# Patient Record
Sex: Male | Born: 1955 | Race: White | Hispanic: No | Marital: Married | Smoking: Former smoker
Health system: Southern US, Community
[De-identification: ages and names within clinical notes are randomized; demographics above are authoritative.]

## PROBLEM LIST (undated history)

## (undated) DIAGNOSIS — C801 Malignant (primary) neoplasm, unspecified: Secondary | ICD-10-CM

## (undated) DIAGNOSIS — I739 Peripheral vascular disease, unspecified: Secondary | ICD-10-CM

## (undated) DIAGNOSIS — M359 Systemic involvement of connective tissue, unspecified: Secondary | ICD-10-CM

## (undated) DIAGNOSIS — E119 Type 2 diabetes mellitus without complications: Secondary | ICD-10-CM

## (undated) DIAGNOSIS — Z87898 Personal history of other specified conditions: Secondary | ICD-10-CM

---

## 2003-09-01 ENCOUNTER — Emergency Department (HOSPITAL_COMMUNITY): Admission: EM | Admit: 2003-09-01 | Discharge: 2003-09-01 | Payer: Self-pay | Admitting: Emergency Medicine

## 2014-05-09 ENCOUNTER — Encounter (HOSPITAL_COMMUNITY): Payer: Self-pay | Admitting: Emergency Medicine

## 2014-05-09 ENCOUNTER — Emergency Department (HOSPITAL_COMMUNITY)
Admission: EM | Admit: 2014-05-09 | Discharge: 2014-05-09 | Disposition: A | Discharge status: Home / Self Care | Payer: Self-pay | Attending: Emergency Medicine | Admitting: Emergency Medicine

## 2014-05-09 DIAGNOSIS — R55 Syncope and collapse: Secondary | ICD-10-CM | POA: Insufficient documentation

## 2014-05-09 DIAGNOSIS — Z720 Tobacco use: Secondary | ICD-10-CM | POA: Insufficient documentation

## 2014-05-09 LAB — BASIC METABOLIC PANEL WITH GFR
Anion gap: 8 (ref 5–15)
BUN: 9 mg/dL (ref 6–23)
CO2: 25 mmol/L (ref 19–32)
Calcium: 8.7 mg/dL (ref 8.4–10.5)
Chloride: 101 meq/L (ref 96–112)
Creatinine, Ser: 0.73 mg/dL (ref 0.50–1.35)
GFR calc Af Amer: >90 mL/min (ref >90)
GFR calc non Af Amer: >90 mL/min (ref >90)
Glucose, Bld: 115 mg/dL — ABNORMAL HIGH (ref 70–99)
Potassium: 4.4 mmol/L (ref 3.5–5.1)
Sodium: 134 mmol/L — ABNORMAL LOW (ref 135–145)

## 2014-05-09 LAB — CBC WITH DIFFERENTIAL/PLATELET
BASOS PCT: 0 % (ref 0–1)
Basophils Absolute: 0 10*3/uL (ref 0.0–0.1)
EOS PCT: 0 % (ref 0–5)
Eosinophils Absolute: 0.1 10*3/uL (ref 0.0–0.7)
HEMATOCRIT: 39.5 % (ref 39.0–52.0)
HEMOGLOBIN: 13.4 g/dL (ref 13.0–17.0)
LYMPHS ABS: 1.3 10*3/uL (ref 0.7–4.0)
LYMPHS PCT: 8 % — AB (ref 12–46)
MCH: 30.7 pg (ref 26.0–34.0)
MCHC: 33.9 g/dL (ref 30.0–36.0)
MCV: 90.6 fL (ref 78.0–100.0)
MONO ABS: 0.9 10*3/uL (ref 0.1–1.0)
MONOS PCT: 6 % (ref 3–12)
Neutro Abs: 13.2 10*3/uL — ABNORMAL HIGH (ref 1.7–7.7)
Neutrophils Relative %: 86 % — ABNORMAL HIGH (ref 43–77)
PLATELETS: 234 10*3/uL (ref 150–400)
RBC: 4.36 MIL/uL (ref 4.22–5.81)
RDW: 13.7 % (ref 11.5–15.5)
WBC: 15.4 10*3/uL — AB (ref 4.0–10.5)

## 2014-05-09 MED ORDER — SODIUM CHLORIDE 0.9 % IV BOLUS (SEPSIS)
1000.0000 mL | Freq: Once | INTRAVENOUS | Status: AC
  Administered 2014-05-09: 1000 mL via INTRAVENOUS

## 2014-05-09 NOTE — ED Notes (Signed)
Per EMS-states syncopal episode around 1200-states he walked to store felt dizzy and fell backwards and "passed out"-IV placed and fluids started-heavy smoker, homeless

## 2014-05-09 NOTE — Discharge Instructions (Signed)
Get plenty of rest and drink plenty of fluids the next several days.  Return to the emergency department if you develop any new and concerning symptoms.   Syncope Syncope is a medical term for fainting or passing out. This means you lose consciousness and drop to the ground. People are generally unconscious for less than 5 minutes. You may have some muscle twitches for up to 15 seconds before waking up and returning to normal. Syncope occurs more often in older adults, but it can happen to anyone. While most causes of syncope are not dangerous, syncope can be a sign of a serious medical problem. It is important to seek medical care.  CAUSES  Syncope is caused by a sudden drop in blood flow to the brain. The specific cause is often not determined. Factors that can bring on syncope include:  Taking medicines that lower blood pressure.  Sudden changes in posture, such as standing up quickly.  Taking more medicine than prescribed.  Standing in one place for too long.  Seizure disorders.  Dehydration and excessive exposure to heat.  Low blood sugar (hypoglycemia).  Straining to have a bowel movement.  Heart disease, irregular heartbeat, or other circulatory problems.  Fear, emotional distress, seeing blood, or severe pain. SYMPTOMS  Right before fainting, you may:  Feel dizzy or light-headed.  Feel nauseous.  See all white or all black in your field of vision.  Have cold, clammy skin. DIAGNOSIS  Your health care provider will ask about your symptoms, perform a physical exam, and perform an electrocardiogram (ECG) to record the electrical activity of your heart. Your health care provider may also perform other heart or blood tests to determine the cause of your syncope which may include:  Transthoracic echocardiogram (TTE). During echocardiography, sound waves are used to evaluate how blood flows through your heart.  Transesophageal echocardiogram (TEE).  Cardiac monitoring.  This allows your health care provider to monitor your heart rate and rhythm in real time.  Holter monitor. This is a portable device that records your heartbeat and can help diagnose heart arrhythmias. It allows your health care provider to track your heart activity for several days, if needed.  Stress tests by exercise or by giving medicine that makes the heart beat faster. TREATMENT  In most cases, no treatment is needed. Depending on the cause of your syncope, your health care provider may recommend changing or stopping some of your medicines. HOME CARE INSTRUCTIONS  Have someone stay with you until you feel stable.  Do not drive, use machinery, or play sports until your health care provider says it is okay.  Keep all follow-up appointments as directed by your health care provider.  Lie down right away if you start feeling like you might faint. Breathe deeply and steadily. Wait until all the symptoms have passed.  Drink enough fluids to keep your urine clear or pale yellow.  If you are taking blood pressure or heart medicine, get up slowly and take several minutes to sit and then stand. This can reduce dizziness. SEEK IMMEDIATE MEDICAL CARE IF:   You have a severe headache.  You have unusual pain in the chest, abdomen, or back.  You are bleeding from your mouth or rectum, or you have black or tarry stool.  You have an irregular or very fast heartbeat.  You have pain with breathing.  You have repeated fainting or seizure-like jerking during an episode.  You faint when sitting or lying down.  You have confusion.  You have trouble walking.  You have severe weakness.  You have vision problems. If you fainted, call your local emergency services (911 in [place] Do not drive yourself to the hospital.  MAKE SURE YOU:  Understand these instructions.  Will watch your condition.  Will get help right away if you are not doing well or get worse. Document Released: 04/06/2005  Document Revised: 04/11/2013 Document Reviewed: 06/05/2011 [facility] Patient Information 2015 [place], [place]. This information is not intended to replace advice given to you by your health care provider. Make sure you discuss any questions you have with your health care provider.

## 2014-05-09 NOTE — ED Provider Notes (Signed)
CSN: [id number]     Arrival date & time 05/09/14  1253 History   First MD Initiated Contact with Patient 05/09/14 1317     Chief Complaint  Patient presents with  . Near Syncope     (Consider location/radiation/quality/duration/timing/severity/associated sxs/prior Treatment) HPI Comments: Patient is a 59 year old male with no significant past medical history. He presents with complaints of a syncopal episode. He states that he was in a store and walked outside and sat down in the cold when he suddenly "fell out". He had a several second loss of consciousness and woke up shortly thereafter. He denies any chest pain, palpitations, dizziness, shortness of breath, or other complaints.  Patient is a 59 y.o. male presenting with near-syncope. The history is provided by the patient.  Near Syncope This is a new problem. The current episode started less than 1 hour ago. The problem occurs constantly. The problem has not changed since onset.Pertinent negatives include no chest pain, no abdominal pain and no shortness of breath. Nothing aggravates the symptoms. Nothing relieves the symptoms. He has tried nothing for the symptoms. The treatment provided no relief.    History reviewed. No pertinent past medical history. History reviewed. No pertinent past surgical history. No family history on file. History  Substance Use Topics  . Smoking status: Current Every Day Smoker  . Smokeless tobacco: Not on file  . Alcohol Use: No    Review of Systems  Respiratory: Negative for shortness of breath.   Cardiovascular: Positive for near-syncope. Negative for chest pain.  Gastrointestinal: Negative for abdominal pain.  All other systems reviewed and are negative.     Allergies  Review of patient's allergies indicates no known allergies.  Home Medications   Prior to Admission medications   Not on File   BP 119/67 mmHg  Pulse 73  Temp(Src) 97.7 F (36.5 C) (Oral)  Resp 20  SpO2 93% Physical  Exam  Constitutional: He is oriented to person, place, and time. He appears well-developed and well-nourished. No distress.  HENT:  Head: Normocephalic and atraumatic.  Mouth/Throat: Oropharynx is clear and moist.  Eyes: EOM are normal. Pupils are equal, round, and reactive to light.  Neck: Normal range of motion. Neck supple.  Cardiovascular: Normal rate, regular rhythm and normal heart sounds.   No murmur heard. Pulmonary/Chest: Effort normal and breath sounds normal. No respiratory distress. He has no wheezes.  Abdominal: Soft. Bowel sounds are normal. He exhibits no distension. There is no tenderness.  Musculoskeletal: Normal range of motion. He exhibits no edema.  Lymphadenopathy:    He has no cervical adenopathy.  Neurological: He is alert and oriented to person, place, and time. No cranial nerve deficit. He exhibits normal muscle tone. Coordination normal.  Skin: Skin is warm and dry. He is not diaphoretic.  Nursing note and vitals reviewed.   ED Course  Procedures (including critical care time) Labs Review Labs Reviewed  CBC WITH DIFFERENTIAL  BASIC METABOLIC PANEL    Imaging Review No results found.   Date: 05/09/2014  Rate: 75  Rhythm: normal sinus rhythm  QRS Axis: normal  Intervals: normal  ST/T Wave abnormalities: normal  Conduction Disutrbances:none  Narrative Interpretation:   Old EKG Reviewed: none available    MDM   Final diagnoses:  None    Patient is a 59 year old male who presents for evaluation of a syncopal episode that occurred this afternoon. This appears to be some form of vasovagal syncope as I cannot find another etiology. His EKG, labs  are unremarkable. At this point I feel as though he is appropriate for discharge, to follow-up as needed for any problems.    Veryl Speak, MD 05/09/14 [phone]

## 2014-05-09 NOTE — ED Notes (Signed)
Bed: YT03 Expected date:  Expected time:  Means of arrival:  Comments: EMS-syncope

## 2014-05-13 ENCOUNTER — Encounter (HOSPITAL_COMMUNITY): Payer: Self-pay | Admitting: Physical Medicine and Rehabilitation

## 2014-05-13 ENCOUNTER — Observation Stay (HOSPITAL_COMMUNITY)
Admission: EM | Admit: 2014-05-13 | Discharge: 2014-05-16 | Disposition: A | Discharge status: Home / Self Care | Payer: Medicaid Other | Attending: Internal Medicine | Admitting: Internal Medicine

## 2014-05-13 ENCOUNTER — Emergency Department (HOSPITAL_COMMUNITY): Payer: Medicaid Other

## 2014-05-13 ENCOUNTER — Observation Stay (HOSPITAL_COMMUNITY): Payer: Medicaid Other

## 2014-05-13 DIAGNOSIS — D649 Anemia, unspecified: Secondary | ICD-10-CM | POA: Diagnosis not present

## 2014-05-13 DIAGNOSIS — I745 Embolism and thrombosis of iliac artery: Secondary | ICD-10-CM | POA: Diagnosis not present

## 2014-05-13 DIAGNOSIS — I70211 Atherosclerosis of native arteries of extremities with intermittent claudication, right leg: Secondary | ICD-10-CM | POA: Diagnosis not present

## 2014-05-13 DIAGNOSIS — E43 Unspecified severe protein-calorie malnutrition: Secondary | ICD-10-CM | POA: Insufficient documentation

## 2014-05-13 DIAGNOSIS — D72829 Elevated white blood cell count, unspecified: Secondary | ICD-10-CM | POA: Diagnosis not present

## 2014-05-13 DIAGNOSIS — F17210 Nicotine dependence, cigarettes, uncomplicated: Secondary | ICD-10-CM | POA: Diagnosis not present

## 2014-05-13 DIAGNOSIS — R739 Hyperglycemia, unspecified: Secondary | ICD-10-CM | POA: Diagnosis not present

## 2014-05-13 DIAGNOSIS — R55 Syncope and collapse: Secondary | ICD-10-CM | POA: Diagnosis not present

## 2014-05-13 DIAGNOSIS — R05 Cough: Secondary | ICD-10-CM | POA: Diagnosis not present

## 2014-05-13 DIAGNOSIS — M549 Dorsalgia, unspecified: Secondary | ICD-10-CM

## 2014-05-13 DIAGNOSIS — E1165 Type 2 diabetes mellitus with hyperglycemia: Secondary | ICD-10-CM | POA: Diagnosis not present

## 2014-05-13 DIAGNOSIS — R918 Other nonspecific abnormal finding of lung field: Secondary | ICD-10-CM

## 2014-05-13 DIAGNOSIS — Z23 Encounter for immunization: Secondary | ICD-10-CM | POA: Diagnosis not present

## 2014-05-13 HISTORY — DX: Personal history of other specified conditions: Z87.898

## 2014-05-13 LAB — BASIC METABOLIC PANEL
Anion gap: 7 (ref 5–15)
BUN: 10 mg/dL (ref 6–23)
CO2: 27 mmol/L (ref 19–32)
CREATININE: 0.8 mg/dL (ref 0.50–1.35)
Calcium: 8.6 mg/dL (ref 8.4–10.5)
Chloride: 101 mmol/L (ref 96–112)
GFR calc Af Amer: >90 mL/min (ref >90)
Glucose, Bld: 151 mg/dL — ABNORMAL HIGH (ref 70–99)
POTASSIUM: 3.8 mmol/L (ref 3.5–5.1)
SODIUM: 135 mmol/L (ref 135–145)

## 2014-05-13 LAB — I-STAT CHEM 8, ED
BUN: 12 mg/dL (ref 6–23)
CALCIUM ION: 1.19 mmol/L (ref 1.12–1.23)
CHLORIDE: 99 mmol/L (ref 96–112)
Creatinine, Ser: 0.7 mg/dL (ref 0.50–1.35)
Glucose, Bld: 151 mg/dL — ABNORMAL HIGH (ref 70–99)
HCT: 43 % (ref 39.0–52.0)
HEMOGLOBIN: 14.6 g/dL (ref 13.0–17.0)
POTASSIUM: 3.8 mmol/L (ref 3.5–5.1)
Sodium: 137 mmol/L (ref 135–145)
TCO2: 22 mmol/L (ref 0–100)

## 2014-05-13 LAB — URINALYSIS, ROUTINE W REFLEX MICROSCOPIC
BILIRUBIN URINE: NEGATIVE
Glucose, UA: NEGATIVE mg/dL
HGB URINE DIPSTICK: NEGATIVE
Ketones, ur: NEGATIVE mg/dL
Leukocytes, UA: NEGATIVE
NITRITE: NEGATIVE
PH: 6 (ref 5.0–8.0)
Protein, ur: NEGATIVE mg/dL
Specific Gravity, Urine: 1.013 (ref 1.005–1.030)
UROBILINOGEN UA: 1 mg/dL (ref 0.0–1.0)

## 2014-05-13 LAB — I-STAT TROPONIN, ED: Troponin i, poc: 0 ng/mL (ref 0.00–0.08)

## 2014-05-13 LAB — CBC WITH DIFFERENTIAL/PLATELET
Basophils Absolute: 0 10*3/uL (ref 0.0–0.1)
Basophils Relative: 0 % (ref 0–1)
Eosinophils Absolute: 0.1 10*3/uL (ref 0.0–0.7)
Eosinophils Relative: 1 % (ref 0–5)
HCT: 37.8 % — ABNORMAL LOW (ref 39.0–52.0)
Hemoglobin: 12.6 g/dL — ABNORMAL LOW (ref 13.0–17.0)
LYMPHS PCT: 14 % (ref 12–46)
Lymphs Abs: 1.9 10*3/uL (ref 0.7–4.0)
MCH: 29.6 pg (ref 26.0–34.0)
MCHC: 33.3 g/dL (ref 30.0–36.0)
MCV: 88.7 fL (ref 78.0–100.0)
Monocytes Absolute: 0.6 10*3/uL (ref 0.1–1.0)
Monocytes Relative: 4 % (ref 3–12)
NEUTROS ABS: 11.2 10*3/uL — AB (ref 1.7–7.7)
NEUTROS PCT: 81 % — AB (ref 43–77)
Platelets: 247 10*3/uL (ref 150–400)
RBC: 4.26 MIL/uL (ref 4.22–5.81)
RDW: 13.8 % (ref 11.5–15.5)
WBC: 13.8 10*3/uL — AB (ref 4.0–10.5)

## 2014-05-13 LAB — TROPONIN I

## 2014-05-13 LAB — GLUCOSE, CAPILLARY: Glucose-Capillary: 99 mg/dL (ref 70–99)

## 2014-05-13 MED ORDER — ONDANSETRON HCL 4 MG/2ML IJ SOLN: 4.0000 mg | Freq: Four times a day (QID) | INTRAMUSCULAR | Status: DC | PRN

## 2014-05-13 MED ORDER — INSULIN ASPART 100 UNIT/ML ~~LOC~~ SOLN
0.0000 [IU] | Freq: Three times a day (TID) | SUBCUTANEOUS | Status: DC
  Administered 2014-05-15: 1 [IU] via SUBCUTANEOUS

## 2014-05-13 MED ORDER — ENSURE COMPLETE PO LIQD
237.0000 mL | Freq: Two times a day (BID) | ORAL | Status: DC
  Administered 2014-05-14 – 2014-05-16 (×3): 237 mL via ORAL

## 2014-05-13 MED ORDER — PNEUMOCOCCAL VAC POLYVALENT 25 MCG/0.5ML IJ INJ
0.5000 mL | INJECTION | INTRAMUSCULAR | Status: AC
  Administered 2014-05-14: 0.5 mL via INTRAMUSCULAR
  Filled 2014-05-13: qty 0.5

## 2014-05-13 MED ORDER — ENOXAPARIN SODIUM 40 MG/0.4ML ~~LOC~~ SOLN
40.0000 mg | SUBCUTANEOUS | Status: DC
  Administered 2014-05-13 – 2014-05-15 (×3): 40 mg via SUBCUTANEOUS
  Filled 2014-05-13 (×4): qty 0.4

## 2014-05-13 MED ORDER — SODIUM CHLORIDE 0.9 % IJ SOLN
3.0000 mL | Freq: Two times a day (BID) | INTRAMUSCULAR | Status: DC
  Administered 2014-05-13 – 2014-05-16 (×5): 3 mL via INTRAVENOUS

## 2014-05-13 MED ORDER — INSULIN ASPART 100 UNIT/ML ~~LOC~~ SOLN: 0.0000 [IU] | Freq: Every day | SUBCUTANEOUS | Status: DC

## 2014-05-13 MED ORDER — POLYETHYLENE GLYCOL 3350 17 G PO PACK
17.0000 g | PACK | Freq: Every day | ORAL | Status: DC | PRN
  Filled 2014-05-13: qty 1

## 2014-05-13 MED ORDER — INFLUENZA VAC SPLIT QUAD 0.5 ML IM SUSY
0.5000 mL | PREFILLED_SYRINGE | INTRAMUSCULAR | Status: AC
  Administered 2014-05-14: 0.5 mL via INTRAMUSCULAR
  Filled 2014-05-13: qty 0.5

## 2014-05-13 MED ORDER — OXYCODONE HCL 5 MG PO TABS: 5.0000 mg | ORAL_TABLET | ORAL | Status: DC | PRN

## 2014-05-13 MED ORDER — PANTOPRAZOLE SODIUM 20 MG PO TBEC
20.0000 mg | DELAYED_RELEASE_TABLET | Freq: Every day | ORAL | Status: DC
  Administered 2014-05-13 – 2014-05-16 (×4): 20 mg via ORAL
  Filled 2014-05-13 (×4): qty 1

## 2014-05-13 MED ORDER — NICOTINE 21 MG/24HR TD PT24
21.0000 mg | MEDICATED_PATCH | Freq: Every day | TRANSDERMAL | Status: DC
  Administered 2014-05-13 – 2014-05-16 (×4): 21 mg via TRANSDERMAL
  Filled 2014-05-13 (×4): qty 1

## 2014-05-13 MED ORDER — SODIUM CHLORIDE 0.9 % IV SOLN
INTRAVENOUS | Status: DC
  Administered 2014-05-13 – 2014-05-15 (×3): via INTRAVENOUS

## 2014-05-13 MED ORDER — ONDANSETRON HCL 4 MG PO TABS: 4.0000 mg | ORAL_TABLET | Freq: Four times a day (QID) | ORAL | Status: DC | PRN

## 2014-05-13 MED ORDER — SODIUM CHLORIDE 0.9 % IV BOLUS (SEPSIS)
1000.0000 mL | Freq: Once | INTRAVENOUS | Status: AC
  Administered 2014-05-13: 1000 mL via INTRAVENOUS

## 2014-05-13 MED ORDER — ASPIRIN EC 81 MG PO TBEC
81.0000 mg | DELAYED_RELEASE_TABLET | Freq: Every day | ORAL | Status: DC
  Administered 2014-05-13 – 2014-05-15 (×3): 81 mg via ORAL
  Filled 2014-05-13 (×4): qty 1

## 2014-05-13 MED ORDER — ALUM & MAG HYDROXIDE-SIMETH 200-200-20 MG/5ML PO SUSP: 30.0000 mL | Freq: Four times a day (QID) | ORAL | Status: DC | PRN

## 2014-05-13 NOTE — H&P (Addendum)
[facility] History and Physical  DUWANE GEWIRTZ BSW:[id number] DOB: 1955-04-24 DOA: 05/13/2014  Referring physician: ED physician PCP: No PCP Per Patient   Chief Complaint: Passes out while sitting  HPI:  Dustin Freeman is a 59yo man with PMH of tobacco use (no recent PCP visit) who presents for passing out/syncope. [name] reports that he was walking to work today (usually walks 1 mile to work at [employer] to and back daily) when his leg started to feel numb on the right and he sat down to rest. Upon sitting, he noticed his vision went white and the next thing he knew he was face down on the ice.  Per his wife, he hit his head on some ice (she was not present, this was reported by the patient).  She helped him back to sitting when he passed out again.  This happened twice and EMS was called.  The patient remembers the intervening times, but was groggy.  There was no report of jerking movements or bowel or bladder loss.  Mr. [name] has been having a myriad of symptoms for the last few months, the most concerning being pain with walking in the right leg as well as occasional numbness of the right leg.  He reports occasional back pain, but not associated with leg pain.  He presented to the ED on 1/20 of this year with almost the exact same presentation.  He is a smoker since he was 11, about 1/2 pack per day.  His father had severe circulation issues in his legs requiring amputation of both legs.  During his last surgery, he had a cardiac arrest and passed away.  He does not have any FM of cardiomyopathy, early death from heart disease or palpitations that he knows of.  He has been told his heart occasionally "skips a beat" however, this was years ago on his [employer] entrance exam.  He has not seen a physician in over 10 years. He specifically denied chest pain.   [name] has other symptoms including a 2 month history of SOB with activity and at rest which results in coughing fits and emesis of  recent eaten food.  He has associated sharp abdominal pain with the coughing and nausea.  This happens about twice a week.  He denies any hemetemesis or hemoptysis, however, he does cough up "congestion" regularly.  He has had no fever or chills, no sick contacts.  He further reports decreased PO intake in the same time period, but does not weigh himself so is not sure if he has lost weight.  No night sweats reported.  He further notes over the same time period 3-4 times per week having very dark stools.  Further notes that the pain in his leg radiates to his scrotum on occasion, but starts in his calf.  I have reviewed his PMH and PSH with him as well as social history and family history, these were updated below.  He does not take any medications and denies ETOH and illicit drug use.   Assessment and Plan:  Syncope and collapse: Unclear cause at this time.  Neurocardiogenic is less likely given that he was sitting at the time of collapse.  Seizure less likely given no reported seizure like activity and no obvious post-ictal state.  Arrhythmia needs to be evaluated given his history of palpitations, but prodrome makes this somewhat less likely.  Also should evaluated for ACS given apparent claudication - Admit to telemetry for evaluation of heart  rhythm - EKG with unsteady baseline, hard to interpret, but no obvious BBB or ST elevations.  Will repeat in the AM - Troponin X 3 - Aspirin - Lipid panel, A1C ordered - Start heparin and repeat EKG for any change to troponin - CT head as noted, no acute change  Claudication/decreased pulses in the RLE - ABI's ordered, neurovascular checks q8 hours - Consider Vascular surgery consult in the AM - He reports no back pain or shooting pains in the leg (appears to arise in the calf), but consider lumbar spine MRI for any changes or if he reports back pain - Aspirin and lipid panel as noted above - Oxy IR for pain until I get his LFTs back, can change to  norco after that.  Given concern for possible bleeding (see below), avoid NSAIDs  Leukocytosis - He has had an elevated WBC with neutrophil predominance both time checked, he reports no urinary symptoms - CBC in the AM to trend - Check UA - Possibly due to changes on CXR as noted below    Hyperglycemia - BS in the 150s, he reports not eating much - Check A1C - Heart healthy diet, change to carb modified if A1C elevated    Anemia, normocytic with dark stools - Reports dark stools for the past few months, Hgb 12 (unclear baseline) - Iron panel, ferritin, LFTs pending - Hemocult stools - Trend CBC - Start protonix.   Cough, congestion, abnormal CXR - CXR with concern for abnormality in the hilum, will repeat PA/lateral in the AM - If wheezing or SOB, start O2 and nebulizers - Nicotine patch given long term smoking - Advised smoking cessation.  - Zofran for nausea    Radiological Exams on Admission: Ct Head Wo Contrast  05/13/2014   CLINICAL DATA:  Three syncopal episodes  EXAM: CT HEAD WITHOUT CONTRAST  TECHNIQUE: Contiguous axial images were obtained from the base of the skull through the vertex without intravenous contrast.  COMPARISON:  None.  FINDINGS: No skull fracture is noted. Mild mucosal thickening posterior aspect of the right maxillary sinus. The mastoid air cells are unremarkable. There is mucosal thickening with air-fluid level posterior aspect of the left sphenoid sinus. Mild atherosclerotic calcifications of carotid siphon.  No intracranial hemorrhage, mass effect or midline shift.  No acute cortical infarction. Mild cerebral atrophy. No mass lesion is noted on this unenhanced scan.  IMPRESSION: No acute intracranial abnormality. Mild cerebral atrophy. Paranasal sinuses disease as described above.   Electronically Signed   By: Lahoma Crocker M.D.   On: 05/13/2014 17:32   Dg Chest Portable 1 View  05/13/2014   CLINICAL DATA:  Syncope.  Nausea vomiting and SOB.  EXAM: PORTABLE  CHEST - 1 VIEW  COMPARISON:  None.  FINDINGS: Heart size is normal. There is increased fullness within the right hilar region, nonspecific. The lungs are hyperinflated and there are interstitial changes suggesting emphysema. There is no airspace consolidation.  IMPRESSION: 1. Prominent right hilar region is nonspecific. This may reflect pulmonary artery enlargement, adenopathy or right hilar mass. Recommend further evaluation with upright PA and lateral chest radiograph. 2. Emphysema.   Electronically Signed   By: Kerby Moors M.D.   On: 05/13/2014 16:17   Code Status: Full Family Communication: Pt at bedside Disposition Plan: Admit for further evaluation     Review of Systems:  Constitutional: Negative for fever, chills and malaise/fatigue. Negative for diaphoresis.  HENT: + for congestion. Negative for hearing loss, ear pain Eyes: Negative  for blurred vision, double vision, photophobia Respiratory: + for cough, SOB, sputum production.  Negative for hemoptysis, wheezing and stridor.   Cardiovascular: + for claudication. Negative for chest pain, palpitations, orthopnea and leg swelling.  Gastrointestinal: + for nausea, emesis, decreased PO intake, ? Melena, heartburn. Negative for constipation, blood in stool   Genitourinary: Negative for dysuria, urgency, frequency, hematuria and flank pain.  Musculoskeletal: + for fall, pain in leg, worse on the right. Negative for myalgias, back pain, joint pain  Skin: Negative for itching and rash.  Neurological: + for weakness of right leg, but more related to pain.  + for numbness of right leg. Negative for dizziness. Negative for tingling, tremors, speech change, focal weakness, loss of consciousness and headaches.  Endo/Heme/Allergies: Negative for environmental allergies and polydipsia. Does not bruise/bleed easily.  Psychiatric/Behavioral: + nervousness due to medical issues. Negative for suicidal ideas.     Past Medical History  Diagnosis Date  .  History of palpitations     During [employer] intake exam, not since   History reviewed. No pertinent past surgical history.  Social History:  reports that he has been smoking Cigarettes.  He has a 23.5 pack-year smoking history. He does not have any smokeless tobacco history on file. He reports that he does not drink alcohol or use illicit drugs.  No Known Allergies  Family History  Problem Relation Age of Onset  . Diabetes Mellitus I Mother   . Peripheral vascular disease Father   . Leukemia Maternal Grandfather   . Leukemia Maternal Uncle   . Kidney disease Cousin   . Breast cancer Cousin    Prior to Admission medications   Not on File    Physical Exam: Filed Vitals:   05/13/14 1528 05/13/14 1545 05/13/14 1716  BP: 112/67  109/69  Pulse: 76  86  Temp: 97.8 F (36.6 C) 98 F (36.7 C)   TempSrc: Oral    Resp: 20  22  SpO2: 100%  99%    Physical Exam  Constitutional: Thin man, appears older than stated age.  HENT: Normocephalic. Oropharynx is clear and moist.  Eyes: Conjunctivae and EOM are normal.  no scleral icterus.  Neck: Normal ROM. Neck supple.  CVS: RR, NR, S1, S2 +, no murmurs, no gallops, no carotid bruit. Decreased pulses in the RLE, cold to the touch compared to the left.  Left DP easily palpable.  Right PT very faint, no DP palpable.   Pulmonary: Effort and breath sounds normal, no stridor, rhonchi, wheezes, rales.  Abdominal: Soft. BS mildly decreased,  no distension, tenderness, rebound or guarding.  Musculoskeletal: Normal range of motion. No edema and no tenderness.  Neuro: Alert. Muscle tone normal. No cranial nerve deficit.  Strength normal, somewhat decreased effort on the right LE due to pain per patient.  He also had reported decreased sensation on the right LE, however, on exam, his sensation is equal to light touch.  Skin: Skin is warm and dry. He has very dry skin on bilateral LE and what appears to be either caked dirt or a fungal infection on the soles  of both feet and in between digits.  Psychiatric: Normal mood and affect. Behavior, judgment, thought content normal.  GU: No inguinal LAD  Labs on Admission:  Basic Metabolic Panel:  Recent Labs Lab 05/09/14 1341 05/13/14 1545 05/13/14 1600  NA 134* 135 137  K 4.4 3.8 3.8  CL 101 101 99  CO2 25 27  --   GLUCOSE 115* 151*  151*  BUN _0 CREATININE 0.73 0.80 0.70  CALCIUM 8.7 8.6  --    CBC:  Recent Labs Lab 05/09/14 1341 05/13/14 1545 05/13/14 1600  WBC 15.4* 13.8*  --   NEUTROABS 13.2* 11.2*  --   HGB 13.4 12.6* 14.6  HCT 39.5 37.8* 43.0  MCV 90.6 88.7  --   PLT 234 247  --    Cardiac Enzymes: Point of care troponin 0.00  EKG: Normal sinus rhythm, no obvious ST/T wave changes  Caid Radin, MD [phone]  If 7PM-7AM, please contact night-coverage [email or web address] Password [facility] 05/13/2014, 7:19 PM

## 2014-05-13 NOTE — ED Provider Notes (Signed)
CSN: [id number]     Arrival date & time 05/13/14  1523 History   First MD Initiated Contact with Patient 05/13/14 1525     Chief Complaint  Patient presents with  . Fall  . Loss of Consciousness  . Code STEMI     (Consider location/radiation/quality/duration/timing/severity/associated sxs/prior Treatment) HPI Comments: Patient walked a mile with his wife this afternoon.  He started feeling like his right leg would give out.  As a result he sat to rest on a wall when he developed the sensation that his head was spinning, he developed spotty vision and syncopized falling to hit his head on the ground.  He sat up on the wall two more times and each time syncopized impacting his head on the ground.  As a result his wife called EMS.    Patient is a 59 y.o. male presenting with syncope. The history is provided by the patient and the EMS personnel. No language interpreter was used.  Loss of Consciousness Episode history:  Multiple Most recent episode:  Today Timing:  Intermittent Progression:  Waxing and waning Chronicity:  New Context: sitting down   Context: not blood draw, not bowel movement, not dehydration, not exertion, not inactivity and not medication change   Context comment:  Patient had walked a mile and sat down to rest with his wife Witnessed: yes   Relieved by:  Lying down Worsened by:  Posture Ineffective treatments:  Sitting up Associated symptoms: no anxiety, no chest pain, no fever, no headaches, no malaise/fatigue, no nausea, no recent fall, no shortness of breath, no vomiting and no weakness     Past Medical History  Diagnosis Date  . History of palpitations     During [employer] intake exam, not since   History reviewed. No pertinent past surgical history. Family History  Problem Relation Age of Onset  . Diabetes Mellitus I Mother   . Peripheral vascular disease Father   . Leukemia Maternal Grandfather   . Leukemia Maternal Uncle   . Kidney disease Cousin   .  Breast cancer Cousin    History  Substance Use Topics  . Smoking status: Current Every Day Smoker -- 0.50 packs/day for 47 years    Types: Cigarettes  . Smokeless tobacco: Not on file  . Alcohol Use: No    Review of Systems  Constitutional: Negative for fever, chills and malaise/fatigue.  Respiratory: Negative for cough and shortness of breath.   Cardiovascular: Positive for syncope. Negative for chest pain.  Gastrointestinal: Negative for nausea and vomiting.  Genitourinary: Negative for dysuria, urgency and frequency.  Musculoskeletal: Negative for back pain and neck pain.  Neurological: Negative for weakness and headaches.  All other systems reviewed and are negative.     Allergies  Review of patient's allergies indicates no known allergies.  Home Medications   Prior to Admission medications   Not on File   BP 123/76 mmHg  Pulse 66  Temp(Src) 98.3 F (36.8 C) (Oral)  Resp 16  Wt 118 lb 3.2 oz (53.615 kg)  SpO2 99% Physical Exam  Constitutional: He is oriented to person, place, and time. He appears well-developed. He appears cachectic. No distress.  HENT:  Head: Normocephalic.  Right Ear: Tympanic membrane normal.  Left Ear: Tympanic membrane normal.  Nose: No nasal deformity, septal deviation or nasal septal hematoma.  Eyes: Pupils are equal, round, and reactive to light.  Neck: No spinous process tenderness and no muscular tenderness present.  Cardiovascular: Normal rate, regular rhythm and  normal heart sounds.   Pulses:      Radial pulses are 1+ on the right side, and 1+ on the left side.       Dorsalis pedis pulses are 0 on the right side, and 0 on the left side.       Posterior tibial pulses are 0 on the right side, and 0 on the left side.  Able to obtain a doppler pulse of the PT and DP on the left foot and of the PT on the right foot.  No dopplerable DP on the R foot.   Pulmonary/Chest: Effort normal. No respiratory distress. He has no wheezes. He  exhibits no tenderness, no laceration and no deformity.  Abdominal: Normal appearance. There is no tenderness. There is no rigidity, no rebound and no guarding.  Musculoskeletal:       Cervical back: He exhibits no tenderness, no bony tenderness, no deformity, no laceration and no pain.       Thoracic back: He exhibits no tenderness, no bony tenderness, no swelling, no deformity and no laceration.       Lumbar back: He exhibits no tenderness, no bony tenderness, no deformity and no laceration.  Neurological: He is alert and oriented to person, place, and time. He has normal strength. No cranial nerve deficit or sensory deficit.  Strength 5/5 bilateral upper and lower extremities.  Sensation intact x4 extremities.  CN II-XII intact.    Skin: Skin is warm and dry. He is not diaphoretic.  Nursing note and vitals reviewed.   ED Course  Procedures (including critical care time) Labs Review Labs Reviewed  CBC WITH DIFFERENTIAL/PLATELET - Abnormal; Notable for the following:    WBC 13.8 (*)    Hemoglobin 12.6 (*)    HCT 37.8 (*)    Neutrophils Relative % 81 (*)    Neutro Abs 11.2 (*)    All other components within normal limits  BASIC METABOLIC PANEL - Abnormal; Notable for the following:    Glucose, Bld 151 (*)    All other components within normal limits  I-STAT CHEM 8, ED - Abnormal; Notable for the following:    Glucose, Bld 151 (*)    All other components within normal limits  TROPONIN I  GLUCOSE, CAPILLARY  URINALYSIS, ROUTINE W REFLEX MICROSCOPIC  COMPREHENSIVE METABOLIC PANEL  CBC  PROTIME-INR  HEMOGLOBIN A1C  URINALYSIS, ROUTINE W REFLEX MICROSCOPIC  TROPONIN I  TROPONIN I  OCCULT BLOOD X 1 CARD TO LAB, STOOL  IRON AND TIBC  FERRITIN  LIPID PANEL  I-STAT TROPOININ, ED    Imaging Review X-ray Chest Pa And Lateral  05/13/2014   CLINICAL DATA:  Hilar enlargement. Left leg weakness. Patient passed out x2 today.  EXAM: CHEST  2 VIEW  COMPARISON:  05/13/2014  FINDINGS:  Normal heart size and pulmonary vascularity. Emphysematous changes in the lungs. Scattered central fibrosis, likely due to chronic bronchitis. Right hilum remains enlarged and irregular. Differential diagnosis includes central arterial enlargement, mass, or lymphadenopathy. CT suggested for further evaluation. No blunting of costophrenic angles. No pneumothorax. Degenerative changes in the spine.  IMPRESSION: Persistent nonspecific enlargement of right hilum. CT recommended for further evaluation. Emphysematous changes and scattered fibrosis in the lungs.   Electronically Signed   By: Lucienne Capers M.D.   On: 05/13/2014 21:38   Ct Head Wo Contrast  05/13/2014   CLINICAL DATA:  Three syncopal episodes  EXAM: CT HEAD WITHOUT CONTRAST  TECHNIQUE: Contiguous axial images were obtained from the base of  the skull through the vertex without intravenous contrast.  COMPARISON:  None.  FINDINGS: No skull fracture is noted. Mild mucosal thickening posterior aspect of the right maxillary sinus. The mastoid air cells are unremarkable. There is mucosal thickening with air-fluid level posterior aspect of the left sphenoid sinus. Mild atherosclerotic calcifications of carotid siphon.  No intracranial hemorrhage, mass effect or midline shift.  No acute cortical infarction. Mild cerebral atrophy. No mass lesion is noted on this unenhanced scan.  IMPRESSION: No acute intracranial abnormality. Mild cerebral atrophy. Paranasal sinuses disease as described above.   Electronically Signed   By: Lahoma Crocker M.D.   On: 05/13/2014 17:32   Dg Chest Portable 1 View  05/13/2014   CLINICAL DATA:  Syncope.  Nausea vomiting and SOB.  EXAM: PORTABLE CHEST - 1 VIEW  COMPARISON:  None.  FINDINGS: Heart size is normal. There is increased fullness within the right hilar region, nonspecific. The lungs are hyperinflated and there are interstitial changes suggesting emphysema. There is no airspace consolidation.  IMPRESSION: 1. Prominent right  hilar region is nonspecific. This may reflect pulmonary artery enlargement, adenopathy or right hilar mass. Recommend further evaluation with upright PA and lateral chest radiograph. 2. Emphysema.   Electronically Signed   By: Kerby Moors M.D.   On: 05/13/2014 16:17     EKG Interpretation None      MDM   Final diagnoses:  Syncope  Leukocytosis  Hyperglycemia  Anemia, unspecified anemia type    Patient is a 59 year old Caucasian male with no pertinent past medical history but no previous medical evaluations who comes to the emergency department today with a syncopal event. Physical exam as above. Patient does not have any focal neurologic deficits at this time. He does have a decreased pulse in his bilateral lower extremities we were able to obtain a Doppler of these as result I do not feel that he has an acute vascular emergency. Patient symptoms were likely secondary to claudication.  This is concerning that he may have coronary artery disease or other vascular abnormalities as well.  Initial workup included a UA, troponin, CBC, BMP, chest x-ray, and a CT of his head.  CT demonstrated no acute abnormality. Chest x-ray was unremarkable with the exception of a prominent right hilar region. There were no consolidations making pneumonia unlikely. No widened mediastinum. Patient has equal pulses in his upper extremities as result I doubt a thoracic aortic dissection particularly since he does not have any chest pain. Troponin was negative. CBC with a white count of 13.8 otherwise unremarkable. BMP was unremarkable. With patient's multiple episodes of syncope today and a previous visit to the emergency department for similar complaint I feel that he requires admission to the hospital for further evaluation. The patient was admitted to the hospitalist service in good condition. Labs and imaging reviewed by myself and considered in medical decision making. Imaging was interpreted by radiology. Care was  discussed with my attending Dr. Wilson Singer.     Katheren Shams, MD 05/14/14 1228  Virgel Manifold, MD 05/16/14 [phone]

## 2014-05-13 NOTE — ED Notes (Signed)
Attempted to give report to 3E after speaking w/ admitting to physician that pt is not going to be stroke workup, states events of current admission more related to lower extremity claudication. Floor continues to request rapid response RN assessment prior to admission for pt appropriateness to floor. Hilliard Clark, RR-RN in ED currently w/ another pt and will evaluate pt shortly.

## 2014-05-13 NOTE — ED Provider Notes (Signed)
58yM with recurrent syncope. x3 today and similar episode about a week ago. Reportedly initially pale and diaphoretic. Nauseated. Currently no complaints aside form numbness R lower leg from knee down. EKG with nonspecific changes. Nonfocal neuro exam aside from decreased sensation to light touch RLE. With striking head, will CT. With recurrent episodes of syncope, will admit for further eval.   Pt reports having to rest because R leg "giving out" on him. Numbness from R hip down, worse below knee. Symptoms are precipitated with ambulation. Clinically he has significant PVD. Cap refill is sluggish in both feet, R>L. I cannot palpate R DP, PT or even femoral pulse. Cannot palpate L DP or PT pulse. L femoral pulse is strong. With doppler, can get pulses in all expect R DP. Motor intact. Cannot clearly relate to syncope but given degree or symptoms may need to be addressed during admission at discretion of admitting provider.   Virgel Manifold, MD 05/16/14 [phone]

## 2014-05-13 NOTE — ED Notes (Addendum)
Pt presents to department via [facility]. Pt reports syncopal episodes x3 today, occurred while walking, struck head on ground. Pt reports pain/weakness to R leg, nausea/vomiting and SOB. 18g LAC. CBG 118, 324 ASA per EMS. Pt is alert and oriented x4. Denies chest pain. Pt pale upon arrival to ED. Respirations unlabored, speaking complete sentences.

## 2014-05-13 NOTE — ED Notes (Addendum)
3E to have rapid response evaluate patient, will not take report at the time. Charge RN aware of this.

## 2014-05-13 NOTE — ED Notes (Signed)
Pt reports weakness/numbness to R leg. Able to wiggle digits.

## 2014-05-13 NOTE — ED Notes (Signed)
Attempted to call report x1. Nurse to call back.

## 2014-05-14 ENCOUNTER — Observation Stay (HOSPITAL_COMMUNITY): Payer: Medicaid Other

## 2014-05-14 DIAGNOSIS — D72829 Elevated white blood cell count, unspecified: Secondary | ICD-10-CM | POA: Diagnosis not present

## 2014-05-14 DIAGNOSIS — R55 Syncope and collapse: Secondary | ICD-10-CM

## 2014-05-14 DIAGNOSIS — R739 Hyperglycemia, unspecified: Secondary | ICD-10-CM | POA: Diagnosis not present

## 2014-05-14 DIAGNOSIS — I70211 Atherosclerosis of native arteries of extremities with intermittent claudication, right leg: Secondary | ICD-10-CM

## 2014-05-14 DIAGNOSIS — R0600 Dyspnea, unspecified: Secondary | ICD-10-CM

## 2014-05-14 LAB — GLUCOSE, CAPILLARY
Glucose-Capillary: 92 mg/dL (ref 70–99)
Glucose-Capillary: 127 mg/dL — ABNORMAL HIGH (ref 70–99)
Glucose-Capillary: 108 mg/dL — ABNORMAL HIGH (ref 70–99)

## 2014-05-14 LAB — LIPID PANEL
Cholesterol: 116 mg/dL (ref 0–200)
HDL: 31 mg/dL — ABNORMAL LOW (ref >39)
LDL CALC: 73 mg/dL (ref 0–99)
Total CHOL/HDL Ratio: 3.7 RATIO
Triglycerides: 60 mg/dL (ref <150)
VLDL: 12 mg/dL (ref 0–40)

## 2014-05-14 LAB — CBC
HEMATOCRIT: 35.7 % — AB (ref 39.0–52.0)
HEMOGLOBIN: 12.1 g/dL — AB (ref 13.0–17.0)
MCH: 30.2 pg (ref 26.0–34.0)
MCHC: 33.9 g/dL (ref 30.0–36.0)
MCV: 89 fL (ref 78.0–100.0)
PLATELETS: 265 10*3/uL (ref 150–400)
RBC: 4.01 MIL/uL — ABNORMAL LOW (ref 4.22–5.81)
RDW: 13.6 % (ref 11.5–15.5)
WBC: 9.8 10*3/uL (ref 4.0–10.5)

## 2014-05-14 LAB — COMPREHENSIVE METABOLIC PANEL
ALT: 8 U/L (ref 0–53)
AST: 13 U/L (ref 0–37)
Albumin: 2.7 g/dL — ABNORMAL LOW (ref 3.5–5.2)
Alkaline Phosphatase: 52 U/L (ref 39–117)
Anion gap: 4 — ABNORMAL LOW (ref 5–15)
BILIRUBIN TOTAL: 0.5 mg/dL (ref 0.3–1.2)
BUN: 9 mg/dL (ref 6–23)
CO2: 27 mmol/L (ref 19–32)
Calcium: 8.3 mg/dL — ABNORMAL LOW (ref 8.4–10.5)
Chloride: 108 mmol/L (ref 96–112)
Creatinine, Ser: 0.66 mg/dL (ref 0.50–1.35)
GFR calc Af Amer: >90 mL/min (ref >90)
GFR calc non Af Amer: >90 mL/min (ref >90)
GLUCOSE: 96 mg/dL (ref 70–99)
Potassium: 3.7 mmol/L (ref 3.5–5.1)
SODIUM: 139 mmol/L (ref 135–145)
TOTAL PROTEIN: 5.8 g/dL — AB (ref 6.0–8.3)

## 2014-05-14 LAB — PROTIME-INR
INR: 1.16 (ref 0.00–1.49)
Prothrombin Time: 14.9 seconds (ref 11.6–15.2)

## 2014-05-14 LAB — IRON AND TIBC
Iron: 68 ug/dL (ref 42–165)
SATURATION RATIOS: 32 % (ref 20–55)
TIBC: 212 ug/dL — AB (ref 215–435)
UIBC: 144 ug/dL (ref 125–400)

## 2014-05-14 LAB — FERRITIN: FERRITIN: 276 ng/mL (ref 22–322)

## 2014-05-14 LAB — HEMOGLOBIN A1C
Hgb A1c MFr Bld: 6 % — ABNORMAL HIGH (ref <5.7)
Mean Plasma Glucose: 126 mg/dL — ABNORMAL HIGH (ref <117)

## 2014-05-14 LAB — D-DIMER, QUANTITATIVE: D-Dimer, Quant: 0.34 ug/mL-FEU (ref 0.00–0.48)

## 2014-05-14 LAB — TROPONIN I: Troponin I: <0.03 ng/mL (ref <0.031)

## 2014-05-14 MED ORDER — PREGABALIN 25 MG PO CAPS
75.0000 mg | ORAL_CAPSULE | Freq: Two times a day (BID) | ORAL | Status: DC
  Administered 2014-05-14 – 2014-05-16 (×5): 75 mg via ORAL
  Filled 2014-05-14 (×5): qty 3

## 2014-05-14 NOTE — Progress Notes (Signed)
  Echocardiogram 2D Echocardiogram has been performed.  Donata Clay 05/14/2014, 4:58 PM

## 2014-05-14 NOTE — Progress Notes (Signed)
INITIAL NUTRITION ASSESSMENT  DOCUMENTATION CODES Per approved criteria  -Severe malnutrition in the context of chronic illness  Pt meets criteria for SEVERE MALNUTRITION in the context of CHRONIC ILLNESS as evidenced by estimated energy intake <75% of estimated needs for > 1 month, 17% weight loss in 6 months, and severe muscle wasting per physical exam.  INTERVENTION: Provide Ensure Complete BID, each supplement provides 350 kcal and 13 grams of protein Provide Snack once daily Encourage PO intake with protein-rich foods at each meal  NUTRITION DIAGNOSIS: Inadequate oral intake related to pain and decreased appetite as evidenced by 17.5% weight loss in 6 months per pt report.   Goal: Pt to meet >/= 90% of their estimated nutrition needs   Monitor:  PO intake, supplement acceptance, weight trend, labs  Reason for Assessment: Malnutrition Screening Tool, score of 2  59 y.o. male  Admitting Dx: Syncope  ASSESSMENT: 59 yo man with PMH of tobacco use (no recent PCP visit) who presents for passing out/syncope.   Pt reports that he used to weigh 143 lbs about 6 months ago but, for the past month he has had a lot of pain in his leg and has had a poor appetite. He reports eating about 25% compared to his usual intake; he hadn't realized how much weight he had lost until admitted to [facility]. He states that PTA he maybe would eat and amount of food equivalent to half a sandwich at each meal. Pt states that his appetite is much better today and he is eating well. Per nursing notes pt is eating 50-90% of meals. Pt has noticed he has become more weak and loss some of his muscle tone. He is agreeable to receiving nutritional supplements and snacks. RD encouraged pt to continue eating well and to aim for protein-rich foods at each meal.   Nutrition Focused Physical Exam:  Subcutaneous Fat:  Orbital Region: mild to moderate wasting Upper Arm Region: moderate wasting Thoracic and Lumbar Region:  NA  Muscle:  Temple Region: moderate to severe wasting Clavicle Bone Region: severe wasting Clavicle and Acromion Bone Region: moderate wasting Scapular Bone Region: NA Dorsal Hand: mild wasting Patellar Region: moderate wasting Anterior Thigh Region: moderate wasting Posterior Calf Region: moderate wasting   Edema: none   Height: Ht Readings from Last 1 Encounters:  05/13/14 5\' 6"  (1.676 m)    Weight: Wt Readings from Last 1 Encounters:  05/14/14 118 lb 6.2 oz (53.7 kg)    Ideal Body Weight: 154 lbs  % Ideal Body Weight: 77%  Wt Readings from Last 10 Encounters:  05/14/14 118 lb 6.2 oz (53.7 kg)    Usual Body Weight: 143 lbs  % Usual Body Weight: 83%  BMI:  Body mass index is 19.12 kg/(m^2).  Estimated Nutritional Needs: Kcal: 1700-1900 Protein: 65-75 grams Fluid: 1.7-1.9 L/day  Skin: WDL  Diet Order: Diet Heart  EDUCATION NEEDS: -No education needs identified at this time   Intake/Output Summary (Last 24 hours) at 05/14/14 1610 Last data filed at 05/14/14 1305  Gross per 24 hour  Intake 2426.75 ml  Output   1395 ml  Net 1031.75 ml    Last BM: 1/23  Labs:   Recent Labs Lab 05/09/14 1341 05/13/14 1545 05/13/14 1600 05/14/14 0249  NA 134* 135 137 139  K 4.4 3.8 3.8 3.7  CL 101 101 99 108  CO2 25 27  --  27  BUN 9 10 12 9   CREATININE 0.73 0.80 0.70 0.66  CALCIUM 8.7  8.6  --  8.3*  GLUCOSE 115* 151* 151* 96    CBG (last 3)   Recent Labs  05/13/14 2144 05/14/14 0655  GLUCAP 99 92    Scheduled Meds: . aspirin EC  81 mg Oral Daily  . enoxaparin (LOVENOX) injection  40 mg Subcutaneous Q24H  . feeding supplement (ENSURE COMPLETE)  237 mL Oral BID BM  . insulin aspart  0-5 Units Subcutaneous QHS  . insulin aspart  0-9 Units Subcutaneous TID WC  . nicotine  21 mg Transdermal Daily  . pantoprazole  20 mg Oral Daily  . pregabalin  75 mg Oral BID  . sodium chloride  3 mL Intravenous Q12H    Continuous Infusions: . sodium  chloride 75 mL/hr at 05/13/14 2221    Past Medical History  Diagnosis Date  . History of palpitations     During [employer] intake exam, not since    History reviewed. No pertinent past surgical history.  Pryor Ochoa RD, LDN Inpatient Clinical Dietitian Pager: [phone] After Hours Pager: [phone]

## 2014-05-14 NOTE — Progress Notes (Signed)
PT Cancellation Note  Patient Details Name: Dustin Freeman MRN: [id number] DOB: 1955-07-26   Cancelled Treatment:    Reason Eval/Treat Not Completed: Patient at procedure or test/unavailable (at MRI).   [name] 05/14/2014, 3:23 PM [name], [place] DPT  [phone]

## 2014-05-14 NOTE — Progress Notes (Signed)
Echocardiogram 2D Echocardiogram limited has been performed.  Dustin Freeman 05/14/2014, 12:31 PM

## 2014-05-14 NOTE — Progress Notes (Signed)
VASCULAR LAB PRELIMINARY  ARTERIAL  ABI completed:    RIGHT    LEFT    PRESSURE WAVEFORM  PRESSURE WAVEFORM  BRACHIAL 112 Triphasic BRACHIAL 113 Triphasic  DP 47 Dampened monophasic DP 120 Triphasic  PT 56 Dampened monphasic PT 111 Triphasic    RIGHT LEFT  ABI 0.50 1.06   Right ABI indicates a severe reduction in arterial flow with abnormal Doppler waveforms at rest. Left ABI and Doppler waveforms are within normal limits at rest.  Duplex scan of the right lower extremity revealed moderate to severe plaque formation in the proximal common femoral artery with dampened velocities. There also appears to be a greater than 50% stenosis in the common femoral artery. Dampened Doppler signals and velocities were noted throughout the lower extremity with evidence of mild heterogeneous plaque. The left common femoral artery flow is triphasic with minute plaque noted.  Dustin Freeman, RVS 05/14/2014, 4:13 PM

## 2014-05-14 NOTE — Progress Notes (Signed)
VASCULAR LAB PRELIMINARY  PRELIMINARY  PRELIMINARY  PRELIMINARY  Right lower extremity venous duplex completed.    Preliminary report:  Right:  No evidence of DVT, superficial thrombosis, or Baker's cyst.  Dustin Freeman, RVS 05/14/2014, 4:21 PM

## 2014-05-14 NOTE — Discharge Instructions (Signed)
[facility] Stay Proper nutrition can help your body recover from illness and injury.   Foods and beverages high in protein, vitamins, and minerals help rebuild muscle loss, promote healing, & reduce fall risk.   In addition to eating healthy foods, a nutrition shake is an easy, delicious way to get the nutrition you need during and after your hospital stay  It is recommended that you continue to drink 2 bottles per day of:       Ensure Plus for at least 1 month (30 days) after your hospital stay   Tips for adding a nutrition shake into your routine: As allowed, drink one with vitamins or medications instead of water or juice Enjoy one as a tasty mid-morning or afternoon snack Drink cold or make a milkshake out of it Drink one instead of milk with cereal or snacks Use as a coffee creamer   Available at the following grocery stores and pharmacies:           * [place] [phone]            For COUPONS visit: www.ensure.com/join or http://dawson-may.com/   Suggested Substitutions Ensure Plus = Boost Plus = Carnation Breakfast Essentials = Boost Compact

## 2014-05-14 NOTE — Progress Notes (Signed)
No change from previous assessment. Will continue to monitor patient.

## 2014-05-14 NOTE — Progress Notes (Signed)
UR completed 

## 2014-05-14 NOTE — Progress Notes (Signed)
Patient transferred from ED to [place] by stretcher.  Complaints of numbness and tingling in his right leg from ankle to right testicle.  No pain noted.  Vital signs stable.  Will continue to monitor for changes.

## 2014-05-14 NOTE — Progress Notes (Signed)
Patient ID: Dustin Freeman  male  PCH:[id number]    DOB: Sep 21, 1955    DOA: 05/13/2014  PCP: No PCP Per Patient  Brief history of present illness  [name] is a 59yo man with PMH of tobacco use (no recent PCP visit) who presents for passing out/syncope. [name] reports that he was walking to work today (usually walks 1 mile to work at [employer] to and back daily) when his leg started to feel numb on the right and he sat down to rest. Upon sitting, he noticed his vision went white and the next thing he knew he was face down on the ice. Per his wife, he hit his head on some ice (she was not present, this was reported by the patient). She helped him back to sitting when he passed out again. This happened twice and EMS was called. The patient remembers the intervening times, but was groggy. There was no report of jerking movements or bowel or bladder loss. [name] has been having a myriad of symptoms for the last few months, the most concerning being pain with walking in the right leg as well as occasional numbness of the right leg. He reports occasional back pain, but not associated with leg pain. He presented to the ED on 1/20 of this year with almost the exact same presentation. He is a smoker since he was 11, about 1/2 pack per day. His father had severe circulation issues in his legs requiring amputation of both legs. During his last surgery, he had a cardiac arrest and passed away. He does not have any FM of cardiomyopathy, early death from heart disease or palpitations that he knows of. He has been told his heart occasionally "skips a beat" however, this was years ago on his [employer] entrance exam. He has not seen a physician in over 10 years. He specifically denied chest pain.  [name] has other symptoms including a 2 month history of SOB with activity and at rest which results in coughing fits and emesis of recent eaten food. He has associated sharp abdominal pain with the  coughing and nausea. This happens about twice a week. He denies any hemetemesis or hemoptysis, however, he does cough up "congestion" regularly. He has had no fever or chills, no sick contacts. He further reports decreased PO intake in the same time period, but does not weigh himself so is not sure if he has lost weight. No night sweats reported. He further notes over the same time period 3-4 times per week having very dark stools. Further notes that the pain in his leg radiates to his scrotum on occasion, but starts in his calf.   Assessment/Plan: Principal Problem:   Syncope: Unclear etiology, patient has not followed with PCP in 10 years possibly vasovagal versus "right leg gave away due to claudication " - Rule out for acute ACS, EKG reviewed, rate 71, no acute ST-T wave changes suggestive of ischemia, 2-D echo ordered (Dyspnea upon exertion, syncope) - Continue aspirin, check lipid panel, hemoglobin A1c - MRI of the brain negative for acute stroke  Active Problems: Right lower extremities numbness and tingling/claudication - ABIs ordered, also check Doppler ultrasound of the lower extremity to rule out any DVT - MRI of the lumbar spine showed mild degenerative disc disease - will consult vascular surgery pending ABI results  - Patient counseled strongly to quit smoking. - Continue aspirin, placed on Lyrica    Leukocytosis: Likely stress de-margination, resolved  Hyperglycemia - Hemoglobin A1c pending    Anemia: Likely anemia of chronic disease, patient denies any melanotic stools during my encounter - Iron studies within normal limits, stool occult test pending   DVT Prophylaxis: lovenox  Code Status: FC  Family Communication:  Disposition:  Consultants:  none   Procedures:  MRI of the brain, MRI of the lumbar spine   Antibiotics:  None    Subjective: Patient seen and examined, reports numbness and tingling in the right leg, pain   Objective: Weight  change:   Intake/Output Summary (Last 24 hours) at 05/14/14 1246 Last data filed at 05/14/14 0955  Gross per 24 hour  Intake 2186.75 ml  Output    670 ml  Net 1516.75 ml   Blood pressure 107/64, pulse 70, temperature 98.2 F (36.8 C), temperature source Oral, resp. rate 18, height _0  (1.676 m), weight 53.7 kg (118 lb 6.2 oz), SpO2 98 %.  Physical Exam: General: Alert and awake, oriented x3, not in any acute distress. HEENT: anicteric sclera, PERLA, EOMI CVS: S1-S2 clear, no murmur rubs or gallops Chest: clear to auscultation bilaterally, no wheezing, rales or rhonchi Abdomen: soft nontender, nondistended, normal bowel sounds  Extremities: no cyanosis, clubbing or edema noted bilaterally Neuro: Cranial nerves II-XII intact, no focal neurological deficits, Pain in the right lower extremity upon lifting the right leg   Lab Results: Basic Metabolic Panel:  Recent Labs Lab 05/13/14 1545 05/13/14 1600 05/14/14 0249  NA 135 137 139  K 3.8 3.8 3.7  CL 101 99 108  CO2 27  --  27  GLUCOSE 151* 151* 96  BUN _1 CREATININE 0.80 0.70 0.66  CALCIUM 8.6  --  8.3*   Liver Function Tests:  Recent Labs Lab 05/14/14 0249  AST 13  ALT 8  ALKPHOS 52  BILITOT 0.5  PROT 5.8*  ALBUMIN 2.7*   No results for input(s): LIPASE, AMYLASE in the last 168 hours. No results for input(s): AMMONIA in the last 168 hours. CBC:  Recent Labs Lab 05/13/14 1545 05/13/14 1600 05/14/14 0249  WBC 13.8*  --  9.8  NEUTROABS 11.2*  --   --   HGB 12.6* 14.6 12.1*  HCT 37.8* 43.0 35.7*  MCV 88.7  --  89.0  PLT 247  --  265   Cardiac Enzymes:  Recent Labs Lab 05/13/14 2140 05/14/14 0249 05/14/14 0902  TROPONINI <0.03 <0.03 <0.03   BNP: Invalid input(s): POCBNP CBG:  Recent Labs Lab 05/13/14 2144 05/14/14 0655  GLUCAP 99 92     Micro Results: No results found for this or any previous visit (from the past 240 hour(s)).  Studies/Results: X-ray Chest Pa And  Lateral  05/13/2014   CLINICAL DATA:  Hilar enlargement. Left leg weakness. Patient passed out x2 today.  EXAM: CHEST  2 VIEW  COMPARISON:  05/13/2014  FINDINGS: Normal heart size and pulmonary vascularity. Emphysematous changes in the lungs. Scattered central fibrosis, likely due to chronic bronchitis. Right hilum remains enlarged and irregular. Differential diagnosis includes central arterial enlargement, mass, or lymphadenopathy. CT suggested for further evaluation. No blunting of costophrenic angles. No pneumothorax. Degenerative changes in the spine.  IMPRESSION: Persistent nonspecific enlargement of right hilum. CT recommended for further evaluation. Emphysematous changes and scattered fibrosis in the lungs.   Electronically Signed   By: Lucienne Capers M.D.   On: 05/13/2014 21:38   Ct Head Wo Contrast  05/13/2014   CLINICAL DATA:  Three syncopal episodes  EXAM: CT HEAD  WITHOUT CONTRAST  TECHNIQUE: Contiguous axial images were obtained from the base of the skull through the vertex without intravenous contrast.  COMPARISON:  None.  FINDINGS: No skull fracture is noted. Mild mucosal thickening posterior aspect of the right maxillary sinus. The mastoid air cells are unremarkable. There is mucosal thickening with air-fluid level posterior aspect of the left sphenoid sinus. Mild atherosclerotic calcifications of carotid siphon.  No intracranial hemorrhage, mass effect or midline shift.  No acute cortical infarction. Mild cerebral atrophy. No mass lesion is noted on this unenhanced scan.  IMPRESSION: No acute intracranial abnormality. Mild cerebral atrophy. Paranasal sinuses disease as described above.   Electronically Signed   By: Lahoma Crocker M.D.   On: 05/13/2014 17:32   Mr Brain Wo Contrast  05/14/2014   CLINICAL DATA:  59 year old male with history of smoking presenting with syncopal episodes/ passing out. Subsequent encounter.  EXAM: MRI HEAD WITHOUT CONTRAST  TECHNIQUE: Multiplanar, multiecho pulse  sequences of the brain and surrounding structures were obtained without intravenous contrast.  COMPARISON:  05/13/2014 head CT.  FINDINGS: No acute infarct.  No intracranial hemorrhage.  Minimal small vessel disease type changes.  Incidentally noted are small cysts at the hippocampal level greater on the left.  No hydrocephalus.  No worrisome intracranial mass lesion noted on this unenhanced exam.  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  Small mega cisterna magna incidentally noted.  Cervical medullary junction, pineal region and orbital structures unremarkable. Partially empty non expanded sella incidentally noted.  IMPRESSION: No acute infarct.  No intracranial hemorrhage.  No worrisome intracranial mass identified on this unenhanced exam.  Please see above.   Electronically Signed   By: Chauncey Cruel M.D.   On: 05/14/2014 10:47   Mr Lumbar Spine Wo Contrast  05/14/2014   CLINICAL DATA:  Right leg pain and numbness.  EXAM: MRI LUMBAR SPINE WITHOUT CONTRAST  TECHNIQUE: Multiplanar, multisequence MR imaging of the lumbar spine was performed. No intravenous contrast was administered.  COMPARISON:  None.  FINDINGS: Normal conus tip at L1-2.  Normal paraspinal soft tissues.  T11-12 through L2-3:  Normal.  L3-4: Tiny disc bulges into the neural foramina with no neural impingement.  L3-4: Right small extra foraminal disc bulge adjacent 2 but not compressing the right L3 nerve lateral to the neural foramen. Slight edema in the adjacent endplates. Minimal left extra foraminal disc bulge.  L4-5: Small central disc bulge with annular fissure with no neural impingement. Slight hypertrophy of the facet joints.  L5-S1: Normal disc. Minimal degenerative changes of the facet joints.  IMPRESSION: Minimal degenerative disc disease at L3-4 and L4-5 without neural impingement.   Electronically  Signed   By: Rozetta Nunnery M.D.   On: 05/14/2014 11:14   Dg Chest Portable 1 View  05/13/2014   CLINICAL DATA:  Syncope.  Nausea vomiting and SOB.  EXAM: PORTABLE CHEST - 1 VIEW  COMPARISON:  None.  FINDINGS: Heart size is normal. There is increased fullness within the right hilar region, nonspecific. The lungs are hyperinflated and there are interstitial changes suggesting emphysema. There is no airspace consolidation.  IMPRESSION: 1. Prominent right hilar region is nonspecific. This may reflect pulmonary artery enlargement, adenopathy or right hilar mass. Recommend further evaluation with upright PA and lateral chest radiograph. 2. Emphysema.   Electronically Signed   By: Kerby Moors M.D.   On: 05/13/2014 16:17    Medications: Scheduled Meds: .  aspirin EC  81 mg Oral Daily  . enoxaparin (LOVENOX) injection  40 mg Subcutaneous Q24H  . feeding supplement (ENSURE COMPLETE)  237 mL Oral BID BM  . Influenza vac split quadrivalent PF  0.5 mL Intramuscular Tomorrow-1000  . insulin aspart  0-5 Units Subcutaneous QHS  . insulin aspart  0-9 Units Subcutaneous TID WC  . nicotine  21 mg Transdermal Daily  . pantoprazole  20 mg Oral Daily  . pneumococcal 23 valent vaccine  0.5 mL Intramuscular Tomorrow-1000  . sodium chloride  3 mL Intravenous Q12H      LOS: 1 day   Loy Little M.D. [facility] 05/14/2014, 12:46 PM Pager: [phone]  If 7PM-7AM, please contact night-coverage [email or web address] Password [email or web address]

## 2014-05-14 NOTE — Consult Note (Signed)
[facility] NOTE   MRN : [id number]  Reason for Consult: symptoms of claudication right LE   History of Present Illness: 59 y/o male with right leg claudication symptoms for more than a month.  He usually walks 1 mile each way to work daily and has had to stop and rest about every 1/4 mile for more than a month.  He states he has smoked since he was 59 y/o and he has a family history of hypertension and DM.  He reports no history of blood clots, PE or stroke.  He reports not taking any home prescription medications.   Current Facility-Administered Medications  Medication Dose Route Frequency Provider Last Rate Last Dose  . 0.9 %  sodium chloride infusion   Intravenous Continuous Sid Falcon, MD 75 mL/hr at 05/13/14 2221    . alum & mag hydroxide-simeth (MAALOX/MYLANTA) 200-200-20 MG/5ML suspension 30 mL  30 mL Oral Q6H PRN Sid Falcon, MD      . aspirin EC tablet 81 mg  81 mg Oral Daily Sid Falcon, MD   81 mg at 05/14/14 1106  . enoxaparin (LOVENOX) injection 40 mg  40 mg Subcutaneous Q24H Sid Falcon, MD   40 mg at 05/13/14 2221  . feeding supplement (ENSURE COMPLETE) (ENSURE COMPLETE) liquid 237 mL  237 mL Oral BID BM Sid Falcon, MD   237 mL at 05/14/14 1000  . Influenza vac split quadrivalent PF (FLUARIX) injection 0.5 mL  0.5 mL Intramuscular Tomorrow-1000 Sid Falcon, MD      . insulin aspart (novoLOG) injection 0-5 Units  0-5 Units Subcutaneous QHS Sid Falcon, MD   0 Units at 05/13/14 2200  . insulin aspart (novoLOG) injection 0-9 Units  0-9 Units Subcutaneous TID WC Sid Falcon, MD   Stopped at 05/14/14 1200  . nicotine (NICODERM CQ - dosed in mg/24 hours) patch 21 mg  21 mg Transdermal Daily Rhetta Mura Schorr, NP   21 mg at 05/14/14 1108  . ondansetron (ZOFRAN) tablet 4 mg  4 mg Oral Q6H PRN Sid Falcon, MD       Or  . ondansetron [facility]) injection 4 mg  4 mg Intravenous Q6H PRN Sid Falcon, MD      . oxyCODONE  (Oxy IR/ROXICODONE) immediate release tablet 5 mg  5 mg Oral Q4H PRN Sid Falcon, MD      . pantoprazole (PROTONIX) EC tablet 20 mg  20 mg Oral Daily Sid Falcon, MD   20 mg at 05/14/14 1106  . pneumococcal 23 valent vaccine (PNU-IMMUNE) injection 0.5 mL  0.5 mL Intramuscular Tomorrow-1000 Sid Falcon, MD      . polyethylene glycol (MIRALAX / GLYCOLAX) packet 17 g  17 g Oral Daily PRN Sid Falcon, MD      . pregabalin (LYRICA) capsule 75 mg  75 mg Oral BID Ripudeep K Rai, MD      . sodium chloride 0.9 % injection 3 mL  3 mL Intravenous Q12H Sid Falcon, MD   3 mL at 05/14/14 1108    Pt meds include: Statin :No Betablocker: No ASA: No Other anticoagulants/antiplatelets: none  Past Medical History  Diagnosis Date  . History of palpitations     During [employer] intake exam, not since    History reviewed. No pertinent past surgical history.  Social History History  Substance Use Topics  . Smoking status: Current Every Day Smoker -- 0.50 packs/day  for 47 years    Types: Cigarettes  . Smokeless tobacco: Not on file  . Alcohol Use: No    Family History Family History  Problem Relation Age of Onset  . Diabetes Mellitus I Mother   . Peripheral vascular disease Father   . Leukemia Maternal Grandfather   . Leukemia Maternal Uncle   . Kidney disease Cousin   . Breast cancer Cousin     No Known Allergies   REVIEW OF SYSTEMS  General: [x ] Weight loss, [ ]  Fever, [ ]  chills Neurologic: [ ]  Dizziness, [ ]  Blackouts, [ ]  Seizure [ ]  Stroke, [ ]  "Mini stroke", [ ]  Slurred speech, [ ]  Temporary blindness; [ ]  weakness in arms or legs, [ ]  Hoarseness [ ]  Dysphagia Cardiac: [ ]  Chest pain/pressure, [ ]  Shortness of breath at rest [ ]  Shortness of breath with exertion, [ ]  Atrial fibrillation or irregular heartbeat  Vascular: [x ] Pain in legs with walking, [ ]  Pain in legs at rest, [ ]  Pain in legs at night,  [ ]  Non-healing ulcer, [ ]  Blood clot in vein/DVT,   Pulmonary: [  ] Home oxygen, [ ]  Productive cough, [ ]  Coughing up blood, [ ]  Asthma,  [ ]  Wheezing [ ]  COPD Musculoskeletal:  [ ]  Arthritis, [ ]  Low back pain, [ ]  Joint pain Hematologic: [ ]  Easy Bruising, [ ]  Anemia; [ ]  Hepatitis Gastrointestinal: [ ]  Blood in stool, [ ]  Gastroesophageal Reflux/heartburn, Urinary: [ ]  chronic Kidney disease, [ ]  on HD - [ ]  MWF or [ ]  TTHS, [ ]  Burning with urination, [ ]  Difficulty urinating Skin: [ ]  Rashes, [ ]  Wounds Psychological: [ ]  Anxiety, [ ]  Depression  Physical Examination Filed Vitals:   05/13/14 2030 05/13/14 2107 05/14/14 0514 05/14/14 1105  BP:  123/76 112/71 107/64  Pulse: 69 66 73 70  Temp:  98.3 F (36.8 C) 98.6 F (37 C) 98.2 F (36.8 C)  TempSrc:  Oral Oral Oral  Resp: 14 16 17 18   Height:  5\' 6"  (1.676 m)    Weight:  118 lb 3.2 oz (53.615 kg) 118 lb 6.2 oz (53.7 kg)   SpO2: 100% 99% 97% 98%   Body mass index is 19.12 kg/(m^2).  General:  WDWN in NAD HENT: WNL Eyes: Pupils equal Pulmonary: normal non-labored breathing , without Rales, rhonchi,  wheezing Cardiac: RRR, without  Murmurs, rubs or gallops; No carotid bruits Abdomen: soft, NT, no masses Skin: no rashes, ulcers noted;  no Gangrene , no cellulitis; no open wounds;   Vascular Exam/Pulses:Palpable radial pulses bilaterally, left femoral popliteal and DP palpable.  Weak right femoral pulses and doppler monophasic DP/PT.  No ulcers or skin wounds on bilateral LE.   Musculoskeletal: no muscle wasting or atrophy; no edema  Neurologic: A&O X 3; Appropriate Affect ;  SENSATION: normal; MOTOR FUNCTION: 5/5 Symmetric Speech is fluent/normal   Significant Diagnostic Studies: CBC Lab Results  Component Value Date   WBC 9.8 05/14/2014   HGB 12.1* 05/14/2014   HCT 35.7* 05/14/2014   MCV 89.0 05/14/2014   PLT 265 05/14/2014    BMET    Component Value Date/Time   NA 139 05/14/2014 0249   K 3.7 05/14/2014 0249   CL 108 05/14/2014 0249   CO2 27 05/14/2014 0249    GLUCOSE 96 05/14/2014 0249   BUN 9 05/14/2014 0249   CREATININE 0.66 05/14/2014 0249   CALCIUM 8.3* 05/14/2014 0249   GFRNONAA >90 05/14/2014 0249  GFRAA >90 05/14/2014 0249   Estimated Creatinine Clearance: 74.6 mL/min (by C-G formula based on Cr of 0.66).  COAG Lab Results  Component Value Date   INR 1.16 05/14/2014     Non-Invasive Vascular Imaging:  Pending ABI's ASSESSMENT/PLAN:  PAD right LE decreased femoral and weak doppler signals monophasic on the right LE.  Palpable pulses on the left. We will review the ABI study when available and he will likely need an angiogram with right LE run off to better delineate the area or areas of PAD.      Laurence Slate [facility] 05/14/2014 3:24 PM  I have examined the patient, reviewed and agree with above. Onset of right leg claudication which is worsened over the last 2 months. Admitted with syncopal episode with no obvious calls. Patient relates that was so walking and felt lightheaded and had numbness in his right leg which was typical after walking. Does not have any history of tissue loss. Does have some baseline numbness over the Last couple months in his right leg as well. Completely asymptomatic in his left leg.  On physical exam he does have a very faint right femoral pulse and 2+ left femoral and 2+ left posterior tibial pulse. Does not have pedal pulses on the right.  Noninvasive vascular lab studies reveal ankle arm index of 0.5 on the right and normal on the left. A waveform analysis shows monophasic femoral waveforms.  Impression and plan severe claudication and right leg ischemia with some numbness at baseline. 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 Also reports some pain into his right testicle when he is having the severe claudication symptoms. Have recommended arteriography for further evaluation. Explained this would have no relationship to syncope. We'll proceed with arteriogram and possible angioplasty tomorrow  Everard Interrante,  Christapher Gillian, MD 05/14/2014 5:20 PM

## 2014-05-15 ENCOUNTER — Encounter (HOSPITAL_COMMUNITY)
Admission: EM | Disposition: A | Discharge status: Home / Self Care | Payer: Self-pay | Source: Home / Self Care | Attending: Emergency Medicine

## 2014-05-15 ENCOUNTER — Encounter (HOSPITAL_COMMUNITY): Payer: Self-pay | Admitting: Surgery

## 2014-05-15 ENCOUNTER — Other Ambulatory Visit: Payer: Self-pay | Admitting: *Deleted

## 2014-05-15 DIAGNOSIS — E43 Unspecified severe protein-calorie malnutrition: Secondary | ICD-10-CM | POA: Insufficient documentation

## 2014-05-15 DIAGNOSIS — I739 Peripheral vascular disease, unspecified: Secondary | ICD-10-CM

## 2014-05-15 HISTORY — PX: LOWER EXTREMITY ANGIOGRAM: SHX5508

## 2014-05-15 LAB — BASIC METABOLIC PANEL
Anion gap: 5 (ref 5–15)
BUN: 9 mg/dL (ref 6–23)
CHLORIDE: 105 mmol/L (ref 96–112)
CO2: 25 mmol/L (ref 19–32)
Calcium: 8.4 mg/dL (ref 8.4–10.5)
Creatinine, Ser: 0.74 mg/dL (ref 0.50–1.35)
GFR calc Af Amer: >90 mL/min (ref >90)
GFR calc non Af Amer: >90 mL/min (ref >90)
Glucose, Bld: 106 mg/dL — ABNORMAL HIGH (ref 70–99)
Potassium: 3.8 mmol/L (ref 3.5–5.1)
Sodium: 135 mmol/L (ref 135–145)

## 2014-05-15 LAB — GLUCOSE, CAPILLARY
GLUCOSE-CAPILLARY: 97 mg/dL (ref 70–99)
GLUCOSE-CAPILLARY: 91 mg/dL (ref 70–99)
Glucose-Capillary: 107 mg/dL — ABNORMAL HIGH (ref 70–99)
Glucose-Capillary: 130 mg/dL — ABNORMAL HIGH (ref 70–99)

## 2014-05-15 LAB — CBC
HCT: 33.3 % — ABNORMAL LOW (ref 39.0–52.0)
HEMOGLOBIN: 11.1 g/dL — AB (ref 13.0–17.0)
MCH: 29.7 pg (ref 26.0–34.0)
MCHC: 33.3 g/dL (ref 30.0–36.0)
MCV: 89 fL (ref 78.0–100.0)
Platelets: 225 10*3/uL (ref 150–400)
RBC: 3.74 MIL/uL — AB (ref 4.22–5.81)
RDW: 13.6 % (ref 11.5–15.5)
WBC: 10.3 10*3/uL (ref 4.0–10.5)

## 2014-05-15 LAB — POCT ACTIVATED CLOTTING TIME: Activated Clotting Time: 227 seconds

## 2014-05-15 SURGERY — ANGIOGRAM, LOWER EXTREMITY
Anesthesia: LOCAL | Laterality: Right

## 2014-05-15 MED ORDER — ACETAMINOPHEN 325 MG PO TABS
325.0000 mg | ORAL_TABLET | ORAL | Status: DC | PRN
  Administered 2014-05-15: 650 mg via ORAL
  Filled 2014-05-15: qty 2

## 2014-05-15 MED ORDER — LIDOCAINE HCL (PF) 1 % IJ SOLN
INTRAMUSCULAR | Status: AC
  Filled 2014-05-15: qty 30

## 2014-05-15 MED ORDER — HYDROCOD POLST-CHLORPHEN POLST 10-8 MG/5ML PO LQCR
5.0000 mL | Freq: Once | ORAL | Status: AC
  Administered 2014-05-15: 5 mL via ORAL
  Filled 2014-05-15: qty 5

## 2014-05-15 MED ORDER — MIDAZOLAM HCL 2 MG/2ML IJ SOLN
INTRAMUSCULAR | Status: AC
  Filled 2014-05-15: qty 2

## 2014-05-15 MED ORDER — FENTANYL CITRATE 0.05 MG/ML IJ SOLN
INTRAMUSCULAR | Status: AC
  Filled 2014-05-15: qty 2

## 2014-05-15 MED ORDER — METOPROLOL TARTRATE 1 MG/ML IV SOLN: 2.0000 mg | INTRAVENOUS | Status: DC | PRN

## 2014-05-15 MED ORDER — HYDRALAZINE HCL 20 MG/ML IJ SOLN: 5.0000 mg | INTRAMUSCULAR | Status: DC | PRN

## 2014-05-15 MED ORDER — NITROGLYCERIN 0.2 MG/ML ON CALL CATH LAB
INTRAVENOUS | Status: AC
  Filled 2014-05-15: qty 2

## 2014-05-15 MED ORDER — ACETAMINOPHEN 650 MG RE SUPP: 325.0000 mg | RECTAL | Status: DC | PRN

## 2014-05-15 MED ORDER — MORPHINE SULFATE 2 MG/ML IJ SOLN: 2.0000 mg | INTRAMUSCULAR | Status: DC | PRN

## 2014-05-15 MED ORDER — PHENOL 1.4 % MT LIQD
1.0000 | OROMUCOSAL | Status: DC | PRN
  Filled 2014-05-15: qty 177

## 2014-05-15 MED ORDER — HEPARIN (PORCINE) IN NACL 2-0.9 UNIT/ML-% IJ SOLN
INTRAMUSCULAR | Status: AC
  Filled 2014-05-15: qty 1000

## 2014-05-15 MED ORDER — SODIUM CHLORIDE 0.9 % IV SOLN: INTRAVENOUS | Status: DC

## 2014-05-15 MED ORDER — GUAIFENESIN-DM 100-10 MG/5ML PO SYRP
15.0000 mL | ORAL_SOLUTION | ORAL | Status: DC | PRN
  Administered 2014-05-15 (×2): 15 mL via ORAL
  Filled 2014-05-15 (×2): qty 15

## 2014-05-15 MED ORDER — LABETALOL HCL 5 MG/ML IV SOLN
10.0000 mg | INTRAVENOUS | Status: DC | PRN
  Filled 2014-05-15: qty 4

## 2014-05-15 NOTE — Progress Notes (Signed)
Patient ID: Dustin Freeman  male  ZOX:[id number]    DOB: 01/28/1956    DOA: 05/13/2014  PCP: No PCP Per Patient  Brief history of present illness  Dustin Freeman is a 59yo man with PMH of tobacco use (no recent PCP visit) who presents for passing out/syncope. Dustin Freeman reports that he was walking to work today (usually walks 1 mile to work at [employer] to and back daily) when his leg started to feel numb on the right and he sat down to rest. Upon sitting, he noticed his vision went white and the next thing he knew he was face down on the ice. Per his wife, he hit his head on some ice (she was not present, this was reported by the patient). She helped him back to sitting when he passed out again. This happened twice and EMS was called. The patient remembers the intervening times, but was groggy. There was no report of jerking movements or bowel or bladder loss. Dustin Freeman has been having a myriad of symptoms for the last few months, the most concerning being pain with walking in the right leg as well as occasional numbness of the right leg. He reports occasional back pain, but not associated with leg pain. He presented to the ED on 1/20 of this year with almost the exact same presentation. He is a smoker since he was 11, about 1/2 pack per day. His father had severe circulation issues in his legs requiring amputation of both legs. During his last surgery, he had a cardiac arrest and passed away. He does not have any FM of cardiomyopathy, early death from heart disease or palpitations that he knows of. He has been told his heart occasionally "skips a beat" however, this was years ago on his [employer] entrance exam. He has not seen a physician in over 10 years. He specifically denied chest pain.  Dustin Freeman has other symptoms including a 2 month history of SOB with activity and at rest which results in coughing fits and emesis of recent eaten food. He has associated sharp abdominal pain with the  coughing and nausea. This happens about twice a week. He denies any hemetemesis or hemoptysis, however, he does cough up "congestion" regularly. He has had no fever or chills, no sick contacts. He further reports decreased PO intake in the same time period, but does not weigh himself so is not sure if he has lost weight. No night sweats reported. He further notes over the same time period 3-4 times per week having very dark stools. Further notes that the pain in his leg radiates to his scrotum on occasion, but starts in his calf.   Assessment/Plan: Principal Problem:   Syncope: Unclear etiology, patient has not followed with PCP in 10 years possibly vasovagal versus "right leg gave away due to claudication " - Ruled out for acute ACS, EKG reviewed, rate 71, no acute ST-T wave changes suggestive of ischemia, 2-D echo showed normal LVEF - Continue aspirin - LDL 73 - MRI of the brain negative for acute stroke  Active Problems: Right lower extremities numbness and tingling/ severe claudication, PVD - ABI's done Rt 0.5, lEFT 1.06 - venous duplex showed no DVT - arterial doppler showed 50% stenosis in common femoral artery - MRI of the lumbar spine showed mild degenerative disc disease - Vascular surgery consulted, underwent aortogram and recanalization and 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 - Continue aspirin,  placed on Lyrica    Leukocytosis: Likely stress de-margination, resolved     Hyperglycemia/ diabetes mellitus - Hemoglobin A1c 6.0, will need low dose oral hypoglycemic/metformin at discharge    Anemia: Likely anemia of chronic disease, patient denies any melanotic stools during my encounter - Iron studies within normal limits, stool occult test pending   DVT Prophylaxis: lovenox  Code Status: FC  Family Communication:  Disposition: hopefully DC tomorrow if ok with vascular sx  Consultants:  none    Procedures:  MRI of the brain, MRI of the lumbar spine   Antibiotics:  None    Subjective: Patient seen and examined after procedure, doing well, pain in leg controlled.   Objective: Weight change: 0.685 kg (1 lb 8.2 oz)  Intake/Output Summary (Last 24 hours) at 05/15/14 1457 Last data filed at 05/15/14 1353  Gross per 24 hour  Intake   2888 ml  Output   2300 ml  Net    588 ml   Blood pressure 135/78, pulse 83, temperature 98.8 F (37.1 C), temperature source Oral, resp. rate 16, height 5' 6"  (1.676 m), weight 54.3 kg (119 lb 11.4 oz), SpO2 98 %.  Physical Exam: General: Alert and awake, oriented x3, not in any acute distress. CVS: S1-S2 clear, no murmur rubs or gallops Chest: clear to auscultation bilaterally, no wheezing, rales or rhonchi Abdomen: soft nontender, nondistended, normal bowel sounds  Extremities: no cyanosis, clubbing or edema noted bilaterally    Lab Results: Basic Metabolic Panel:  Recent Labs Lab 05/14/14 0249 05/15/14 0443  NA 139 135  K 3.7 3.8  CL 108 105  CO2 27 25  GLUCOSE 96 106*  BUN 9 9  CREATININE 0.66 0.74  CALCIUM 8.3* 8.4   Liver Function Tests:  Recent Labs Lab 05/14/14 0249  AST 13  ALT 8  ALKPHOS 52  BILITOT 0.5  PROT 5.8*  ALBUMIN 2.7*   No results for input(s): LIPASE, AMYLASE in the last 168 hours. No results for input(s): AMMONIA in the last 168 hours. CBC:  Recent Labs Lab 05/13/14 1545  05/14/14 0249 05/15/14 0443  WBC 13.8*  --  9.8 10.3  NEUTROABS 11.2*  --   --   --   HGB 12.6*  < > 12.1* 11.1*  HCT 37.8*  < > 35.7* 33.3*  MCV 88.7  --  89.0 89.0  PLT 247  --  265 225  < > = values in this interval not displayed. Cardiac Enzymes:  Recent Labs Lab 05/13/14 2140 05/14/14 0249 05/14/14 0902  TROPONINI <0.03 <0.03 <0.03   BNP: Invalid input(s): POCBNP CBG:  Recent Labs Lab 05/14/14 0655 05/14/14 1542 05/14/14 2143 05/15/14 0603 05/15/14 1130  GLUCAP 92 127* 108* 97 107*      Micro Results: No results found for this or any previous visit (from the past 240 hour(s)).  Studies/Results: X-ray Chest Pa And Lateral  05/13/2014   CLINICAL DATA:  Hilar enlargement. Left leg weakness. Patient passed out x2 today.  EXAM: CHEST  2 VIEW  COMPARISON:  05/13/2014  FINDINGS: Normal heart size and pulmonary vascularity. Emphysematous changes in the lungs. Scattered central fibrosis, likely due to chronic bronchitis. Right hilum remains enlarged and irregular. Differential diagnosis includes central arterial enlargement, mass, or lymphadenopathy. CT suggested for further evaluation. No blunting of costophrenic angles. No pneumothorax. Degenerative changes in the spine.  IMPRESSION: Persistent nonspecific enlargement of right hilum. CT recommended for further evaluation. Emphysematous changes and scattered fibrosis in the lungs.   Electronically  Signed   By: Lucienne Capers M.D.   On: 05/13/2014 21:38   Ct Head Wo Contrast  05/13/2014   CLINICAL DATA:  Three syncopal episodes  EXAM: CT HEAD WITHOUT CONTRAST  TECHNIQUE: Contiguous axial images were obtained from the base of the skull through the vertex without intravenous contrast.  COMPARISON:  None.  FINDINGS: No skull fracture is noted. Mild mucosal thickening posterior aspect of the right maxillary sinus. The mastoid air cells are unremarkable. There is mucosal thickening with air-fluid level posterior aspect of the left sphenoid sinus. Mild atherosclerotic calcifications of carotid siphon.  No intracranial hemorrhage, mass effect or midline shift.  No acute cortical infarction. Mild cerebral atrophy. No mass lesion is noted on this unenhanced scan.  IMPRESSION: No acute intracranial abnormality. Mild cerebral atrophy. Paranasal sinuses disease as described above.   Electronically Signed   By: Lahoma Crocker M.D.   On: 05/13/2014 17:32   Mr Brain Wo Contrast  05/14/2014   CLINICAL DATA:  59 year old male with history of smoking  presenting with syncopal episodes/ passing out. Subsequent encounter.  EXAM: MRI HEAD WITHOUT CONTRAST  TECHNIQUE: Multiplanar, multiecho pulse sequences of the brain and surrounding structures were obtained without intravenous contrast.  COMPARISON:  05/13/2014 head CT.  FINDINGS: No acute infarct.  No intracranial hemorrhage.  Minimal small vessel disease type changes.  Incidentally noted are small cysts at the hippocampal level greater on the left.  No hydrocephalus.  No worrisome intracranial mass lesion noted on this unenhanced exam.  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  Small mega cisterna magna incidentally noted.  Cervical medullary junction, pineal region and orbital structures unremarkable. Partially empty non expanded sella incidentally noted.  IMPRESSION: No acute infarct.  No intracranial hemorrhage.  No worrisome intracranial mass identified on this unenhanced exam.  Please see above.   Electronically Signed   By: Chauncey Cruel M.D.   On: 05/14/2014 10:47   Mr Lumbar Spine Wo Contrast  05/14/2014   CLINICAL DATA:  Right leg pain and numbness.  EXAM: MRI LUMBAR SPINE WITHOUT CONTRAST  TECHNIQUE: Multiplanar, multisequence MR imaging of the lumbar spine was performed. No intravenous contrast was administered.  COMPARISON:  None.  FINDINGS: Normal conus tip at L1-2.  Normal paraspinal soft tissues.  T11-12 through L2-3:  Normal.  L3-4: Tiny disc bulges into the neural foramina with no neural impingement.  L3-4: Right small extra foraminal disc bulge adjacent 2 but not compressing the right L3 nerve lateral to the neural foramen. Slight edema in the adjacent endplates. Minimal left extra foraminal disc bulge.  L4-5: Small central disc bulge with annular fissure with no neural impingement. Slight hypertrophy of the facet joints.  L5-S1: Normal disc. Minimal  degenerative changes of the facet joints.  IMPRESSION: Minimal degenerative disc disease at L3-4 and L4-5 without neural impingement.   Electronically Signed   By: Rozetta Nunnery M.D.   On: 05/14/2014 11:14   Dg Chest Portable 1 View  05/13/2014   CLINICAL DATA:  Syncope.  Nausea vomiting and SOB.  EXAM: PORTABLE CHEST - 1 VIEW  COMPARISON:  None.  FINDINGS: Heart size is normal. There is increased fullness within the right hilar region, nonspecific. The lungs are hyperinflated and there are interstitial changes suggesting emphysema. There is no airspace consolidation.  IMPRESSION: 1. Prominent right hilar region is nonspecific. This may reflect pulmonary artery enlargement, adenopathy or right hilar mass. Recommend  further evaluation with upright PA and lateral chest radiograph. 2. Emphysema.   Electronically Signed   By: Kerby Moors M.D.   On: 05/13/2014 16:17    Medications: Scheduled Meds: . aspirin EC  81 mg Oral Daily  . enoxaparin (LOVENOX) injection  40 mg Subcutaneous Q24H  . feeding supplement (ENSURE COMPLETE)  237 mL Oral BID BM  . insulin aspart  0-5 Units Subcutaneous QHS  . insulin aspart  0-9 Units Subcutaneous TID WC  . nicotine  21 mg Transdermal Daily  . pantoprazole  20 mg Oral Daily  . pregabalin  75 mg Oral BID  . sodium chloride  3 mL Intravenous Q12H      LOS: 2 days   Tessie Ordaz M.D. [facility] 05/15/2014, 2:57 PM Pager: [phone]  If 7PM-7AM, please contact night-coverage [email or web address] Password [email or web address]

## 2014-05-15 NOTE — Progress Notes (Signed)
UR completed 

## 2014-05-15 NOTE — Progress Notes (Deleted)
Site area: Left groin a 7 french sheath was removed  Site Prior to Removal:  Level 0  Pressure Applied For 20 MINUTES    Minutes Beginning at1030am  Manual:   Yes.    Patient Status During Pull:  stable  Post Pull Groin Site:  Level 0  Post Pull Instructions Given:  Yes.    Post Pull Pulses Present:  Yes.    Dressing Applied:  Yes.    Comments:  VS remain stable during sheath pull.  Pt denies any discomfort at site.

## 2014-05-15 NOTE — Progress Notes (Signed)
Site area: left groin a 7 french sheath was removed  Site Prior to Removal:  Level 0  Pressure Applied For 15 MINUTES    Minutes Beginning at 1030am  Manual:   Yes.    Patient Status During Pull:  stable  Post Pull Groin Site:  Level 0  Post Pull Instructions Given:  Yes.    Post Pull Pulses Present:  Yes.    Dressing Applied:  Yes.    Comments:  VS remain stable during sheath pull.  Pt denies any discomfort at this time.

## 2014-05-15 NOTE — Progress Notes (Signed)
PT Cancellation Note  Patient Details Name: Dustin Freeman MRN: [id number] DOB: Sep 26, 1955   Cancelled Treatment:    Reason Eval/Treat Not Completed: Medical issues which prohibited therapy. Pt on bedrest til 2:50 PM.  Will check back as schedule permits.   [name] 05/15/2014, 11:40 AM

## 2014-05-15 NOTE — Care Management Note (Signed)
    Page 1 of 1   05/17/2014     4:38:31 PM CARE MANAGEMENT NOTE 05/17/2014  Patient:  Dustin Freeman, Dustin Freeman   Account Number:  [id number]  Date Initiated:  05/15/2014  Documentation initiated by:  Ganon Demasi  Subjective/Objective Assessment:   Pt adm on 05/13/14 with PVD/CFA blockage.  PTA, pt independent, lives with spouse.     Action/Plan:   Pt has no insurance and no PCP.  He is agreeable to follow up at [facility] and [facility]. Would recommend PT consult when able to tolerate.   Anticipated DC Date:  05/17/2014   Anticipated DC Plan:  HOME/SELF CARE  In-house referral  Clinical Social Worker      DC Planning Services  CM consult  [place] [facility]  Follow-up appt scheduled      Choice offered to / List presented to:     DME arranged  [facility]      DME agency  [facility].        Status of service:  Completed, signed off Medicare Important Message given?   (If response is "NO", the following Medicare IM given date fields will be blank) Date Medicare IM given:   Medicare IM given by:   Date Additional Medicare IM given:   Additional Medicare IM given by:    Discharge Disposition:    Per UR Regulation:  Reviewed for med. necessity/level of care/duration of stay  If discussed at [place] of Stay Meetings, dates discussed:    Comments:  05/16/14 Ellan Lambert, RN, BSN [phone] pt for dc home today.  Follow up appt made at [facility] and [facility] for 05/24/14 at 3:30. Appt info put on AVS.  Pt states unable to afford meds; he is eligible for med assist through [place] letter given with explanation of program benefits. PT recommending HHPT, but pt ineligible, as he has no insurance.  CSW consulted for bus pass, as pt has no ride home.

## 2014-05-15 NOTE — Op Note (Signed)
Patient name: Dustin Freeman MRN: [id number] DOB: [birth year] Sex: male  05/13/2014 - 05/15/2014 Pre-operative Diagnosis: Right leg claudication Post-operative diagnosis:  Same Surgeon:  Eldridge Abrahams Procedure Performed:  1.  Ultrasound-guided access, left femoral artery  2.  Ultrasound-guided access, right femoral artery  3.  Abdominal aortogram  4.  Bilateral lower extremity runoff  5.  Stent, right external iliac artery    Indications:  The patient reports approximately a 2 month history of claudication.  Ultrasound identified possible inflow disease.  He is here for further evaluation and possible treatment.  Procedure:  The patient was identified in the holding area and taken to room 8.  The patient was then placed supine on the table and prepped and draped in the usual sterile fashion.  A time out was called.  Ultrasound was used to evaluate the left common femoral artery.  It was patent .  A digital ultrasound image was acquired.  A micropuncture needle was used to access the left common femoral artery under ultrasound guidance.  An 018 wire was advanced without resistance and a micropuncture sheath was placed.  The 018 wire was removed and a benson wire was placed.  The micropuncture sheath was exchanged for a 5 french sheath.  An omniflush catheter was advanced over the wire to the level of L-1.  An abdominal angiogram was obtained.  Next the catheter was pulled down to the aortic bifurcation and pelvic angiography followed by bilateral lower extremity runoff was obtained.  Findings:   Aortogram:  No significant renal artery stenosis.  The infrarenal aorta is irregular without significant stenosis.  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  Diffuse irregularity is noted in the left common and external iliac artery without hemodynamically significant stenosis.  The left hypogastric artery is widely patent.  The right common iliac artery is small but patent without significant  stenosis.  There is an ostial stenosis within the right hypogastric artery.  The right external iliac artery is occluded and reconstitutes distally.  Right Lower Extremity:  The right common femoral artery is patent throughout it's course.  The superficial femoral and profunda femoral artery are widely patent.  The popliteal artery is widely patent.  There appears to be three-vessel runoff, however for visualization is limited by proximal disease.  Left Lower Extremity:  The left common femoral artery profunda femoral and superficial femoral artery are widely patent.  The popliteal artery is widely patent.  Tibial vessel disease is present but all 3 vessels appeared to be patent down to the ankle.  Intervention:  After the above images were acquired, the decision was made to proceed with intervention.  The right common femoral artery was evaluated with ultrasound and found to be patent.  One percent lidocaine was used for local anesthesia.  The right common femoral artery was cannulated under ultrasound guidance with a micropuncture needle.  A 018 wire was advanced followed by the micropuncture sheath.  Then, over a Benson wire a 5 French sheath was placed.  The patient was fully heparinized.  Using a 035 Glidewire and a quick cross catheter, so intimal recanalization was performed.  The wire and catheter tracked easily into the aorta.  A contrast injection was performed, confirming successful recanalization.  Next, a 4 x 80 balloon was used to predilate the occluded artery.  This was followed by primary stenting using a Abbott 7 x 80 self-expanding stent.  This was molded to confirmation using a 6 mm balloon.  Completion  imaging revealed resolution of the stenosis and occlusion in the right external iliac artery.  I elected to close the right groin access.  This was done successfully with a pro-glide.  The patient will be taken to the holding area for removal of the left femoral sheath once his coagulation  profile corrects  Impression:  #1  successful recanalization of an occluded right external iliac artery using a 7 x 80 self-expanding stent    V. Annamarie Major, M.D. [facility] Office: [phone] Pager:  [phone]

## 2014-05-16 ENCOUNTER — Other Ambulatory Visit: Payer: Self-pay | Admitting: *Deleted

## 2014-05-16 ENCOUNTER — Telehealth: Payer: Self-pay | Admitting: Vascular Surgery

## 2014-05-16 DIAGNOSIS — Z9862 Peripheral vascular angioplasty status: Secondary | ICD-10-CM

## 2014-05-16 DIAGNOSIS — I739 Peripheral vascular disease, unspecified: Secondary | ICD-10-CM

## 2014-05-16 LAB — GLUCOSE, CAPILLARY
Glucose-Capillary: 114 mg/dL — ABNORMAL HIGH (ref 70–99)
Glucose-Capillary: 106 mg/dL — ABNORMAL HIGH (ref 70–99)

## 2014-05-16 LAB — POCT ACTIVATED CLOTTING TIME: Activated Clotting Time: 177 seconds

## 2014-05-16 MED ORDER — METFORMIN HCL 500 MG PO TABS: 500.0000 mg | ORAL_TABLET | Freq: Every day | ORAL | Status: DC

## 2014-05-16 MED ORDER — BLOOD GLUCOSE MONITOR KIT: PACK | Status: DC

## 2014-05-16 MED ORDER — PREGABALIN 75 MG PO CAPS: 75.0000 mg | ORAL_CAPSULE | Freq: Two times a day (BID) | ORAL | Status: DC

## 2014-05-16 MED ORDER — CLOPIDOGREL BISULFATE 75 MG PO TABS
75.0000 mg | ORAL_TABLET | Freq: Every day | ORAL | Status: DC
  Administered 2014-05-16: 75 mg via ORAL
  Filled 2014-05-16: qty 1

## 2014-05-16 MED ORDER — NICOTINE 21 MG/24HR TD PT24: 21.0000 mg | MEDICATED_PATCH | Freq: Every day | TRANSDERMAL | Status: DC

## 2014-05-16 MED ORDER — CLOPIDOGREL BISULFATE 75 MG PO TABS: 75.0000 mg | ORAL_TABLET | Freq: Every day | ORAL | Status: DC

## 2014-05-16 MED ORDER — OXYCODONE HCL 5 MG PO TABS
5.0000 mg | ORAL_TABLET | Freq: Four times a day (QID) | ORAL | Status: DC | PRN
Start: 2014-05-16 — End: 2014-07-10

## 2014-05-16 NOTE — Progress Notes (Signed)
Patient ID: Dustin Freeman, male   DOB: 01/11/1956, 59 y.o.   MRN: [id number] Comfortable this morning. Reports that his foot feels better.  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 Discussed this with the patient. Explained potential failure over time and also surgical options versus re-intervention with endovascular techniques. The patient will need to continue Plavix daily. I will see him in the office in one month for follow-up. Stable for discharge from vascular standpoint.

## 2014-05-16 NOTE — Discharge Summary (Signed)
Physician Discharge Summary  Patient ID: Dustin Freeman MRN: [id number] DOB/AGE: April 29, 1955 59 y.o.  Admit date: 05/13/2014 Discharge date: 05/16/2014  Primary Care Physician:  No PCP Per Patient  Discharge Diagnoses:    . Syncope . 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 newly diagnosed diabetes mellitus, type II  . Anemia . leukocytosis Nicotine abuse  Consults: Vascular surgery, Dr. Donnetta Hutching   Recommendations for Outpatient Follow-up:  Patient was started on Plavix daily, metformin 500 mg with breakfast Prescription was provided for glucometer. Patient was strongly counseled on smoking cessation  TESTS THAT NEED FOLLOW-UP  Hemoglobin A1c   DIET: Carb modified diet    Allergies:  No Known Allergies   Discharge Medications:   Medication List    TAKE these medications        blood glucose meter kit and supplies Kit  - Diagnosis: diabetes mellitus.  - Dispense based on patient and insurance preference. Use up to four times daily as directed. (FOR ICD-9 250.00, 250.01).     clopidogrel 75 MG tablet  Commonly known as:  PLAVIX  Take 1 tablet (75 mg total) by mouth daily.     metFORMIN 500 MG tablet  Commonly known as:  GLUCOPHAGE  Take 1 tablet (500 mg total) by mouth daily with breakfast.     nicotine 21 mg/24hr patch  Commonly known as:  NICODERM CQ - dosed in mg/24 hours  Place 1 patch (21 mg total) onto the skin daily.     oxyCODONE 5 MG immediate release tablet  Commonly known as:  Oxy IR/ROXICODONE  Take 1 tablet (5 mg total) by mouth every 6 (six) hours as needed for moderate pain.     pregabalin 75 MG capsule  Commonly known as:  LYRICA  Take 1 capsule (75 mg total) by mouth 2 (two) times daily.         Brief H and P: For complete details please refer to admission H and P, but in brief Dustin Freeman is a 59yo man with PMH of tobacco use (no recent PCP  visit) who presents for passing out/syncope. Dustin Freeman reported that he was walking to work on the day of admission (usually walks 1 mile to work at [employer] to and back daily) when his leg started to feel numb on the right and he sat down to rest. Upon sitting, he noticed his vision went white and the next thing he knew he was face down on the ice. Per his wife, he hit his head on some ice (she was not present, this was reported by the patient). She helped him back to sitting when he passed out again. This happened twice and EMS was called. The patient remembered the intervening times, but was groggy. There was no report of jerking movements or bowel or bladder loss. Dustin Freeman has been having a myriad of symptoms for the last few months, the most concerning being pain with walking in the right leg as well as occasional numbness of the right leg. He reports occasional back pain, but not associated with leg pain. He presented to the ED on 1/20 of this year with almost the exact same presentation. He is a smoker since he was 11, about 1/2 pack per day.  He reported that his father had severe circulation issues in his legs requiring amputation of both legs. During his last surgery, he had a cardiac arrest and passed away.  He does not have any FM of cardiomyopathy, early death from heart disease or palpitations that he knows of. He has been told his heart occasionally "skips a beat" however, this was years ago on his [employer] entrance exam. He has not seen a physician in over 10 years. He specifically denied chest pain.  Dustin Freeman had other symptoms including a 2 month history of SOB with activity and at rest which results in coughing fits and emesis of recent eaten food. He has associated sharp abdominal pain with the coughing and nausea. This happens about twice a week. He denies any hemetemesis or hemoptysis, however, he does cough up "congestion" regularly. He has had no fever or chills, no  sick contacts. He further reported decreased PO intake in the same time period, but does not weigh himself so is not sure if he has lost weight. No night sweats reported. He further notes over the same time period 3-4 times per week having very dark stools. Further notes that the pain in his leg radiates to his scrotum on occasion, but starts in his calf.  Hospital Course:   Syncope: Likely vasovagal versus  "right leg gave away due to claudication " Patient was ruled out for acute ACS. EKG reviewed, rate 71, no acute ST-T wave changes suggestive of ischemia 2-D echo showed normal LVEF. MRI of the brain was negative for acute CVA. Patient was continued on aspirin, lipid panel showed LDL of 73.   Right lower extremities numbness and tingling/ severe claudication, PVD - ABI's done Rt 0.5, lEFT 1.06 - venous duplex showed no DVT - arterial doppler showed 50% stenosis in common femoral artery - MRI of the lumbar spine showed mild degenerative disc disease - Vascular surgery was consulted, patient was seen by Dr. Donnetta Hutching. Patient underwent aortogram and recanalization and 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 - Continue Plavix placed on Lyrica   Leukocytosis: Likely stress de-margination, resolved   Hyperglycemia/ diabetes mellitus - Hemoglobin A1c 6.0, patient reports that he has been a borderline diabetic but not on any medications prior to admission. He was started on metformin 500 mg daily with breakfast. He was given prescription for glucometer, lancets and strips. Patient will need PCP, hence [facility]   Anemia: Likely anemia of chronic disease, patient denies any melanotic stools during my encounter - Iron studies within normal limits  Severe protein calorie malnutrition; in context of chronic illness, nutrition consult was obtained. The patient agreed receiving nutritional supplements  and snacks and was recommended to him for protein rich foods.  Day of Discharge BP 101/73 mmHg  Pulse 83  Temp(Src) 98.8 F (37.1 C) (Oral)  Resp 20  Ht $R'5\' 6"'QU$  (1.676 m)  Wt 53.016 kg (116 lb 14.1 oz)  BMI 18.87 kg/m2  SpO2 93%  Physical Exam: General: Alert and awake oriented x3 not in any acute distress. CVS: S1-S2 clear no murmur rubs or gallops Chest: clear to auscultation bilaterally, no wheezing rales or rhonchi Abdomen: soft nontender, nondistended, normal bowel sounds Extremities: no cyanosis, clubbing or edema noted bilaterally Neuro: Cranial nerves II-XII intact, no focal neurological deficits   The results of significant diagnostics from this hospitalization (including imaging, microbiology, ancillary and laboratory) are listed below for reference.    LAB RESULTS: Basic Metabolic Panel:  Recent Labs Lab 05/14/14 0249 05/15/14 0443  NA 139 135  K 3.7 3.8  CL 108 105  CO2 27 25  GLUCOSE 96 106*  BUN 9 9  CREATININE 0.66 0.74  CALCIUM 8.3* 8.4   Liver Function Tests:  Recent Labs Lab 05/14/14 0249  AST 13  ALT 8  ALKPHOS 52  BILITOT 0.5  PROT 5.8*  ALBUMIN 2.7*   No results for input(s): LIPASE, AMYLASE in the last 168 hours. No results for input(s): AMMONIA in the last 168 hours. CBC:  Recent Labs Lab 05/13/14 1545  05/14/14 0249 05/15/14 0443  WBC 13.8*  --  9.8 10.3  NEUTROABS 11.2*  --   --   --   HGB 12.6*  < > 12.1* 11.1*  HCT 37.8*  < > 35.7* 33.3*  MCV 88.7  --  89.0 89.0  PLT 247  --  265 225  < > = values in this interval not displayed. Cardiac Enzymes:  Recent Labs Lab 05/14/14 0249 05/14/14 0902  TROPONINI <0.03 <0.03   BNP: Invalid input(s): POCBNP CBG:  Recent Labs Lab 05/16/14 0555 05/16/14 1213  GLUCAP 114* 106*    Significant Diagnostic Studies:  X-ray Chest Pa And Lateral  05/13/2014   CLINICAL DATA:  Hilar enlargement. Left leg weakness. Patient passed out x2 today.  EXAM: CHEST  2 VIEW  COMPARISON:   05/13/2014  FINDINGS: Normal heart size and pulmonary vascularity. Emphysematous changes in the lungs. Scattered central fibrosis, likely due to chronic bronchitis. Right hilum remains enlarged and irregular. Differential diagnosis includes central arterial enlargement, mass, or lymphadenopathy. CT suggested for further evaluation. No blunting of costophrenic angles. No pneumothorax. Degenerative changes in the spine.  IMPRESSION: Persistent nonspecific enlargement of right hilum. CT recommended for further evaluation. Emphysematous changes and scattered fibrosis in the lungs.   Electronically Signed   By: Lucienne Capers M.D.   On: 05/13/2014 21:38   Ct Head Wo Contrast  05/13/2014   CLINICAL DATA:  Three syncopal episodes  EXAM: CT HEAD WITHOUT CONTRAST  TECHNIQUE: Contiguous axial images were obtained from the base of the skull through the vertex without intravenous contrast.  COMPARISON:  None.  FINDINGS: No skull fracture is noted. Mild mucosal thickening posterior aspect of the right maxillary sinus. The mastoid air cells are unremarkable. There is mucosal thickening with air-fluid level posterior aspect of the left sphenoid sinus. Mild atherosclerotic calcifications of carotid siphon.  No intracranial hemorrhage, mass effect or midline shift.  No acute cortical infarction. Mild cerebral atrophy. No mass lesion is noted on this unenhanced scan.  IMPRESSION: No acute intracranial abnormality. Mild cerebral atrophy. Paranasal sinuses disease as described above.   Electronically Signed   By: Lahoma Crocker M.D.   On: 05/13/2014 17:32   Mr Brain Wo Contrast  05/14/2014   CLINICAL DATA:  59 year old male with history of smoking presenting with syncopal episodes/ passing out. Subsequent encounter.  EXAM: MRI HEAD WITHOUT CONTRAST  TECHNIQUE: Multiplanar, multiecho pulse sequences of the brain and surrounding structures were obtained without intravenous contrast.  COMPARISON:  05/13/2014 head CT.  FINDINGS: No  acute infarct.  No intracranial hemorrhage.  Minimal small vessel disease type changes.  Incidentally noted are small cysts at the hippocampal level greater on the left.  No hydrocephalus.  No worrisome intracranial mass lesion noted on this unenhanced exam.  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  Small mega cisterna magna incidentally noted.  Cervical medullary junction, pineal region and orbital structures unremarkable. Partially empty non expanded sella incidentally noted.  IMPRESSION: No acute  infarct.  No intracranial hemorrhage.  No worrisome intracranial mass identified on this unenhanced exam.  Please see above.   Electronically Signed   By: Chauncey Cruel M.D.   On: 05/14/2014 10:47   Mr Lumbar Spine Wo Contrast  05/14/2014   CLINICAL DATA:  Right leg pain and numbness.  EXAM: MRI LUMBAR SPINE WITHOUT CONTRAST  TECHNIQUE: Multiplanar, multisequence MR imaging of the lumbar spine was performed. No intravenous contrast was administered.  COMPARISON:  None.  FINDINGS: Normal conus tip at L1-2.  Normal paraspinal soft tissues.  T11-12 through L2-3:  Normal.  L3-4: Tiny disc bulges into the neural foramina with no neural impingement.  L3-4: Right small extra foraminal disc bulge adjacent 2 but not compressing the right L3 nerve lateral to the neural foramen. Slight edema in the adjacent endplates. Minimal left extra foraminal disc bulge.  L4-5: Small central disc bulge with annular fissure with no neural impingement. Slight hypertrophy of the facet joints.  L5-S1: Normal disc. Minimal degenerative changes of the facet joints.  IMPRESSION: Minimal degenerative disc disease at L3-4 and L4-5 without neural impingement.   Electronically Signed   By: Rozetta Nunnery M.D.   On: 05/14/2014 11:14   Dg Chest Portable 1 View  05/13/2014   CLINICAL DATA:  Syncope.  Nausea vomiting and  SOB.  EXAM: PORTABLE CHEST - 1 VIEW  COMPARISON:  None.  FINDINGS: Heart size is normal. There is increased fullness within the right hilar region, nonspecific. The lungs are hyperinflated and there are interstitial changes suggesting emphysema. There is no airspace consolidation.  IMPRESSION: 1. Prominent right hilar region is nonspecific. This may reflect pulmonary artery enlargement, adenopathy or right hilar mass. Recommend further evaluation with upright PA and lateral chest radiograph. 2. Emphysema.   Electronically Signed   By: Kerby Moors M.D.   On: 05/13/2014 16:17    2D ECHO: Study Conclusions  - Left ventricle: The images are severely limited. Endocardium can be seen only in the subcostal views. The impression is given that overall LV function is probably normal. - Right ventricle: RV is seen only in subcostal view. There is RV motion seen. The RV does not seem dilated in this view.  Disposition and Follow-up: Discharge Instructions    Diet Carb Modified    Complete by:  As directed      Discharge instructions    Complete by:  As directed   Please NOTE that you have been started on metformin $RemoveBefo'500mg'VwssjDEkwKG$  daily for diabetes and Plavix daily for the vascular/circulation issues. Please stop smoking.     Discharge instructions    Complete by:  As directed   Please check blood sugars daily before breakfast.     Increase activity slowly    Complete by:  As directed             DISPOSITION: home, home health PT was arranged   DISCHARGE FOLLOW-UP Follow-up Information    Follow up with EARLY, TODD, MD In 4 weeks.   Specialty:  Vascular Surgery   Why:  Left a message with the office to call the patient to schedule an appointment.   Contact information:   [address] [place] [address] [phone]       Follow up with [facility]     On 05/24/2014.   Why:  3:30PM.  Please bring copy of dc instuctions, all meds you are taking, and $20  copay if you are able.   Contact information:  [address] [place] [address] [phone]       Time spent on Discharge: 35 mins  Signed:   RAI,RIPUDEEP M.D. [facility] 05/16/2014, 12:19 PM Pager: [phone]

## 2014-05-16 NOTE — Telephone Encounter (Addendum)
-----   Message from Mena Goes, RN sent at 05/16/2014 11:42 AM EST ----- Regarding: Schedule    ----- Message -----    From: Rosetta Posner, MD    Sent: 05/16/2014   9:27 AM      To: [facility] Charge Pool  Low-level hospital follow-up. Needs to have an office visit with me in one month with ABI for follow-up of right iliac stent  notified patient's wife of post procedure appt. on 06-19-14 at 3:00 pm

## 2014-05-16 NOTE — Evaluation (Signed)
Physical Therapy Evaluation Patient Details Name: Dustin Freeman MRN: [id number] DOB: 28-Apr-1955 Today's Date: 05/16/2014   History of Present Illness  59 yo male with onset of R leg numbness and syncopal episode, resulting in a stent procedure for R external iliac artery.  Also note leukocytosis, cough, congestion, SMOKER, hyperglycemia, anemia, no real PMHx.  Not insured.  Clinical Impression  Pt was seen for evaluation of mobility with plan in place for him to get home today if able.  He is at S level with IV pole to maneuver, recommending SPC and HHPT to follow up with wife at home to assist him 24/7 as he recovers his strength.  Pt is cooperative and motivated to get back to work.    Follow Up Recommendations Home health PT;Supervision/Assistance - 24 hour    Equipment Recommendations  Cane    Recommendations for Other Services       Precautions / Restrictions Precautions Precautions: None Restrictions Weight Bearing Restrictions: No      Mobility  Bed Mobility Overal bed mobility: Modified Independent             General bed mobility comments: using no bedrail but HOB elevated  Transfers Overall transfer level: Modified independent Equipment used:  (IV pole for support)             General transfer comment: Pt is a bit impulsive and tends to stand very quickly although unsteady initially  Ambulation/Gait Ambulation/Gait assistance: Min guard Ambulation Distance (Feet): 175 Feet Assistive device:  (IV pole) Gait Pattern/deviations: Step-through pattern;Decreased step length - right;Decreased step length - left;Decreased stride length;Wide base of support;Drifts right/left Gait velocity: slow Gait velocity interpretation: Below normal speed for age/gender    Stairs Stairs: Yes Stairs assistance: Min guard Stair Management: One rail Right;Forwards Number of Stairs: 8 General stair comments: pt has stairs to enter his house and needed to be assisted  to manage IV line but with hand rail is safe  Wheelchair Mobility    Modified Rankin (Stroke Patients Only)       Balance Overall balance assessment: Needs assistance Sitting-balance support: Feet supported Sitting balance-Leahy Scale: Good   Postural control: Posterior lean Standing balance support: Single extremity supported Standing balance-Leahy Scale: Fair Standing balance comment: fair- dynamic standing balance with need for IV pole to steady                             Pertinent Vitals/Pain Pain Assessment: 0-10 Pain Score: 7  Pain Location: R groin Pain Intervention(s): Limited activity within patient's tolerance;Monitored during session;Premedicated before session;Repositioned  BP was 101/73, pulse 83 and O2 sats 93% per nsg notes.    Home Living Family/patient expects to be discharged to:: Private residence Living Arrangements: Spouse/significant other Available Help at Discharge: Family Type of Home: House Home Access: Stairs to enter Entrance Stairs-Rails: None Technical brewer of Steps: 3 Home Layout: One level Home Equipment: None      Prior Function Level of Independence: Independent               Hand Dominance        Extremity/Trunk Assessment   Upper Extremity Assessment: Overall WFL for tasks assessed           Lower Extremity Assessment: Generalized weakness      Cervical / Trunk Assessment: Normal  Communication   Communication: No difficulties  Cognition Arousal/Alertness: Awake/alert Behavior During Therapy: WFL for tasks assessed/performed Overall Cognitive  Status: Within Functional Limits for tasks assessed                      General Comments General comments (skin integrity, edema, etc.): Pt is in fairly fragile state and looks like he has not been feeling well for awhile.  He is in agreement to use a cane and would benefit from HHPT to increase his LE strength but is not insured.   Recommend the services if possible.    Exercises        Assessment/Plan    PT Assessment All further PT needs can be met in the next venue of care  PT Diagnosis Difficulty walking;Acute pain   PT Problem List Decreased strength;Decreased range of motion;Decreased activity tolerance;Decreased balance;Decreased mobility;Decreased coordination;Decreased knowledge of use of DME;Decreased safety awareness;Decreased knowledge of precautions;Decreased skin integrity;Pain  PT Treatment Interventions     PT Goals (Current goals can be found in the Care Plan section) Acute Rehab PT Goals Patient Stated Goal: to get home and back to work PT Goal Formulation: With patient Time For Goal Achievement: 05/25/14 Potential to Achieve Goals: Good    Frequency     Barriers to discharge        Co-evaluation               End of Session Equipment Utilized During Treatment: Other (comment) (IV pole) Activity Tolerance: Patient limited by pain Patient left: in chair;with call bell/phone within reach Nurse Communication: Mobility status;Other (comment) (Case manager informed about equipment and servides)    Functional Assessment Tool Used: clinical judgment Functional Limitation: Mobility: Walking and moving around Mobility: Walking and Moving Around Current Status [phone]): At least 20 percent but less than 40 percent impaired, limited or restricted Mobility: Walking and Moving Around Goal Status [phone]): At least 1 percent but less than 20 percent impaired, limited or restricted    Time: 1021-1048 PT Time Calculation (min) (ACUTE ONLY): 27 min   Charges:   PT Evaluation $Initial PT Evaluation Tier I: 1 Procedure PT Treatments $Gait Training: 8-22 mins   PT G Codes:   PT G-Codes **NOT FOR INPATIENT CLASS** Functional Assessment Tool Used: clinical judgment Functional Limitation: Mobility: Walking and moving around Mobility: Walking and Moving Around Current Status (R1594): At least  20 percent but less than 40 percent impaired, limited or restricted Mobility: Walking and Moving Around Goal Status [phone]): At least 1 percent but less than 20 percent impaired, limited or restricted    Ramond Dial 05/16/2014, 11:27 AM   Mee Hives, PT MS Acute Rehab Dept. Number: [phone]

## 2014-05-24 ENCOUNTER — Inpatient Hospital Stay: Payer: Self-pay | Admitting: Family Medicine

## 2014-06-18 ENCOUNTER — Encounter: Payer: Self-pay | Admitting: Vascular Surgery

## 2014-06-19 ENCOUNTER — Ambulatory Visit: Payer: Self-pay | Admitting: Vascular Surgery

## 2014-06-19 ENCOUNTER — Encounter (HOSPITAL_COMMUNITY): Payer: Self-pay

## 2014-06-20 ENCOUNTER — Inpatient Hospital Stay (HOSPITAL_COMMUNITY)
Admission: EM | Admit: 2014-06-20 | Discharge: 2014-07-10 | DRG: 871 | Disposition: A | Discharge status: Skilled Nursing Facility | Payer: Medicaid Other | Attending: Internal Medicine | Admitting: Internal Medicine

## 2014-06-20 ENCOUNTER — Emergency Department (HOSPITAL_COMMUNITY): Payer: Medicaid Other

## 2014-06-20 ENCOUNTER — Encounter (HOSPITAL_COMMUNITY): Payer: Self-pay

## 2014-06-20 DIAGNOSIS — J449 Chronic obstructive pulmonary disease, unspecified: Secondary | ICD-10-CM | POA: Diagnosis present

## 2014-06-20 DIAGNOSIS — Z7902 Long term (current) use of antithrombotics/antiplatelets: Secondary | ICD-10-CM | POA: Diagnosis not present

## 2014-06-20 DIAGNOSIS — R571 Hypovolemic shock: Secondary | ICD-10-CM | POA: Diagnosis present

## 2014-06-20 DIAGNOSIS — E872 Acidosis: Secondary | ICD-10-CM | POA: Diagnosis not present

## 2014-06-20 DIAGNOSIS — C3431 Malignant neoplasm of lower lobe, right bronchus or lung: Secondary | ICD-10-CM

## 2014-06-20 DIAGNOSIS — A419 Sepsis, unspecified organism: Secondary | ICD-10-CM | POA: Diagnosis present

## 2014-06-20 DIAGNOSIS — E871 Hypo-osmolality and hyponatremia: Secondary | ICD-10-CM | POA: Diagnosis present

## 2014-06-20 DIAGNOSIS — R109 Unspecified abdominal pain: Secondary | ICD-10-CM

## 2014-06-20 DIAGNOSIS — C3491 Malignant neoplasm of unspecified part of right bronchus or lung: Secondary | ICD-10-CM | POA: Insufficient documentation

## 2014-06-20 DIAGNOSIS — J189 Pneumonia, unspecified organism: Secondary | ICD-10-CM | POA: Diagnosis present

## 2014-06-20 DIAGNOSIS — R6521 Severe sepsis with septic shock: Secondary | ICD-10-CM

## 2014-06-20 DIAGNOSIS — D630 Anemia in neoplastic disease: Secondary | ICD-10-CM | POA: Diagnosis present

## 2014-06-20 DIAGNOSIS — Z79899 Other long term (current) drug therapy: Secondary | ICD-10-CM | POA: Diagnosis not present

## 2014-06-20 DIAGNOSIS — J188 Other pneumonia, unspecified organism: Secondary | ICD-10-CM | POA: Diagnosis present

## 2014-06-20 DIAGNOSIS — R001 Bradycardia, unspecified: Secondary | ICD-10-CM | POA: Diagnosis present

## 2014-06-20 DIAGNOSIS — E860 Dehydration: Secondary | ICD-10-CM | POA: Diagnosis present

## 2014-06-20 DIAGNOSIS — E1151 Type 2 diabetes mellitus with diabetic peripheral angiopathy without gangrene: Secondary | ICD-10-CM | POA: Diagnosis present

## 2014-06-20 DIAGNOSIS — E1165 Type 2 diabetes mellitus with hyperglycemia: Secondary | ICD-10-CM | POA: Diagnosis not present

## 2014-06-20 DIAGNOSIS — T380X5A Adverse effect of glucocorticoids and synthetic analogues, initial encounter: Secondary | ICD-10-CM | POA: Diagnosis not present

## 2014-06-20 DIAGNOSIS — R197 Diarrhea, unspecified: Secondary | ICD-10-CM | POA: Diagnosis present

## 2014-06-20 DIAGNOSIS — E43 Unspecified severe protein-calorie malnutrition: Secondary | ICD-10-CM | POA: Diagnosis present

## 2014-06-20 DIAGNOSIS — Z95820 Peripheral vascular angioplasty status with implants and grafts: Secondary | ICD-10-CM | POA: Diagnosis not present

## 2014-06-20 DIAGNOSIS — C3400 Malignant neoplasm of unspecified main bronchus: Secondary | ICD-10-CM | POA: Insufficient documentation

## 2014-06-20 DIAGNOSIS — J918 Pleural effusion in other conditions classified elsewhere: Secondary | ICD-10-CM | POA: Diagnosis present

## 2014-06-20 DIAGNOSIS — Z681 Body mass index (BMI) 19 or less, adult: Secondary | ICD-10-CM

## 2014-06-20 DIAGNOSIS — Z9889 Other specified postprocedural states: Secondary | ICD-10-CM | POA: Diagnosis present

## 2014-06-20 DIAGNOSIS — R0902 Hypoxemia: Secondary | ICD-10-CM | POA: Diagnosis present

## 2014-06-20 DIAGNOSIS — C3401 Malignant neoplasm of right main bronchus: Secondary | ICD-10-CM | POA: Diagnosis present

## 2014-06-20 DIAGNOSIS — I959 Hypotension, unspecified: Secondary | ICD-10-CM

## 2014-06-20 DIAGNOSIS — C801 Malignant (primary) neoplasm, unspecified: Secondary | ICD-10-CM | POA: Diagnosis present

## 2014-06-20 DIAGNOSIS — Z833 Family history of diabetes mellitus: Secondary | ICD-10-CM

## 2014-06-20 DIAGNOSIS — E876 Hypokalemia: Secondary | ICD-10-CM | POA: Diagnosis present

## 2014-06-20 DIAGNOSIS — J431 Panlobular emphysema: Secondary | ICD-10-CM | POA: Diagnosis present

## 2014-06-20 DIAGNOSIS — J9819 Other pulmonary collapse: Secondary | ICD-10-CM | POA: Diagnosis present

## 2014-06-20 DIAGNOSIS — Z87891 Personal history of nicotine dependence: Secondary | ICD-10-CM | POA: Diagnosis not present

## 2014-06-20 DIAGNOSIS — J90 Pleural effusion, not elsewhere classified: Secondary | ICD-10-CM | POA: Diagnosis present

## 2014-06-20 DIAGNOSIS — E222 Syndrome of inappropriate secretion of antidiuretic hormone: Secondary | ICD-10-CM | POA: Diagnosis not present

## 2014-06-20 DIAGNOSIS — D638 Anemia in other chronic diseases classified elsewhere: Secondary | ICD-10-CM | POA: Insufficient documentation

## 2014-06-20 DIAGNOSIS — Y95 Nosocomial condition: Secondary | ICD-10-CM | POA: Diagnosis present

## 2014-06-20 DIAGNOSIS — R918 Other nonspecific abnormal finding of lung field: Secondary | ICD-10-CM | POA: Diagnosis present

## 2014-06-20 DIAGNOSIS — I9589 Other hypotension: Secondary | ICD-10-CM | POA: Diagnosis present

## 2014-06-20 DIAGNOSIS — C3490 Malignant neoplasm of unspecified part of unspecified bronchus or lung: Secondary | ICD-10-CM

## 2014-06-20 DIAGNOSIS — C3410 Malignant neoplasm of upper lobe, unspecified bronchus or lung: Secondary | ICD-10-CM

## 2014-06-20 HISTORY — DX: Malignant (primary) neoplasm, unspecified: C80.1

## 2014-06-20 HISTORY — DX: Systemic involvement of connective tissue, unspecified: M35.9

## 2014-06-20 HISTORY — DX: Peripheral vascular disease, unspecified: I73.9

## 2014-06-20 HISTORY — DX: Type 2 diabetes mellitus without complications: E11.9

## 2014-06-20 LAB — URINALYSIS, ROUTINE W REFLEX MICROSCOPIC
GLUCOSE, UA: NEGATIVE mg/dL
HGB URINE DIPSTICK: NEGATIVE
Ketones, ur: NEGATIVE mg/dL
Leukocytes, UA: NEGATIVE
Nitrite: NEGATIVE
Protein, ur: NEGATIVE mg/dL
Specific Gravity, Urine: 1.023 (ref 1.005–1.030)
UROBILINOGEN UA: 4 mg/dL — AB (ref 0.0–1.0)
pH: 6 (ref 5.0–8.0)

## 2014-06-20 LAB — CBC WITH DIFFERENTIAL/PLATELET
Basophils Absolute: 0 10*3/uL (ref 0.0–0.1)
Basophils Relative: 0 % (ref 0–1)
EOS ABS: 0 10*3/uL (ref 0.0–0.7)
Eosinophils Relative: 0 % (ref 0–5)
HCT: 30.4 % — ABNORMAL LOW (ref 39.0–52.0)
HEMOGLOBIN: 10.1 g/dL — AB (ref 13.0–17.0)
LYMPHS ABS: 1 10*3/uL (ref 0.7–4.0)
LYMPHS PCT: 7 % — AB (ref 12–46)
MCH: 28.9 pg (ref 26.0–34.0)
MCHC: 33.2 g/dL (ref 30.0–36.0)
MCV: 87.1 fL (ref 78.0–100.0)
MONOS PCT: 5 % (ref 3–12)
Monocytes Absolute: 0.7 10*3/uL (ref 0.1–1.0)
NEUTROS PCT: 88 % — AB (ref 43–77)
Neutro Abs: 12.3 10*3/uL — ABNORMAL HIGH (ref 1.7–7.7)
Platelets: 417 10*3/uL — ABNORMAL HIGH (ref 150–400)
RBC: 3.49 MIL/uL — AB (ref 4.22–5.81)
RDW: 14.3 % (ref 11.5–15.5)
WBC: 14.1 10*3/uL — ABNORMAL HIGH (ref 4.0–10.5)

## 2014-06-20 LAB — COMPREHENSIVE METABOLIC PANEL
ALT: 28 U/L (ref 0–53)
AST: 30 U/L (ref 0–37)
Albumin: 1.6 g/dL — ABNORMAL LOW (ref 3.5–5.2)
Alkaline Phosphatase: 75 U/L (ref 39–117)
Anion gap: 6 (ref 5–15)
BUN: 9 mg/dL (ref 6–23)
CALCIUM: 6.8 mg/dL — AB (ref 8.4–10.5)
CO2: 21 mmol/L (ref 19–32)
CREATININE: 0.63 mg/dL (ref 0.50–1.35)
Chloride: 102 mmol/L (ref 96–112)
GLUCOSE: 106 mg/dL — AB (ref 70–99)
Potassium: 4.6 mmol/L (ref 3.5–5.1)
Sodium: 129 mmol/L — ABNORMAL LOW (ref 135–145)
Total Bilirubin: 0.9 mg/dL (ref 0.3–1.2)
Total Protein: 5 g/dL — ABNORMAL LOW (ref 6.0–8.3)

## 2014-06-20 LAB — GLUCOSE, CAPILLARY
GLUCOSE-CAPILLARY: 95 mg/dL (ref 70–99)
Glucose-Capillary: 93 mg/dL (ref 70–99)

## 2014-06-20 LAB — I-STAT TROPONIN, ED: TROPONIN I, POC: 0.02 ng/mL (ref 0.00–0.08)

## 2014-06-20 LAB — LACTIC ACID, PLASMA: Lactic Acid, Venous: 2.2 mmol/L (ref 0.5–2.0)

## 2014-06-20 LAB — LIPASE, BLOOD: Lipase: 29 U/L (ref 11–59)

## 2014-06-20 LAB — I-STAT CG4 LACTIC ACID, ED: LACTIC ACID, VENOUS: 2.07 mmol/L — AB (ref 0.5–2.0)

## 2014-06-20 LAB — MRSA PCR SCREENING: MRSA by PCR: NEGATIVE

## 2014-06-20 LAB — TROPONIN I

## 2014-06-20 MED ORDER — HEPARIN SODIUM (PORCINE) 5000 UNIT/ML IJ SOLN
5000.0000 [IU] | Freq: Three times a day (TID) | INTRAMUSCULAR | Status: AC
  Administered 2014-06-20 – 2014-07-05 (×45): 5000 [IU] via SUBCUTANEOUS
  Filled 2014-06-20 (×51): qty 1

## 2014-06-20 MED ORDER — CHLORHEXIDINE GLUCONATE 0.12 % MT SOLN
15.0000 mL | Freq: Two times a day (BID) | OROMUCOSAL | Status: DC
  Administered 2014-06-20 – 2014-07-10 (×36): 15 mL via OROMUCOSAL
  Filled 2014-06-20 (×42): qty 15

## 2014-06-20 MED ORDER — VANCOMYCIN HCL 500 MG IV SOLR
500.0000 mg | Freq: Three times a day (TID) | INTRAVENOUS | Status: DC
  Administered 2014-06-20 – 2014-06-22 (×6): 500 mg via INTRAVENOUS
  Filled 2014-06-20 (×8): qty 500

## 2014-06-20 MED ORDER — SODIUM CHLORIDE 0.9 % IV SOLN: 250.0000 mL | INTRAVENOUS | Status: DC | PRN

## 2014-06-20 MED ORDER — SODIUM CHLORIDE 0.9 % IV SOLN
1000.0000 mL | INTRAVENOUS | Status: DC
  Administered 2014-06-20: 1000 mL via INTRAVENOUS

## 2014-06-20 MED ORDER — INSULIN ASPART 100 UNIT/ML ~~LOC~~ SOLN
0.0000 [IU] | Freq: Three times a day (TID) | SUBCUTANEOUS | Status: DC
  Administered 2014-06-21: 2 [IU] via SUBCUTANEOUS
  Administered 2014-06-24 – 2014-06-26 (×4): 1 [IU] via SUBCUTANEOUS
  Administered 2014-06-26: 2 [IU] via SUBCUTANEOUS

## 2014-06-20 MED ORDER — ONDANSETRON HCL 4 MG/2ML IJ SOLN
4.0000 mg | Freq: Once | INTRAMUSCULAR | Status: AC
  Administered 2014-06-20: 4 mg via INTRAVENOUS
  Filled 2014-06-20: qty 2

## 2014-06-20 MED ORDER — CLOPIDOGREL BISULFATE 75 MG PO TABS
75.0000 mg | ORAL_TABLET | Freq: Every day | ORAL | Status: DC
  Administered 2014-06-21 – 2014-07-01 (×11): 75 mg via ORAL
  Filled 2014-06-20 (×12): qty 1

## 2014-06-20 MED ORDER — LEVOFLOXACIN IN D5W 750 MG/150ML IV SOLN: 750.0000 mg | INTRAVENOUS | Status: DC

## 2014-06-20 MED ORDER — DEXTROSE 5 % IV SOLN: 1.0000 g | Freq: Three times a day (TID) | INTRAVENOUS | Status: DC

## 2014-06-20 MED ORDER — SODIUM CHLORIDE 0.9 % IV SOLN
1000.0000 mL | Freq: Once | INTRAVENOUS | Status: AC
  Administered 2014-06-20: 1000 mL via INTRAVENOUS

## 2014-06-20 MED ORDER — LEVOFLOXACIN IN D5W 750 MG/150ML IV SOLN
750.0000 mg | Freq: Every day | INTRAVENOUS | Status: DC
  Administered 2014-06-20: 750 mg via INTRAVENOUS
  Filled 2014-06-20 (×2): qty 150

## 2014-06-20 MED ORDER — CEFEPIME HCL 2 G IJ SOLR
2.0000 g | Freq: Once | INTRAMUSCULAR | Status: AC
  Administered 2014-06-20: 2 g via INTRAVENOUS
  Filled 2014-06-20: qty 2

## 2014-06-20 MED ORDER — VANCOMYCIN HCL IN DEXTROSE 1-5 GM/200ML-% IV SOLN
1000.0000 mg | Freq: Once | INTRAVENOUS | Status: AC
Start: 2014-06-20 — End: 2014-06-20
  Administered 2014-06-20: 1000 mg via INTRAVENOUS
  Filled 2014-06-20: qty 200

## 2014-06-20 MED ORDER — SODIUM CHLORIDE 0.9 % IV BOLUS (SEPSIS): 1000.0000 mL | Freq: Once | INTRAVENOUS | Status: DC

## 2014-06-20 MED ORDER — CETYLPYRIDINIUM CHLORIDE 0.05 % MT LIQD
7.0000 mL | Freq: Two times a day (BID) | OROMUCOSAL | Status: DC
  Administered 2014-06-21 – 2014-07-10 (×31): 7 mL via OROMUCOSAL

## 2014-06-20 MED ORDER — SODIUM CHLORIDE 0.9 % IV BOLUS (SEPSIS)
1000.0000 mL | Freq: Once | INTRAVENOUS | Status: AC
  Administered 2014-06-20: 1000 mL via INTRAVENOUS

## 2014-06-20 MED ORDER — SODIUM CHLORIDE 0.9 % IV SOLN
INTRAVENOUS | Status: DC
  Administered 2014-06-20 – 2014-06-29 (×10): via INTRAVENOUS

## 2014-06-20 NOTE — H&P (Signed)
PULMONARY / CRITICAL CARE MEDICINE   Name: Dustin Freeman MRN: [id number] DOB: [birth year]    ADMISSION DATE:  06/20/2014 CONSULTATION DATE:  06/20/2014  REFERRING MD :  EDP  CHIEF COMPLAINT:  Dizziness, cough, diarrhea  INITIAL PRESENTATION:  59 y.o. M brought to [facility] ED 3/2 for dizziness, cough, diarrhea.   Hospitalized 3 weeks ago for syncope and underwent stent placement in occluded right external iliac artery at that time.  Ever since being discharged, has had diarrhea.  In ED, CXR c/w RLL consolidation and pt remained hypotensive despite 4L IVF.  PCCM consulted for admission.   STUDIES:  CXR 3/2 >>> RLL consolidation  SIGNIFICANT EVENTS: 1/24 - 1/27 - admitted for syncope.  Found to have PVD with RLE claudication due to occluded right external iliac artery.  Underwent stent placement 3/2 - admit with HCAP   HISTORY OF PRESENT ILLNESS:   Dustin Freeman is a 59 y.o. M with recently diagnosed DM, PVD s/p stent placement to right external iliac artery, who [facility] ED 3/2 for dizziness, cough, diarrhea.  Pt is apparently homeless and on day of presentation got very dizzy.  When EMS arrived, he was very cold and hypotensive.  He was placed on NRB and transported to ED.  In ED, CXR suggestive of RLL consolidation likely due to PNA and pt aggressively hydrated with 4 L IVF; however, BP yet to improve.  He was recently hospitalized 1/24 - 1/27 for syncope and underwent stent placement in occluded right external iliac artery at that time  He states ever since discharge, he has had daily diarrhea.  He has also had SOB and very thick cough that gets so bad that he ends up vomiting.  Denies fevers/chill/sweats, chest pain, nausea, abd pain, myalgias.  He is a smoker, used to smoke up to 2ppd x 30 - 40  Years but nowadays only smokes 1 cigarette per day as he is attempting to quit.   PAST MEDICAL HISTORY :   has a past medical history of History of palpitations; DM (diabetes mellitus);  and PVD (peripheral vascular disease).  has past surgical history that includes lower extremity angiogram (N/A, 05/15/2014). Prior to Admission medications   Medication Sig Start Date End Date Taking? Authorizing Provider  clopidogrel (PLAVIX) 75 MG tablet Take 1 tablet (75 mg total) by mouth daily. 05/16/14  Yes Ripudeep Krystal Eaton, MD  metFORMIN (GLUCOPHAGE) 500 MG tablet Take 1 tablet (500 mg total) by mouth daily with breakfast. 05/16/14  Yes Ripudeep K Rai, MD  pregabalin (LYRICA) 75 MG capsule Take 1 capsule (75 mg total) by mouth 2 (two) times daily. 05/16/14  Yes Ripudeep K Rai, MD  blood glucose meter kit and supplies KIT Diagnosis: diabetes mellitus. Dispense based on patient and insurance preference. Use up to four times daily as directed. (FOR ICD-9 250.00, 250.01). 05/16/14   Ripudeep K Rai, MD  nicotine (NICODERM CQ - DOSED IN MG/24 HOURS) 21 mg/24hr patch Place 1 patch (21 mg total) onto the skin daily. Patient not taking: Reported on 06/20/2014 05/16/14   Ripudeep Krystal Eaton, MD  oxyCODONE (OXY IR/ROXICODONE) 5 MG immediate release tablet Take 1 tablet (5 mg total) by mouth every 6 (six) hours as needed for moderate pain. Patient not taking: Reported on 06/20/2014 05/16/14   Ripudeep Krystal Eaton, MD   No Known Allergies  FAMILY HISTORY:  Family History  Problem Relation Age of Onset  . Diabetes Mellitus I Mother   . Peripheral vascular disease  Father   . Leukemia Maternal Grandfather   . Leukemia Maternal Uncle   . Kidney disease Cousin   . Breast cancer Cousin     SOCIAL HISTORY:  reports that he quit smoking about 4 weeks ago. His smoking use included Cigarettes. He has a 23.5 pack-year smoking history. He does not have any smokeless tobacco history on file. He reports that he does not drink alcohol or use illicit drugs.  REVIEW OF SYSTEMS:   All negative; except for those that are bolded, which indicate positives.  Constitutional: weight loss, weight gain, night sweats, fevers, chills,  fatigue, weakness.  HEENT: headaches, sore throat, sneezing, nasal congestion, post nasal drip, difficulty swallowing, tooth/dental problems, visual complaints, visual changes, ear aches. Neuro: difficulty with speech, weakness, numbness, ataxia, dizziness. CV:  chest pain, orthopnea, PND, swelling in lower extremities, dizziness, palpitations, syncope.  Resp: cough, hemoptysis, dyspnea, wheezing. GI  heartburn, indigestion, abdominal pain, nausea, vomiting, diarrhea, constipation, change in bowel habits, loss of appetite, hematemesis, melena, hematochezia.  GU: dysuria, change in color of urine, urgency or frequency, flank pain, hematuria. MSK: joint pain or swelling, decreased range of motion. Psych: change in mood or affect, depression, anxiety, suicidal ideations, homicidal ideations. Skin: rash, itching, bruising.   SUBJECTIVE:   VITAL SIGNS: Temp:  [98.2 F (36.8 C)-98.7 F (37.1 C)] 98.7 F (37.1 C) (03/02 1538) Pulse Rate:  [43-112] 112 (03/02 1600) Resp:  [15-29] 29 (03/02 1600) BP: (77-140)/(50-87) 86/62 mmHg (03/02 1600) SpO2:  [92 %-100 %] 98 % (03/02 1600) Weight:  [53 kg (116 lb 13.5 oz)] 53 kg (116 lb 13.5 oz) (03/02 1600) HEMODYNAMICS:   VENTILATOR SETTINGS:   INTAKE / OUTPUT: Intake/Output      03/01 0701 - 03/02 0700 03/02 0701 - 03/03 0700   Urine (mL/kg/hr)  600   Total Output   600   Net   -600          PHYSICAL EXAMINATION: General: Frail adult male, in NAD. Neuro: A&O x 3, non-focal.  HEENT: Dooms/AT. PERRL, sclerae anicteric. Cardiovascular: RRR, no M/R/G.  Lungs: Respirations even and unlabored.  Coarse crackles RLL with + bronchophony. Abdomen: BS x 4, soft, NT/ND.  Musculoskeletal: No gross deformities, no edema.  Skin: Intact, warm, no rashes.  LABS:  CBC  Recent Labs Lab 06/20/14 1337  WBC 14.1*  HGB 10.1*  HCT 30.4*  PLT 417*   Coag's No results for input(s): APTT, INR in the last 168 hours. BMET  Recent Labs Lab  06/20/14 1337  NA 129*  K 4.6  CL 102  CO2 21  BUN 9  CREATININE 0.63  GLUCOSE 106*   Electrolytes  Recent Labs Lab 06/20/14 1337  CALCIUM 6.8*   Sepsis Markers  Recent Labs Lab 06/20/14 1443  LATICACIDVEN 2.07*   ABG No results for input(s): PHART, PCO2ART, PO2ART in the last 168 hours. Liver Enzymes  Recent Labs Lab 06/20/14 1337  AST 30  ALT 28  ALKPHOS 75  BILITOT 0.9  ALBUMIN 1.6*   Cardiac Enzymes No results for input(s): TROPONINI, PROBNP in the last 168 hours. Glucose No results for input(s): GLUCAP in the last 168 hours.  Imaging Dg Abd Acute W/chest  06/20/2014   CLINICAL DATA:  Right upper quadrant pain, cough for weeks.  EXAM: ACUTE ABDOMEN SERIES (ABDOMEN 2 VIEW & CHEST 1 VIEW)  COMPARISON:  05/13/2014  FINDINGS: New airspace opacity noted in the right lower lobe concerning for pneumonia. Underlying COPD. Heart is normal size. Left lung is  clear. No effusions.  Mild diffuse gaseous distention of the colon. No evidence of bowel obstruction. No organomegaly or suspicious calcification.  IMPRESSION: Right lower lobe consolidation compatible with pneumonia.  COPD.   Electronically Signed   By: Rolm Baptise M.D.   On: 06/20/2014 13:11    ASSESSMENT / PLAN:  PULMONARY A: HCAP COPD by CXR - has never had PFT's Tobacco use disorder P:   No supplemental O2 needed at this time. Pulmonary hygiene. CXR in AM. Continue tobacco cessation efforts.  CARDIOVASCULAR A:  Shock - likely hypovolemic from heavy diarrhea + decreased PO, question early septic from HCAP PVD s/p stent placement to right external iliac artery 05/15/14 (Dr. Donnetta Hutching) P:  Additional 1000cc bolus now. May need CVL. If CVL placed, then will start levophed as needed to maintain goal MAP > 65 and obtain CVP's for goal 8 - 12. Repeat lactate, troponin. Continue outpatient Plavix.  RENAL A:   Hyponatremia Pseudohypocalcemia - corrects to 8.7 P:   NS @ 100. BMP in  AM.  GASTROINTESTINAL A:   Protein calorie malnutrition Nutrition Diarrhea P:   Nutrition consult for calorie counts / nutritional needs. Carb mod diet.  HEMATOLOGIC A:   Anemia VTE Prophylaxis P:  Transfuse for Hgb < 7. SCD's / Heparin. CBC in AM.  INFECTIOUS A:   HCAP R/o C.diff P:   BCx2 3/2 > UCx 3/2 > Sputum Cx 3/2 > C.Diff PCR 3/2 > Abx: Vanc, start date 3/2, day 1/x. Abx: Levaquin, start date 3/2, day 1/x.  ENDOCRINE A:   DM P:   SSI. Hold outpatient metformin.  NEUROLOGIC A:   No acute issues P:   No interventions required. Hold outpatient Lyrica.   Family updated: Wife at bedside.  Interdisciplinary Family Meeting v Palliative Care Meeting:  Due by: 3/8.   Montey Hora, [place] Pager: [phone]  or [phone] 06/20/2014, 4:40 PM  PCCM ATTENDING: I have reviewed pt's initial presentation, consultants notes and hospital database in detail.  The above assessment and plan was formulated under my direction.  In summary: Chief complaints of cough, weakness and findings of hypotension refractory to IVFs, RLL vague infiltrate, R basilar crackles and bronchophony. Admitted with diagnosis of severe sepsis with hypotension. There is no identified acute organ failure. He was briefly hospitalized last month. Will treat with vanc/levofloxacin and watch overnight in ICU. Vasopressors will be initiated if BP does not stabilize   Merton Border, MD;  PCCM service; Mobile [phone]

## 2014-06-20 NOTE — Progress Notes (Addendum)
ANTIBIOTIC CONSULT NOTE - INITIAL  Pharmacy Consult for vancomycin and cefepime>>d/c, change to Levaquin Indication: HCAP  No Known Allergies  Patient Measurements:    Weight: 53 kg on 05/16/14  Vital Signs: Temp: 98.2 F (36.8 C) (03/02 1125) Temp Source: Oral (03/02 1125) BP: 79/60 mmHg (03/02 1400) Pulse Rate: 43 (03/02 1400) Intake/Output from previous day:   Intake/Output from this shift:    Labs: No results for input(s): WBC, HGB, PLT, LABCREA, CREATININE in the last 72 hours. CrCl cannot be calculated (Unknown ideal weight.). No results for input(s): VANCOTROUGH, VANCOPEAK, VANCORANDOM, GENTTROUGH, GENTPEAK, GENTRANDOM, TOBRATROUGH, TOBRAPEAK, TOBRARND, AMIKACINPEAK, AMIKACINTROU, AMIKACIN in the last 72 hours.   Microbiology: No results found for this or any previous visit (from the past 720 hour(s)).  Medical History: Past Medical History  Diagnosis Date  . History of palpitations     During [employer] intake exam, not since    Medications:  Plavix, metformin, lyrica  Assessment: 59 yo M reported to be homeless, hypotensive and cold when found.  Pharmacy consulted to dose vancomycin and cefepime for HCAP.  Pt in hospital recently.  Admitted 05/13/14 with syncope/collapse and PVD with decreased pulse in RLE.  S/P iliac stent placement on 05/15/14 and new diagnosis of DM 2.  Wt 53 kg as of 05/16/14. AF.  No current labs yet, creat was 0.74 on 05/16/14.  Pt c/o multiple loose stools and vomiting recently and cough and L chest pain.   3/2 CXR: RLL consolidation c/w PNA  vanc 3/2>> Cefepime 3/2>>  3/2 Ucx>> 3/2 BCx2>> 3/2 cdiff>>  Goal of Therapy:  Vancomycin trough level 15-20 mcg/ml  Plan:  -cefepime 2 gm IV x 1 dose then cefepime 1 gm IV q8h -vancomycin 1 gm IV x 1 dose then vancomycin 500 mg IV q8h -f/u renal function, WBC, temp, culture data -steady-state vancomycin trough as needed  Eudelia Bunch, Pharm.D. [phone] 06/20/2014 2:25  PM  Addendum: Pharmacy has been asked to change cefepime to levaquin. He has already received a dose of vancomycin and cefepime in the ED.  Plan: 1) Levaquin 750mg  IV q24  Nena Jordan, PharmD, BCPS 06/20/2014, 4:41 PM

## 2014-06-20 NOTE — ED Provider Notes (Signed)
CSN: [id number]     Arrival date & time 06/20/14  1121 History   First MD Initiated Contact with Patient 06/20/14 1132     Chief Complaint  Patient presents with  . Diarrhea  . Weakness    HPI Pt was getting up this am. When he went to stand up he had a syncopal episode.  He thinks maybe 20-30 seconds.  He was sleeping in a tent last night.  He is going to get a home soon because his retirment is kicking in.  He has had multiple loose stools and some vomiting recently.  That has been going on ever since the left the hospital 3 weeks ago.  He has diarrhea 3-4 times at least daily.  He did vomit this am.  He has been vomiting a few times a day.  He hasw a cough and some pain in the left anterior chest and ruq.  No fevers.  He has felt weak and lightheaded.  EMS noted he bp low. Past Medical History  Diagnosis Date  . History of palpitations     During [employer] intake exam, not since   Past Surgical History  Procedure Laterality Date  . Lower extremity angiogram N/A 05/15/2014    Procedure: LOWER EXTREMITY ANGIOGRAM;  Surgeon: Serafina Mitchell, MD;  Location[facility] CATH LAB;  Service: Cardiovascular;  Laterality: N/A;   Family History  Problem Relation Age of Onset  . Diabetes Mellitus I Mother   . Peripheral vascular disease Father   . Leukemia Maternal Grandfather   . Leukemia Maternal Uncle   . Kidney disease Cousin   . Breast cancer Cousin    History  Substance Use Topics  . Smoking status: Former Smoker -- 0.50 packs/day for 47 years    Types: Cigarettes    Quit date: 05/22/2014  . Smokeless tobacco: Not on file  . Alcohol Use: No    Review of Systems  All other systems reviewed and are negative.     Allergies  Review of patient's allergies indicates no known allergies.  Home Medications   Prior to Admission medications   Medication Sig Start Date End Date Taking? Authorizing Provider  clopidogrel (PLAVIX) 75 MG tablet Take 1 tablet (75 mg total) by mouth daily. 05/16/14   Yes Ripudeep Krystal Eaton, MD  metFORMIN (GLUCOPHAGE) 500 MG tablet Take 1 tablet (500 mg total) by mouth daily with breakfast. 05/16/14  Yes Ripudeep K Rai, MD  pregabalin (LYRICA) 75 MG capsule Take 1 capsule (75 mg total) by mouth 2 (two) times daily. 05/16/14  Yes Ripudeep K Rai, MD  blood glucose meter kit and supplies KIT Diagnosis: diabetes mellitus. Dispense based on patient and insurance preference. Use up to four times daily as directed. (FOR ICD-9 250.00, 250.01). 05/16/14   Ripudeep K Rai, MD  nicotine (NICODERM CQ - DOSED IN MG/24 HOURS) 21 mg/24hr patch Place 1 patch (21 mg total) onto the skin daily. Patient not taking: Reported on 06/20/2014 05/16/14   Ripudeep Krystal Eaton, MD  oxyCODONE (OXY IR/ROXICODONE) 5 MG immediate release tablet Take 1 tablet (5 mg total) by mouth every 6 (six) hours as needed for moderate pain. Patient not taking: Reported on 06/20/2014 05/16/14   Ripudeep K Rai, MD   BP 97/58 mmHg  Pulse 43  Temp(Src) 98.2 F (36.8 C) (Oral)  Resp 26  SpO2 84% Physical Exam  Constitutional: No distress.  Thin   HENT:  Head: Normocephalic and atraumatic.  Right Ear: External ear normal.  Left  Ear: External ear normal.  Mouth/Throat: No oropharyngeal exudate (mm dry).  Eyes: Conjunctivae are normal. Right eye exhibits no discharge. Left eye exhibits no discharge. No scleral icterus.  Neck: Neck supple. No tracheal deviation present.  Cardiovascular: Normal rate, regular rhythm and intact distal pulses.   Pulmonary/Chest: Effort normal and breath sounds normal. No stridor. No respiratory distress. He has no wheezes. He has no rales.  Abdominal: Soft. Bowel sounds are normal. He exhibits no distension. There is tenderness in the right upper quadrant. There is no rigidity, no rebound, no guarding and no CVA tenderness.  Musculoskeletal: He exhibits no edema or tenderness.  Neurological: He is alert. He has normal strength. No cranial nerve deficit (no facial droop, extraocular movements  intact, no slurred speech) or sensory deficit. He exhibits normal muscle tone. He displays no seizure activity. Coordination normal.  Skin: Skin is warm and dry. No rash noted.  Psychiatric: He has a normal mood and affect.  Nursing note and vitals reviewed.   ED Course  Procedures (including critical care time) CRITICAL CARE Performed by: YIRSW,NIO Total critical care time: 40 Critical care time was exclusive of separately billable procedures and treating other patients. Critical care was necessary to treat or prevent imminent or life-threatening deterioration. Critical care was time spent personally by me on the following activities: development of treatment plan with patient and/or surrogate as well as nursing, discussions with consultants, evaluation of patient's response to treatment, examination of patient, obtaining history from patient or surrogate, ordering and performing treatments and interventions, ordering and review of laboratory studies, ordering and review of radiographic studies, pulse oximetry and re-evaluation of patient's condition.  Labs Review Labs Reviewed  CBC WITH DIFFERENTIAL/PLATELET - Abnormal; Notable for the following:    WBC 14.1 (*)    RBC 3.49 (*)    Hemoglobin 10.1 (*)    HCT 30.4 (*)    Platelets 417 (*)    Neutrophils Relative % 88 (*)    Neutro Abs 12.3 (*)    Lymphocytes Relative 7 (*)    All other components within normal limits  COMPREHENSIVE METABOLIC PANEL - Abnormal; Notable for the following:    Sodium 129 (*)    Glucose, Bld 106 (*)    Calcium 6.8 (*)    Total Protein 5.0 (*)    Albumin 1.6 (*)    All other components within normal limits  URINALYSIS, ROUTINE W REFLEX MICROSCOPIC - Abnormal; Notable for the following:    Color, Urine AMBER (*)    Bilirubin Urine SMALL (*)    Urobilinogen, UA 4.0 (*)    All other components within normal limits  I-STAT CG4 LACTIC ACID, ED - Abnormal; Notable for the following:    Lactic Acid, Venous  2.07 (*)    All other components within normal limits  CLOSTRIDIUM DIFFICILE BY PCR  CULTURE, BLOOD (ROUTINE X 2)  CULTURE, BLOOD (ROUTINE X 2)  URINE CULTURE  LIPASE, BLOOD  I-STAT TROPOININ, ED  I-STAT CG4 LACTIC ACID, ED    Imaging Review Dg Abd Acute W/chest  06/20/2014   CLINICAL DATA:  Right upper quadrant pain, cough for weeks.  EXAM: ACUTE ABDOMEN SERIES (ABDOMEN 2 VIEW & CHEST 1 VIEW)  COMPARISON:  05/13/2014  FINDINGS: New airspace opacity noted in the right lower lobe concerning for pneumonia. Underlying COPD. Heart is normal size. Left lung is clear. No effusions.  Mild diffuse gaseous distention of the colon. No evidence of bowel obstruction. No organomegaly or suspicious calcification.  IMPRESSION:  Right lower lobe consolidation compatible with pneumonia.  COPD.   Electronically Signed   By: Rolm Baptise M.D.   On: 06/20/2014 13:11     EKG Interpretation   Date/Time:  Wednesday June 20 2014 13:57:26 EST Ventricular Rate:  75 PR Interval:  156 QRS Duration: 89 QT Interval:  422 QTC Calculation: 471 R Axis:   93 Text Interpretation:  Sinus rhythm Ventricular premature complex Aberrant  conduction of SV complex(es) Right axis deviation Low voltage, extremity  and precordial leads Nonspecific T abnormalities, lateral leads Artifact  Poor data quality in current ECG precludes serial comparison Confirmed by  Sylvanna Burggraf  MD-J, Ajiah Mcglinn ([id number]) on 06/20/2014 2:04:00 PM     Medications  0.9 %  sodium chloride infusion (0 mLs Intravenous Stopped 06/20/14 1345)    Followed by  0.9 %  sodium chloride infusion (0 mLs Intravenous Stopped 06/20/14 1345)    Followed by  0.9 %  sodium chloride infusion (1,000 mLs Intravenous New Bag/Given 06/20/14 1345)  vancomycin (VANCOCIN) IVPB 1000 mg/200 mL premix (not administered)  ceFEPIme (MAXIPIME) 1 g in dextrose 5 % 50 mL IVPB (not administered)  vancomycin (VANCOCIN) 500 mg in sodium chloride 0.9 % 100 mL IVPB (not administered)  ondansetron  (ZOFRAN) injection 4 mg (4 mg Intravenous Given 06/20/14 1202)  0.9 %  sodium chloride infusion (1,000 mLs Intravenous New Bag/Given 06/20/14 1411)    Followed by  0.9 %  sodium chloride infusion (1,000 mLs Intravenous New Bag/Given 06/20/14 1411)  ceFEPIme (MAXIPIME) 2 g in dextrose 5 % 50 mL IVPB (0 g Intravenous Stopped 06/20/14 1501)    MDM   Final diagnoses:  HCAP (healthcare-associated pneumonia)  Sepsis, due to unspecified organism    Pt's BP improved after intial fluid bolus.  BP has now dropped again in the 40J systolic.  Suspect this may be related to dehydration.  Sepsis is a consideration. Labs are still pending.  Will give an additional 2 liter bolus and monitor closely.  Initiate code sepsis.  Will start empiric abx for HCAP.  Blood cx and urine cultures added  1500 BP now 97/50 at the bedside.  Still with borderline BPs.  4l NS has been ordered.  Will consult with PCCM.  Pt may end up requiring pressors if his blood pressure does not hold.    Dorie Rank, MD 06/21/14 [phone]

## 2014-06-20 NOTE — ED Notes (Signed)
Tomi Bamberger, MD notified of abnormal lab test results

## 2014-06-20 NOTE — ED Notes (Signed)
[facility]- pt is homeless, has been outside. Hypotensive and cold when found. Radial pulses absent. Pt placed on NRB and given 300cc of fluid. Reports surgery 3 weeks ago, with diarrhea and weakness X2 weeks. Reports off and on diarrhea with vomiting as well.

## 2014-06-20 NOTE — ED Notes (Signed)
Critical care at bedside  

## 2014-06-20 NOTE — Progress Notes (Signed)
CRITICAL VALUE ALERT  Critical value received:  Lactic Acid 2.2  Date of notification:  06/20/14  Time of notification:  1810   Critical value read back:Yes.    Nurse who received alert:  Charmayne Sheer   MD notified (1st page):  simonds  Time of first page:  1810  MD notified (2nd page):  Time of second page:  Responding MD:  simonds  Time MD responded:  1810

## 2014-06-21 ENCOUNTER — Inpatient Hospital Stay (HOSPITAL_COMMUNITY): Payer: Medicaid Other

## 2014-06-21 DIAGNOSIS — R0902 Hypoxemia: Secondary | ICD-10-CM | POA: Diagnosis present

## 2014-06-21 LAB — EXPECTORATED SPUTUM ASSESSMENT W REFEX TO RESP CULTURE

## 2014-06-21 LAB — POCT I-STAT 3, ART BLOOD GAS (G3+)
Acid-base deficit: 7 mmol/L — ABNORMAL HIGH (ref 0.0–2.0)
Bicarbonate: 16 mEq/L — ABNORMAL LOW (ref 20.0–24.0)
O2 Saturation: 99 %
PCO2 ART: 23.5 mmHg — AB (ref 35.0–45.0)
PO2 ART: 114 mmHg — AB (ref 80.0–100.0)
Patient temperature: 98.5
TCO2: 17 mmol/L (ref 0–100)
pH, Arterial: 7.439 (ref 7.350–7.450)

## 2014-06-21 LAB — BASIC METABOLIC PANEL
Anion gap: 4 — ABNORMAL LOW (ref 5–15)
BUN: 6 mg/dL (ref 6–23)
CO2: 17 mmol/L — ABNORMAL LOW (ref 19–32)
Calcium: 6.6 mg/dL — ABNORMAL LOW (ref 8.4–10.5)
Chloride: 108 mmol/L (ref 96–112)
Creatinine, Ser: 0.54 mg/dL (ref 0.50–1.35)
GFR calc Af Amer: >90 mL/min (ref >90)
GFR calc non Af Amer: >90 mL/min (ref >90)
GLUCOSE: 87 mg/dL (ref 70–99)
POTASSIUM: 3.9 mmol/L (ref 3.5–5.1)
Sodium: 129 mmol/L — ABNORMAL LOW (ref 135–145)

## 2014-06-21 LAB — GLUCOSE, CAPILLARY
GLUCOSE-CAPILLARY: 87 mg/dL (ref 70–99)
GLUCOSE-CAPILLARY: 91 mg/dL (ref 70–99)
GLUCOSE-CAPILLARY: 93 mg/dL (ref 70–99)
Glucose-Capillary: 112 mg/dL — ABNORMAL HIGH (ref 70–99)
Glucose-Capillary: 151 mg/dL — ABNORMAL HIGH (ref 70–99)

## 2014-06-21 LAB — CBC
HEMATOCRIT: 28.5 % — AB (ref 39.0–52.0)
HEMOGLOBIN: 9.6 g/dL — AB (ref 13.0–17.0)
MCH: 29.4 pg (ref 26.0–34.0)
MCHC: 33.7 g/dL (ref 30.0–36.0)
MCV: 87.4 fL (ref 78.0–100.0)
PLATELETS: 394 10*3/uL (ref 150–400)
RBC: 3.26 MIL/uL — ABNORMAL LOW (ref 4.22–5.81)
RDW: 14.4 % (ref 11.5–15.5)
WBC: 12.5 10*3/uL — ABNORMAL HIGH (ref 4.0–10.5)

## 2014-06-21 LAB — AMYLASE: Amylase: 50 U/L (ref 0–105)

## 2014-06-21 LAB — URINE CULTURE
COLONY COUNT: NO GROWTH
Culture: NO GROWTH

## 2014-06-21 LAB — MAGNESIUM: Magnesium: 1.8 mg/dL (ref 1.5–2.5)

## 2014-06-21 LAB — EXPECTORATED SPUTUM ASSESSMENT W GRAM STAIN, RFLX TO RESP C

## 2014-06-21 LAB — LIPASE, BLOOD: LIPASE: 21 U/L (ref 11–59)

## 2014-06-21 LAB — TSH: TSH: 1.192 u[IU]/mL (ref 0.350–4.500)

## 2014-06-21 LAB — LACTATE DEHYDROGENASE: LDH: 186 U/L (ref 94–250)

## 2014-06-21 LAB — PHOSPHORUS: PHOSPHORUS: 2.6 mg/dL (ref 2.3–4.6)

## 2014-06-21 MED ORDER — ADULT MULTIVITAMIN W/MINERALS CH
1.0000 | ORAL_TABLET | Freq: Every day | ORAL | Status: DC
  Administered 2014-06-21 – 2014-07-10 (×20): 1 via ORAL
  Filled 2014-06-21 (×20): qty 1

## 2014-06-21 MED ORDER — DEXTROSE 5 % IV SOLN
1.0000 g | Freq: Three times a day (TID) | INTRAVENOUS | Status: DC
  Administered 2014-06-21 – 2014-06-22 (×3): 1 g via INTRAVENOUS
  Filled 2014-06-21 (×5): qty 1

## 2014-06-21 MED ORDER — SODIUM CHLORIDE 0.9 % IV BOLUS (SEPSIS)
1000.0000 mL | Freq: Once | INTRAVENOUS | Status: AC
  Administered 2014-06-21: 1000 mL via INTRAVENOUS

## 2014-06-21 MED ORDER — MAGNESIUM SULFATE 2 GM/50ML IV SOLN
2.0000 g | Freq: Once | INTRAVENOUS | Status: AC
  Administered 2014-06-21: 2 g via INTRAVENOUS
  Filled 2014-06-21: qty 50

## 2014-06-21 MED ORDER — GLUCERNA SHAKE PO LIQD
237.0000 mL | Freq: Three times a day (TID) | ORAL | Status: DC
  Administered 2014-06-21 – 2014-06-28 (×13): 237 mL via ORAL
  Filled 2014-06-21 (×2): qty 237

## 2014-06-21 MED ORDER — IPRATROPIUM-ALBUTEROL 0.5-2.5 (3) MG/3ML IN SOLN
3.0000 mL | RESPIRATORY_TRACT | Status: DC
  Administered 2014-06-21 – 2014-06-22 (×4): 3 mL via RESPIRATORY_TRACT
  Filled 2014-06-21 (×4): qty 3

## 2014-06-21 NOTE — Clinical Social Work Note (Signed)
CSW consult acknowledged:  Clinical Social Worker has received a consult for Homelessness. CSW to complete psychosocial assessment. CSW to follow.   Glendon Axe, MSW, LCSWA [phone] 06/21/2014 2:36 PM

## 2014-06-21 NOTE — Progress Notes (Signed)
UR Completed.  [phone]  

## 2014-06-21 NOTE — Progress Notes (Signed)
eLink Physician-Brief Progress Note Patient Name: Dustin Freeman DOB: Jul 25, 1955 MRN: [id number]   Date of Service  06/21/2014  HPI/Events of Note  Called d/t increased O2 requirement. Now in 100% NRBM = 7.43/23/114/16. RR = 24, HR = 72. DDx: 1. Mucous plug vs 2. Worsening PNA vs 3. Bronchospasm.   eICU Interventions  Will order:  1. CXR STAT.  2. Duonebs now and Q 4 hours. 3. Hold transfer to Step Down Unit.      Intervention Category Major Interventions: Hypoxemia - evaluation and management  Amorita Vanrossum,Lerry Eugene 06/21/2014, 4:11 PM

## 2014-06-21 NOTE — Care Management Note (Unsigned)
    Page 1 of 1   06/25/2014     11:42:17 AM CARE MANAGEMENT NOTE 06/25/2014  Patient:  Dustin Freeman, Dustin Freeman   Account Number:  [id number]  Date Initiated:  06/21/2014  Documentation initiated by:  Luz Lex  Subjective/Objective Assessment:   Admitted with suspect pnuemonia - hypotension - fluid bolus's     Action/Plan:   Anticipated DC Date:  06/28/2014   Anticipated DC Plan:  [place]  CM consult      Choice offered to / List presented to:             Status of service:   Medicare Important Message given?  NO (If response is "NO", the following Medicare IM given date fields will be blank) Date Medicare IM given:   Medicare IM given by:   Date Additional Medicare IM given:   Additional Medicare IM given by:    Discharge Disposition:    Per UR Regulation:  Reviewed for med. necessity/level of care/duration of stay  If discussed at [place] of Stay Meetings, dates discussed:   06/26/2014    Comments:  Contact:  Lorenson,Rita Spouse [phone]   06/25/2014 @ 11:00  Whitman Hero RN,BSN,CM [phone] Pt  is homeless, per CSW  pt states has relationship with [facility].  CSW will call [facility] regarding assist with medications. Medicare should go into effect on 07/04/2014 per pt. Will continue to monitor for discharge needs.  06-21-14 10:35am Luz Lex, RNBSN [phone] Notes say patient homeless. - showing he has a wife?? Talked with patient.  He has been married for 37 years. Both he and his wife have been living in a hotel, but today they were to move into a house.  Have a county SW assisting them.  Wife should be in house today.  Will notify SW to follow.  CM will continue to follow also for any dc needs.

## 2014-06-21 NOTE — Progress Notes (Signed)
eLink Physician-Brief Progress Note Patient Name: Dustin Freeman DOB: 09-02-1955 MRN: [id number]   Date of Service  06/21/2014  HPI/Events of Note  Review of CXR reveals Continued slight worsening of right basilar pneumonia, possibly postobstructive in etiology based on prior studies. Now sat = 96%, RR = 25, HR = 74 and BP = 99/54.  eICU Interventions  Continue current management.      Intervention Category Intermediate Interventions: Respiratory distress - evaluation and management  Sommer,Beckem Eugene 06/21/2014, 8:46 PM

## 2014-06-21 NOTE — H&P (Signed)
PULMONARY / CRITICAL CARE MEDICINE   Name: Dustin Freeman MRN: [id number] DOB: 1955/08/17    ADMISSION DATE:  06/20/2014 CONSULTATION DATE:  06/21/2014  REFERRING MD :  EDP  CHIEF COMPLAINT:  Dizziness, cough, diarrhea  INITIAL PRESENTATION:  59 y/o M w/ PMHx of DM type II and PVD s/p stent placement of right external iliac artery, brought to [facility] ED 3/2 for dizziness, cough, diarrhea. Hospitalized 3 weeks ago for syncope and underwent stent placement in occluded right external iliac artery at that time.  Ever since being discharged, has had diarrhea.  In ED, CXR c/w RLL consolidation and patient remained hypotensive despite 4L IVF.  PCCM consulted for admission.  STUDIES:  CXR 3/2 >>> RLL consolidation  SIGNIFICANT EVENTS: 1/24 - 1/27 - admitted for syncope.  Found to have PVD with RLE claudication due to occluded right external iliac artery.  Underwent stent placement 3/2 - admit with HCAP  SUBJECTIVE: Feeling better this AM, still w/ dizziness. Hungry.   VITAL SIGNS: Temp:  [98.2 F (36.8 C)-100.1 F (37.8 C)] 99.1 F (37.3 C) (03/03 0409) Pulse Rate:  [43-112] 66 (03/03 0630) Resp:  [15-29] 21 (03/03 0630) BP: (74-140)/(35-87) 89/47 mmHg (03/03 0630) SpO2:  [92 %-100 %] 95 % (03/03 0630) Weight:  [116 lb 13.5 oz (53 kg)-122 lb 12.7 oz (55.7 kg)] 122 lb 12.7 oz (55.7 kg) (03/03 0300)   HEMODYNAMICS:   VENTILATOR SETTINGS:   INTAKE / OUTPUT: Intake/Output      03/02 0701 - 03/03 0700   I.V. (mL/kg) 973.3 (17.5)   IV Piggyback 1450   Total Intake(mL/kg) 2423.3 (43.5)   Urine (mL/kg/hr) 600   Stool 525   Total Output 1125   Net +1298.3         PHYSICAL EXAMINATION: General: Frail adult male, in NAD. Intermittent cough.  Neuro: Awake A&O x 3, No focal neuro deficits.  HEENT: PERRL, EOMI, sclerae anicteric. Cardiovascular: RRR, no M/R/G.  Lungs: Respirations even and unlabored.  Coarse crackles RLL Abdomen: BS x 4, soft, NT/ND.  Musculoskeletal: No gross  deformities, no edema.  Skin: Intact, warm, no rashes.  LABS:  CBC  Recent Labs Lab 06/20/14 1337 06/21/14 0235  WBC 14.1* 12.5*  HGB 10.1* 9.6*  HCT 30.4* 28.5*  PLT 417* 394   BMET  Recent Labs Lab 06/20/14 1337 06/21/14 0235  NA 129* 129*  K 4.6 3.9  CL 102 108  CO2 21 17*  BUN 9 6  CREATININE 0.63 0.54  GLUCOSE 106* 87   Electrolytes  Recent Labs Lab 06/20/14 1337 06/21/14 0235  CALCIUM 6.8* 6.6*  MG  --  1.8  PHOS  --  2.6   Sepsis Markers  Recent Labs Lab 06/20/14 1443 06/20/14 1600  LATICACIDVEN 2.07* 2.2*    Liver Enzymes  Recent Labs Lab 06/20/14 1337  AST 30  ALT 28  ALKPHOS 75  BILITOT 0.9  ALBUMIN 1.6*    Cardiac Enzymes  Recent Labs Lab 06/20/14 1600  TROPONINI <0.03   Glucose  Recent Labs Lab 06/20/14 1730 06/20/14 1924 06/21/14 0041 06/21/14 0412  GLUCAP 93 95 91 87    Imaging Dg Abd Acute W/chest  06/20/2014   CLINICAL DATA:  Right upper quadrant pain, cough for weeks.  EXAM: ACUTE ABDOMEN SERIES (ABDOMEN 2 VIEW & CHEST 1 VIEW)  COMPARISON:  05/13/2014  FINDINGS: New airspace opacity noted in the right lower lobe concerning for pneumonia. Underlying COPD. Heart is normal size. Left lung is clear. No effusions.  Mild  diffuse gaseous distention of the colon. No evidence of bowel obstruction. No organomegaly or suspicious calcification.  IMPRESSION: Right lower lobe consolidation compatible with pneumonia.  COPD.   Electronically Signed   By: Rolm Baptise M.D.   On: 06/20/2014 13:11    ASSESSMENT / PLAN:  PULMONARY A: HCAP rt base, aspiration? COPD by CXR - has never had PFT's Tobacco use disorder P:   No supplemental O2 needed at this time. Pulmonary hygiene. Continue tobacco cessation efforts. Abx as below slp needed  CARDIOVASCULAR A:  Shock- hypovolemic (diarrhea, poor po intake) + early sepsis 2/2 HCAP PVD s/p stent placement to right external iliac artery 05/15/14 (Dr. Donnetta Hutching) P:  IVF; NS @ 100  cc/hr May need CVL Continue Plavix Cortisol / tsh Lactic flat, some concern about hypoperfusion exists, repeat further lactic Map goal 55-60 with urine output greater 25 cc/hr Pos balance, bolus further  RENAL A:   Hyponatremia Pseudohypocalcemia Mild Non-AG Acidosis; likely 2/2 volume repletion  P:   Saline maintenance lactic repeat bolus  GASTROINTESTINAL A:   Protein calorie malnutrition Nutrition malnurished Diarrhea resolved? at risk mesenteric ischemia, has no abdo pain P:   Nutrition consult Carb mod diet Prot suppliments C. Diff pending lsh  HEMATOLOGIC A:   Anemia; chronic disease vs dilutional Leukocytosis; improving VTE Prophylaxis P:  Transfuse for Hgb < 7. SCD's / Heparin [place] CBC in AM  INFECTIOUS A:   HCAP; RLL infiltrate on CXR R/o C. Diff P:   BCx2 3/2 > UCx 3/2 > Sputum Cx 3/2 > C. Diff PCR 3/2 > Abx: Vanc, start date 3/2 >>> Abx: Levaquin, start date 3/2 >>>3/3  Cefepime 3/3>>>  Back to true nosocomial exposure  ENDOCRINE A:   DM Type II P:   SSI  Hold Metformin  NEUROLOGIC A:   No acute issues P:   No interventions required Hold Lyrica   Natasha Bence, MD PGY-2, Internal Medicine Pager: [phone]  STAFF NOTE: Linwood Dibbles, MD FACP have personally reviewed patient's available data, including medical history, events of note, physical examination and test results as part of my evaluation. I have discussed with resident/NP and other care providers such as pharmacist, RN and RRT. In addition, I personally evaluated patient and elicited key findings of: MAP 55-60 met with good urine output, lactic noted, will repeat, send ldh, lactic, lip, amy, no further diarrhea noted, bolus, cortisol, to sdu  To triad sdu   Lavon Paganini. Titus Mould, MD, [place] Pgr: [facility] 06/21/2014 11:58 AM

## 2014-06-21 NOTE — Progress Notes (Signed)
INITIAL NUTRITION ASSESSMENT  DOCUMENTATION CODES Per approved criteria  -Severe malnutrition in the context of chronic illness -Underweight   Pt meets criteria for severe malnutrition in the context of chronic illness as evidenced by < 75% energy intake for > 1 month, and severe muscle mass and body fat depletion.  INTERVENTION: Provide Glucerna Shake TID, each provides 220 kcal, 10 grams of protein, 200 ml of H20  Provide MVI  Calorie count, RD to follow up with day 1 results on 3/4.  NUTRITION DIAGNOSIS: Inadequate oral intake related to poor appetite as evidenced by decreased PO intake.   Goal: Pt to meet >/= 90% of estimated energy needs   Monitor:  PO intake, calorie count, weight status, labs  Reason for Assessment: Consult for assessment of nutrition requirements/status; calorie count  59 y.o. male  Admitting Dx: <principal problem not specified>  ASSESSMENT: 59 y/o male with past medical history of DMII and PVD s/p stent placement of right external iliac artery. Presented to [facility] ED with dizziness, cough, diarrhea. Hospitalized 3 weeks ago for syncope and underwent stent placement in occluded right external iliac artery at this time. Ever since discharge, has had diarrhea.   Labs- low Ca, K Pt confirmed wt loss and decreased appetite pta. He reported feeling nauseous since stent placement (3 weeks ago), and as a result has not been eating much. Pt knew his low weight was an issue, and was eager to start eating again. Discussed managing blood glucose through CHO counting, and eating small, frequent meals throughout the day.  Pt reported feeling better, and his PO intake is 90%. Encouraged PO intake. Will start calorie count tomorrow. Pt was interested in trying Glucerna shakes, and MVI.  Nutrition Focused Physical Exam:  Subcutaneous Fat:  Orbital Region: severe depletion Upper Arm Region: severe depletion Thoracic and Lumbar Region: severe depletion  Muscle:   Temple Region: severe depletion Clavicle Bone Region: severe depletion Clavicle and Acromion Bone Region: severe depletion Scapular Bone Region: severe depletion Dorsal Hand: severe depletion Patellar Region: severe depletion Anterior Thigh Region: severe depletion Posterior Calf Region: severe depletion  Edema: not present  Height: Ht Readings from Last 1 Encounters:  06/20/14 5\' 9"  (1.753 m)    Weight: Wt Readings from Last 1 Encounters:  06/21/14 122 lb 12.7 oz (55.7 kg)    Ideal Body Weight: 160 lb (72.7 kg)  % Ideal Body Weight: 76%  Wt Readings from Last 10 Encounters:  06/21/14 122 lb 12.7 oz (55.7 kg)  05/16/14 116 lb 14.1 oz (53.016 kg)    Usual Body Weight: 135 lb (61.4 kg)  % Usual Body Weight: 90%  BMI:  Body mass index is 18.13 kg/(m^2). underweight  Estimated Nutritional Needs: Kcal: 1750-1950 kcal Protein: 80-95 grams Fluid: >/= 1.5L daily  Skin: intact  Diet Order: Diet Carb Modified  EDUCATION NEEDS: Discussed the importance of eating three meals daily with snacks in between to maintain normal blood glucose   Intake/Output Summary (Last 24 hours) at 06/21/14 0918 Last data filed at 06/21/14 0800  Gross per 24 hour  Intake 2823.33 ml  Output   1675 ml  Net 1148.33 ml    Last BM: Experiencing diarrhea pta, but has not had BM since admission  Labs:   Recent Labs Lab 06/20/14 1337 06/21/14 0235  NA 129* 129*  K 4.6 3.9  CL 102 108  CO2 21 17*  BUN 9 6  CREATININE 0.63 0.54  CALCIUM 6.8* 6.6*  MG  --  1.8  PHOS  --  2.6  GLUCOSE 106* 87    CBG (last 3)   Recent Labs  06/21/14 0041 06/21/14 0412 06/21/14 0814  GLUCAP 91 87 93    Scheduled Meds: . antiseptic oral rinse  7 mL Mouth Rinse q12n4p  . chlorhexidine  15 mL Mouth Rinse BID  . clopidogrel  75 mg Oral Daily  . heparin  5,000 Units Subcutaneous 3 times per day  . insulin aspart  0-9 Units Subcutaneous TID WC  . levofloxacin (LEVAQUIN) IV  750 mg  Intravenous QHS  . vancomycin  500 mg Intravenous Q8H    Continuous Infusions: . sodium chloride 100 mL/hr at 06/21/14 4709    Past Medical History  Diagnosis Date  . History of palpitations     During [employer] intake exam, not since  . DM (diabetes mellitus)   . PVD (peripheral vascular disease)     s/p stent to right external iliac artery (05/15/14 - Dr. Donnetta Hutching)    Past Surgical History  Procedure Laterality Date  . Lower extremity angiogram N/A 05/15/2014    Procedure: LOWER EXTREMITY ANGIOGRAM;  Surgeon: Serafina Mitchell, MD;  Location: [facility] CATH LAB;  Service: Cardiovascular;  Laterality: N/A;    Wynona Dove, MS Dietetic Intern Pager: [phone]  I agree with student dietitian note; appropriate revisions have been made.  Molli Barrows, RD, LDN, [place] Pager# [phone] After Hours Pager# [phone]

## 2014-06-22 ENCOUNTER — Inpatient Hospital Stay (HOSPITAL_COMMUNITY): Payer: Medicaid Other

## 2014-06-22 ENCOUNTER — Encounter (HOSPITAL_COMMUNITY): Payer: Self-pay

## 2014-06-22 DIAGNOSIS — I950 Idiopathic hypotension: Secondary | ICD-10-CM

## 2014-06-22 DIAGNOSIS — R222 Localized swelling, mass and lump, trunk: Secondary | ICD-10-CM

## 2014-06-22 DIAGNOSIS — E871 Hypo-osmolality and hyponatremia: Secondary | ICD-10-CM

## 2014-06-22 LAB — BASIC METABOLIC PANEL
ANION GAP: 5 (ref 5–15)
BUN: 6 mg/dL (ref 6–23)
CALCIUM: 6.8 mg/dL — AB (ref 8.4–10.5)
CO2: 20 mmol/L (ref 19–32)
CREATININE: 0.57 mg/dL (ref 0.50–1.35)
Chloride: 102 mmol/L (ref 96–112)
GFR calc non Af Amer: >90 mL/min (ref >90)
Glucose, Bld: 107 mg/dL — ABNORMAL HIGH (ref 70–99)
Potassium: 3.7 mmol/L (ref 3.5–5.1)
SODIUM: 127 mmol/L — AB (ref 135–145)

## 2014-06-22 LAB — CBC WITH DIFFERENTIAL/PLATELET
BASOS PCT: 0 % (ref 0–1)
Basophils Absolute: 0 10*3/uL (ref 0.0–0.1)
EOS ABS: 0 10*3/uL (ref 0.0–0.7)
Eosinophils Relative: 0 % (ref 0–5)
HCT: 27.8 % — ABNORMAL LOW (ref 39.0–52.0)
HEMOGLOBIN: 9.4 g/dL — AB (ref 13.0–17.0)
LYMPHS PCT: 11 % — AB (ref 12–46)
Lymphs Abs: 1.4 10*3/uL (ref 0.7–4.0)
MCH: 29.7 pg (ref 26.0–34.0)
MCHC: 33.8 g/dL (ref 30.0–36.0)
MCV: 87.7 fL (ref 78.0–100.0)
Monocytes Absolute: 0.7 10*3/uL (ref 0.1–1.0)
Monocytes Relative: 6 % (ref 3–12)
NEUTROS ABS: 10.8 10*3/uL — AB (ref 1.7–7.7)
Neutrophils Relative %: 83 % — ABNORMAL HIGH (ref 43–77)
Platelets: 380 10*3/uL (ref 150–400)
RBC: 3.17 MIL/uL — AB (ref 4.22–5.81)
RDW: 14.3 % (ref 11.5–15.5)
WBC: 13 10*3/uL — ABNORMAL HIGH (ref 4.0–10.5)

## 2014-06-22 LAB — CORTISOL: CORTISOL PLASMA: 24.4 ug/dL

## 2014-06-22 LAB — VANCOMYCIN, TROUGH: VANCOMYCIN TR: 7.4 ug/mL — AB (ref 10.0–20.0)

## 2014-06-22 LAB — GLUCOSE, CAPILLARY
Glucose-Capillary: 107 mg/dL — ABNORMAL HIGH (ref 70–99)
Glucose-Capillary: 108 mg/dL — ABNORMAL HIGH (ref 70–99)
Glucose-Capillary: 112 mg/dL — ABNORMAL HIGH (ref 70–99)
Glucose-Capillary: 106 mg/dL — ABNORMAL HIGH (ref 70–99)

## 2014-06-22 LAB — LACTIC ACID, PLASMA: Lactic Acid, Venous: 1.6 mmol/L (ref 0.5–2.0)

## 2014-06-22 LAB — MAGNESIUM: Magnesium: 2 mg/dL (ref 1.5–2.5)

## 2014-06-22 MED ORDER — VANCOMYCIN HCL IN DEXTROSE 750-5 MG/150ML-% IV SOLN
750.0000 mg | Freq: Three times a day (TID) | INTRAVENOUS | Status: DC
  Administered 2014-06-22 – 2014-06-23 (×2): 750 mg via INTRAVENOUS
  Filled 2014-06-22 (×4): qty 150

## 2014-06-22 MED ORDER — IPRATROPIUM-ALBUTEROL 0.5-2.5 (3) MG/3ML IN SOLN
3.0000 mL | Freq: Four times a day (QID) | RESPIRATORY_TRACT | Status: DC
  Administered 2014-06-22 – 2014-06-30 (×26): 3 mL via RESPIRATORY_TRACT
  Filled 2014-06-22 (×27): qty 3

## 2014-06-22 MED ORDER — OXYCODONE HCL 5 MG PO TABS
5.0000 mg | ORAL_TABLET | Freq: Four times a day (QID) | ORAL | Status: DC | PRN
  Administered 2014-06-22 – 2014-07-10 (×6): 5 mg via ORAL
  Filled 2014-06-22 (×6): qty 1

## 2014-06-22 MED ORDER — ACETAMINOPHEN-CODEINE #3 300-30 MG PO TABS
1.0000 | ORAL_TABLET | Freq: Four times a day (QID) | ORAL | Status: DC | PRN
  Administered 2014-06-23 – 2014-06-26 (×2): 2 via ORAL
  Filled 2014-06-22 (×2): qty 2

## 2014-06-22 MED ORDER — HYDROCOD POLST-CHLORPHEN POLST 10-8 MG/5ML PO LQCR
5.0000 mL | Freq: Two times a day (BID) | ORAL | Status: DC | PRN
  Administered 2014-06-23 – 2014-06-26 (×2): 5 mL via ORAL
  Filled 2014-06-22 (×2): qty 5

## 2014-06-22 MED ORDER — IOHEXOL 300 MG/ML  SOLN
80.0000 mL | Freq: Once | INTRAMUSCULAR | Status: AC | PRN
  Administered 2014-06-22: 80 mL via INTRAVENOUS

## 2014-06-22 MED ORDER — ACETAMINOPHEN 325 MG PO TABS
650.0000 mg | ORAL_TABLET | Freq: Four times a day (QID) | ORAL | Status: DC | PRN
  Administered 2014-06-26 – 2014-06-28 (×2): 650 mg via ORAL
  Filled 2014-06-22 (×2): qty 2

## 2014-06-22 MED ORDER — OXYCODONE HCL 5 MG PO TABS: 5.0000 mg | ORAL_TABLET | Freq: Four times a day (QID) | ORAL | Status: DC | PRN

## 2014-06-22 MED ORDER — PIPERACILLIN-TAZOBACTAM 3.375 G IVPB
3.3750 g | Freq: Three times a day (TID) | INTRAVENOUS | Status: AC
  Administered 2014-06-22 – 2014-07-05 (×41): 3.375 g via INTRAVENOUS
  Filled 2014-06-22 (×42): qty 50

## 2014-06-22 NOTE — Progress Notes (Signed)
NUTRITION FOLLOW-UP  DOCUMENTATION CODES Per approved criteria  -Severe malnutrition in the context of chronic illness -Underweight   Pt meets criteria for severe malnutrition in the context of chronic illness as evidenced by < 75% energy intake for > 1 month, and severe muscle mass and body fat depletion.  INTERVENTION: Provide Glucerna Shake TID, each provides 220 kcal, 10 grams of protein, 200 ml of H20  Provide MVI  D/C Calorie count  NUTRITION DIAGNOSIS: Inadequate oral intake related to poor appetite as evidenced by decreased PO intake; ongoing  Goal: Pt to meet >/= 90% of estimated energy needs; ongoing   Monitor:  PO intake, weight status, labs  ASSESSMENT: 59 y/o male with past medical history of DMII and PVD s/p stent placement of right external iliac artery. Presented to [facility] ED with dizziness, cough, diarrhea. Hospitalized 3 weeks ago for syncope and underwent stent placement in occluded right external iliac artery at this time. Ever since discharge, has had diarrhea.   Labs- low Ca, K Per nurse, pt is eating 75% meals at this time. Pt is tolerating Glucerna shakes and drinking 100%. He has not experienced any diarrhea since admission. Appetite is improving.  Suspect intake will be adequate with current intake of meals and additional supplements. D/C calorie count.   Height: Ht Readings from Last 1 Encounters:  06/20/14 5\' 9"  (1.753 m)    Weight: Wt Readings from Last 1 Encounters:  06/22/14 130 lb 8.2 oz (59.2 kg)    BMI:  Body mass index is 19.26 kg/(m^2).   Estimated Nutritional Needs: Kcal: 1750-1950 kcal Protein: 80-95 grams Fluid: >/= 1.5L daily  Skin: intact  Diet Order: Diet Carb Modified   Intake/Output Summary (Last 24 hours) at 06/22/14 1106 Last data filed at 06/22/14 0900  Gross per 24 hour  Intake   3460 ml  Output   1175 ml  Net   2285 ml    Last BM: pta  Labs:   Recent Labs Lab 06/20/14 1337 06/21/14 0235 06/22/14 0312   NA 129* 129* 127*  K 4.6 3.9 3.7  CL 102 108 102  CO2 21 17* 20  BUN 9 6 6   CREATININE 0.63 0.54 0.57  CALCIUM 6.8* 6.6* 6.8*  MG  --  1.8 2.0  PHOS  --  2.6  --   GLUCOSE 106* 87 107*    CBG (last 3)   Recent Labs  06/21/14 1157 06/21/14 1554 06/22/14 0731  GLUCAP 112* 151* 107*    Scheduled Meds: . antiseptic oral rinse  7 mL Mouth Rinse q12n4p  . chlorhexidine  15 mL Mouth Rinse BID  . clopidogrel  75 mg Oral Daily  . feeding supplement (GLUCERNA SHAKE)  237 mL Oral TID BM  . heparin  5,000 Units Subcutaneous 3 times per day  . insulin aspart  0-9 Units Subcutaneous TID WC  . ipratropium-albuterol  3 mL Nebulization QID  . multivitamin with minerals  1 tablet Oral Daily  . piperacillin-tazobactam (ZOSYN)  IV  3.375 g Intravenous Q8H  . vancomycin  500 mg Intravenous Q8H    Continuous Infusions: . sodium chloride 125 mL/hr at 06/22/14 0900     Wynona Dove, MS Dietetic Intern Pager: [phone]  I agree with student dietitian note; appropriate revisions have been made.  Molli Barrows, RD, LDN, [place] Pager# [phone] After Hours Pager# [phone]

## 2014-06-22 NOTE — Progress Notes (Signed)
ANTIBIOTIC CONSULT NOTE - INITIAL  Pharmacy Consult for vancomycin Indication: HCAP  No Known Allergies  Patient Measurements: Height: 5\' 9"  (175.3 cm) Weight: 134 lb 0.6 oz (60.8 kg) IBW/kg (Calculated) : 70.7  Weight: 53 kg on 05/16/14  Vital Signs: Temp: 98.3 F (36.8 C) (03/04 1603) Temp Source: Oral (03/04 1603) BP: 122/65 mmHg (03/04 1600) Pulse Rate: 89 (03/04 1600) Intake/Output from previous day: 03/03 0701 - 03/04 0700 In: 3980 [P.O.:1080; I.V.:2400; IV Piggyback:500] Out: 1725 [Urine:1725] Intake/Output from this shift: Total I/O In: 1705 [P.O.:480; I.V.:1075; IV Piggyback:150] Out: 650 [Urine:650]  Labs:  Recent Labs  06/20/14 1337 06/21/14 0235 06/22/14 0312  WBC 14.1* 12.5* 13.0*  HGB 10.1* 9.6* 9.4*  PLT 417* 394 380  CREATININE 0.63 0.54 0.57   Estimated Creatinine Clearance: 84.4 mL/min (by C-G formula based on Cr of 0.57).  Recent Labs  06/22/14 1514  VANCOTROUGH 7.4*     Microbiology: Recent Results (from the past 720 hour(s))  Blood Culture (routine x 2)     Status: None (Preliminary result)   Collection Time: 06/20/14  1:49 PM  Result Value Ref Range Status   Specimen Description BLOOD ARM RIGHT  Final   Special Requests BOTTLES DRAWN AEROBIC AND ANAEROBIC 5CC  Final   Culture   Final           BLOOD CULTURE RECEIVED NO GROWTH TO DATE CULTURE WILL BE HELD FOR 5 DAYS BEFORE ISSUING A FINAL NEGATIVE REPORT Performed at [facility]    Report Status PENDING  Incomplete  Urine culture     Status: None   Collection Time: 06/20/14  2:12 PM  Result Value Ref Range Status   Specimen Description URINE, RANDOM  Final   Special Requests NONE  Final   Colony Count NO GROWTH Performed at [facility]   Final   Culture NO GROWTH Performed at [facility]   Final   Report Status 06/21/2014 FINAL  Final  Blood Culture (routine x 2)     Status: None (Preliminary result)   Collection Time: 06/20/14  2:49 PM  Result  Value Ref Range Status   Specimen Description BLOOD RIGHT FOREARM  Final   Special Requests BOTTLES DRAWN AEROBIC AND ANAEROBIC 4CC  Final   Culture   Final           BLOOD CULTURE RECEIVED NO GROWTH TO DATE CULTURE WILL BE HELD FOR 5 DAYS BEFORE ISSUING A FINAL NEGATIVE REPORT Performed at [facility]    Report Status PENDING  Incomplete  MRSA PCR Screening     Status: None   Collection Time: 06/20/14  5:20 PM  Result Value Ref Range Status   MRSA by PCR NEGATIVE NEGATIVE Final    Comment:        The GeneXpert MRSA Assay (FDA approved for NASAL specimens only), is one component of a comprehensive MRSA colonization surveillance program. It is not intended to diagnose MRSA infection nor to guide or monitor treatment for MRSA infections.   Culture, expectorated sputum-assessment     Status: None   Collection Time: 06/21/14  3:39 PM  Result Value Ref Range Status   Specimen Description SPUTUM  Final   Special Requests NONE  Final   Sputum evaluation   Final    MICROSCOPIC FINDINGS SUGGEST THAT THIS SPECIMEN IS NOT REPRESENTATIVE OF LOWER RESPIRATORY SECRETIONS. PLEASE RECOLLECT. Avis Epley RN [phone] 06/21/14 A BROWNING    Report Status 06/21/2014 FINAL  Final  Medical History: Past Medical History  Diagnosis Date  . History of palpitations     During [employer] intake exam, not since  . DM (diabetes mellitus)   . PVD (peripheral vascular disease)     s/p stent to right external iliac artery (05/15/14 - Dr. Donnetta Hutching)    Medications:  Plavix, metformin, lyrica  Assessment: 59 yo M reported to be homeless, hypotensive and cold when found.  Pt has been abx for the past few days for PNA. Cultures are neg to date. WBC ~13. Tm 101.3. Vanc trough was drawn this PM and came back low. Going to adjust dose.   Goal of Therapy:  Vancomycin trough level 15-20 mcg/ml  Plan:   Change vanc to 750mg  IV q8 Going to get another trough if needed  Onnie Boer, PharmD Pager:  [phone] 06/22/2014 4:54 PM

## 2014-06-22 NOTE — Progress Notes (Addendum)
TEAM 1 - Stepdown/ICU TEAM Progress Note  DIMARCO MINKIN DJS:[id number] DOB: 07-01-1955 DOA: 06/20/2014 PCP: No PCP Per Patient  Admit HPI / Brief Narrative: 59 y/o M w/ Hx of DM type II and PVD s/p stent placement of right external iliac artery, brought to [facility] ED 3/2 for dizziness, cough, diarrhea. Hospitalized 3 weeks prior for syncope and underwent stent placement in occluded right external iliac artery at that time. Ever since being discharged, has had diarrhea. In ED, CXR c/w RLL consolidation and patient remained hypotensive despite 4L IVF. PCCM consulted for admission.  Significant Events: 1/24 - 1/27 - admitted for syncope - found to have PVD with RLE claudication due to occluded right external iliac artery - underwent stent placement 3/2 - admit with HCAP  HPI/Subjective: Pt c/o pleuritic type pain in the RUQ and RLL area.  He is expectorating signif amounts of thick sputum.  He denies cp, n/v, or abdom pain.  His diarrhea appears to have resolved.    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 Donnetta Hutching) Foot is warm w/ pulse - pt denies complaints - cont Plavix   Severe malnutrition in the context of chronic  illness Appears to be related to limited access to food / homelessness - eating well   Code Status: FULL Family Communication: no family present at time of exam Disposition Plan: SDU  Consultants: PCCM > TRH  Procedures: none  Antibiotics: Cefepime 3/2 > 3/4 Levaquin 3/2 Vanc 3/2 > Zosyn 3/4 >  DVT prophylaxis: SQ heparin   Objective: Blood pressure 94/50, pulse 75, temperature 98.7 F (37.1 C), temperature source Oral, resp. rate 23, height 5\' 9"  (1.753 m), weight 59.2 kg (130 lb 8.2 oz), SpO2 94 %.  Intake/Output Summary (Last 24 hours) at 06/22/14 0825 Last data filed at 06/22/14 0700  Gross per 24 hour  Intake   3580 ml  Output   1175 ml  Net   2405 ml    Exam: General: No acute respiratory distress Lungs: R basilar crackles - no wheeze  Cardiovascular: Regular rate and rhythm without murmur gallop or rub normal S1 and S2 Abdomen: Nontender, nondistended, soft, bowel sounds positive, no rebound, no ascites, no appreciable mass Extremities: No significant cyanosis, clubbing, or edema bilateral lower extremities Vasc:  2+ DP pulses B LE - warm to touch   Data Reviewed: Basic Metabolic Panel:  Recent Labs Lab 06/20/14 1337 06/21/14 0235 06/22/14 0312  NA 129* 129* 127*  K 4.6 3.9 3.7  CL 102 108 102  CO2 21 17* 20  GLUCOSE 106* 87 107*  BUN 9 6 6   CREATININE 0.63 0.54 0.57  CALCIUM 6.8* 6.6* 6.8*  MG  --  1.8 2.0  PHOS  --  2.6  --     Liver Function Tests:  Recent Labs Lab 06/20/14 1337  AST 30  ALT 28  ALKPHOS 75  BILITOT 0.9  PROT 5.0*  ALBUMIN 1.6*    Recent Labs Lab 06/20/14 1337 06/21/14 1400  LIPASE 29 21  AMYLASE  --  50   CBC:  Recent Labs Lab 06/20/14 1337 06/21/14 0235 06/22/14 0312  WBC 14.1* 12.5* 13.0*  NEUTROABS 12.3*  --  10.8*  HGB 10.1* 9.6* 9.4*  HCT 30.4* 28.5* 27.8*  MCV 87.1 87.4 87.7  PLT 417* 394 380   Cardiac Enzymes:  Recent Labs Lab 06/20/14 1600  TROPONINI <0.03   CBG:  Recent  Labs Lab 06/21/14 0412 06/21/14 0814 06/21/14 1157 06/21/14 1554 06/22/14 0731  GLUCAP 87 93 112* 151* 107*    Recent Results (from the past 240 hour(s))  Blood Culture (routine x 2)     Status: None (Preliminary result)   Collection Time: 06/20/14  1:49 PM  Result Value Ref Range Status   Specimen Description BLOOD ARM RIGHT  Final   Special Requests BOTTLES DRAWN AEROBIC AND ANAEROBIC 5CC  Final   Culture   Final           BLOOD CULTURE RECEIVED NO GROWTH TO DATE CULTURE WILL BE HELD FOR 5 DAYS BEFORE ISSUING A FINAL NEGATIVE REPORT Performed at [facility]    Report Status PENDING  Incomplete  Urine culture     Status: None   Collection Time: 06/20/14  2:12 PM  Result Value Ref Range Status   Specimen Description URINE, RANDOM  Final   Special Requests NONE  Final   Colony Count NO GROWTH Performed at [facility]   Final   Culture NO GROWTH Performed at [facility]   Final   Report Status 06/21/2014 FINAL  Final  Blood Culture (routine x 2)     Status: None (Preliminary result)   Collection Time: 06/20/14  2:49 PM  Result Value Ref Range Status   Specimen Description BLOOD RIGHT FOREARM  Final   Special Requests BOTTLES DRAWN AEROBIC AND ANAEROBIC 4CC  Final   Culture   Final           BLOOD CULTURE RECEIVED NO GROWTH TO DATE CULTURE WILL BE HELD FOR 5 DAYS BEFORE ISSUING A FINAL NEGATIVE REPORT Performed at [facility]    Report Status PENDING  Incomplete  MRSA PCR Screening     Status: None   Collection Time: 06/20/14  5:20 PM  Result Value Ref Range Status   MRSA by PCR NEGATIVE NEGATIVE Final    Comment:        The GeneXpert MRSA Assay (FDA approved for NASAL specimens only), is one component of a comprehensive MRSA colonization surveillance program. It is not intended to diagnose MRSA infection nor to guide or monitor treatment for MRSA infections.   Culture, expectorated sputum-assessment     Status: None    Collection Time: 06/21/14  3:39 PM  Result Value Ref Range Status   Specimen Description SPUTUM  Final   Special Requests NONE  Final   Sputum evaluation   Final    MICROSCOPIC FINDINGS SUGGEST THAT THIS SPECIMEN IS NOT REPRESENTATIVE OF LOWER RESPIRATORY SECRETIONS. PLEASE RECOLLECT. Avis Epley RN [phone] 06/21/14 A BROWNING    Report Status 06/21/2014 FINAL  Final     Studies:  Recent x-ray studies have been  reviewed in detail by the Attending Physician  Scheduled Meds:  Scheduled Meds: . antiseptic oral rinse  7 mL Mouth Rinse q12n4p  . ceFEPime (MAXIPIME) IV  1 g Intravenous 3 times per day  . chlorhexidine  15 mL Mouth Rinse BID  . clopidogrel  75 mg Oral Daily  . feeding supplement (GLUCERNA SHAKE)  237 mL Oral TID BM  . heparin  5,000 Units Subcutaneous 3 times per day  . insulin aspart  0-9 Units Subcutaneous TID WC  . ipratropium-albuterol  3 mL Nebulization Q4H  . multivitamin with minerals  1 tablet Oral Daily  . vancomycin  500 mg Intravenous Q8H    Time spent on care of this patient: 35 mins   Janeann Paisley T , MD   [facility] Office  [phone] Pager - Text Page per Shea Evans as per below:  On-Call/Text Page:      [email or web address]      password [email or web address]  If 7PM-7AM, please contact night-coverage [email or web address] Password [email or web address] 06/22/2014, 8:25 AM   LOS: 2 days

## 2014-06-22 NOTE — Progress Notes (Signed)
Report called to receiving RN, Lynn Ito.  Patient to be transferred from [place] room 10 to 3S room 3.  All questions answered.  Patient's chart, meds, and personal room belongings sent with patient.  Patient to be transferred via wheelchair on tele monitor and 4 L [place] to new room location by myself.  Safety measures maintained.  Doran Clay, RN

## 2014-06-22 NOTE — Progress Notes (Signed)
Patient admitted to room 3S04 , alert and oriented ,BP,103/50, HR 82. Sat 99% on 3l. R-20 no complaints voiced at this time oriented to room and call bell within reach

## 2014-06-23 DIAGNOSIS — R918 Other nonspecific abnormal finding of lung field: Secondary | ICD-10-CM

## 2014-06-23 DIAGNOSIS — J90 Pleural effusion, not elsewhere classified: Secondary | ICD-10-CM | POA: Diagnosis present

## 2014-06-23 DIAGNOSIS — J984 Other disorders of lung: Secondary | ICD-10-CM

## 2014-06-23 LAB — CBC
HCT: 26.3 % — ABNORMAL LOW (ref 39.0–52.0)
HEMOGLOBIN: 9.2 g/dL — AB (ref 13.0–17.0)
MCH: 29.6 pg (ref 26.0–34.0)
MCHC: 35 g/dL (ref 30.0–36.0)
MCV: 84.6 fL (ref 78.0–100.0)
PLATELETS: 368 10*3/uL (ref 150–400)
RBC: 3.11 MIL/uL — ABNORMAL LOW (ref 4.22–5.81)
RDW: 14.2 % (ref 11.5–15.5)
WBC: 12.5 10*3/uL — ABNORMAL HIGH (ref 4.0–10.5)

## 2014-06-23 LAB — COMPREHENSIVE METABOLIC PANEL
ALT: 21 U/L (ref 0–53)
ANION GAP: 4 — AB (ref 5–15)
AST: 28 U/L (ref 0–37)
Albumin: 1.2 g/dL — ABNORMAL LOW (ref 3.5–5.2)
Alkaline Phosphatase: 52 U/L (ref 39–117)
BUN: <5 mg/dL — ABNORMAL LOW (ref 6–23)
CO2: 23 mmol/L (ref 19–32)
Calcium: 6.7 mg/dL — ABNORMAL LOW (ref 8.4–10.5)
Chloride: 100 mmol/L (ref 96–112)
Creatinine, Ser: 0.58 mg/dL (ref 0.50–1.35)
GFR calc Af Amer: >90 mL/min (ref >90)
GFR calc non Af Amer: >90 mL/min (ref >90)
GLUCOSE: 93 mg/dL (ref 70–99)
POTASSIUM: 3.3 mmol/L — AB (ref 3.5–5.1)
Sodium: 127 mmol/L — ABNORMAL LOW (ref 135–145)
Total Bilirubin: 0.6 mg/dL (ref 0.3–1.2)
Total Protein: 4 g/dL — ABNORMAL LOW (ref 6.0–8.3)

## 2014-06-23 LAB — GLUCOSE, CAPILLARY
GLUCOSE-CAPILLARY: 95 mg/dL (ref 70–99)
GLUCOSE-CAPILLARY: 117 mg/dL — AB (ref 70–99)
Glucose-Capillary: 106 mg/dL — ABNORMAL HIGH (ref 70–99)
Glucose-Capillary: 112 mg/dL — ABNORMAL HIGH (ref 70–99)

## 2014-06-23 MED ORDER — SODIUM CHLORIDE 0.9 % IV BOLUS (SEPSIS)
500.0000 mL | Freq: Once | INTRAVENOUS | Status: AC
  Administered 2014-06-23: 500 mL via INTRAVENOUS

## 2014-06-23 NOTE — Progress Notes (Signed)
PULMONARY / CRITICAL CARE MEDICINE   Name: Dustin Freeman MRN: [id number] DOB: July 21, 1955    ADMISSION DATE:  06/20/2014 CONSULTATION DATE:  06/23/2014  REFERRING MD :  EDP  CHIEF COMPLAINT:  Dizziness, cough, diarrhea  INITIAL PRESENTATION:  59 y/o M w/ PMHx of DM type II and PVD s/p stent placement of right external iliac artery, brought to [facility] ED 3/2 for dizziness, cough, diarrhea. Hospitalized 3 weeks ago for syncope and underwent stent placement in occluded right external iliac artery at that time.  Ever since being discharged, has had diarrhea.  In ED, CXR c/w RLL consolidation and patient remained hypotensive despite 4L IVF.  PCCM consulted for admission.  Asked to see patient because CT chest shows R hilar mass involving Bronchus Intermedius and pericardium. Biopsy consideration requested.  STUDIES:  CXR 3/2 >>> RLL consolidation CT chest 06/22/14  SIGNIFICANT EVENTS: 1/24 - 1/27 - admitted for syncope.  Found to have PVD with RLE claudication due to occluded right external iliac artery.  Underwent stent placement 3/2 - admit with HCAP 3/4- CT R hilar mass, pneumonia, bilat effusion  SUBJECTIVE:  Sputum clear, no blood. Tussive pleuritic sharp R lower lateral chest wall pain. Very weak, feels unable to stand or sit unsupported.   VITAL SIGNS: Temp:  [97.4 F (36.3 C)-100.3 F (37.9 C)] 97.7 F (36.5 C) (03/05 1519) Pulse Rate:  [62-115] 70 (03/05 1200) Resp:  [13-27] 20 (03/05 1200) BP: (73-121)/(41-74) 84/66 mmHg (03/05 1200) SpO2:  [87 %-98 %] 92 % (03/05 1540) FiO2 (%):  [45 %-55 %] 45 % (03/05 0330) Weight:  [62.7 kg (138 lb 3.7 oz)] 62.7 kg (138 lb 3.7 oz) (03/05 0330)   HEMODYNAMICS:   VENTILATOR SETTINGS: Vent Mode:  [-]  FiO2 (%):  [45 %-55 %] 45 % INTAKE / OUTPUT: Intake/Output      03/04 0701 - 03/05 0700 03/05 0701 - 03/06 0700   P.O. 480    I.V. (mL/kg) 2700 (43.1) 625 (10)   IV Piggyback 400 50   Total Intake(mL/kg) 3580 (57.1) 675 (10.8)   Urine  (mL/kg/hr) 1000 (0.7) 500 (0.8)   Total Output 1000 500   Net +2580 +175          PHYSICAL EXAMINATION: General: Frail adult male, in NAD. Intermittent cough.  Neuro: Awake A&O x 3, No focal neuro deficits.  HEENT: PERRL, EOMI, sclerae anicteric. Cardiovascular: RRR, no M/R/G.  Lungs: Respirations even and unlabored.  Coarse crackles RLL, Bibasilar dullness Abdomen: BS x 4, soft, NT/ND.  Musculoskeletal: No gross deformities, no edema.  Skin: Intact, warm, no rashes.  LABS:  CBC  Recent Labs Lab 06/21/14 0235 06/22/14 0312 06/23/14 0303  WBC 12.5* 13.0* 12.5*  HGB 9.6* 9.4* 9.2*  HCT 28.5* 27.8* 26.3*  PLT 394 380 368   BMET  Recent Labs Lab 06/21/14 0235 06/22/14 0312 06/23/14 0303  NA 129* 127* 127*  K 3.9 3.7 3.3*  CL 108 102 100  CO2 17* 20 23  BUN 6 6 <5*  CREATININE 0.54 0.57 0.58  GLUCOSE 87 107* 93   Electrolytes  Recent Labs Lab 06/21/14 0235 06/22/14 0312 06/23/14 0303  CALCIUM 6.6* 6.8* 6.7*  MG 1.8 2.0  --   PHOS 2.6  --   --    Sepsis Markers  Recent Labs Lab 06/20/14 1443 06/20/14 1600 06/22/14 0921  LATICACIDVEN 2.07* 2.2* 1.6    Liver Enzymes  Recent Labs Lab 06/20/14 1337 06/23/14 0303  AST 30 28  ALT 28 21  ALKPHOS 75 52  BILITOT 0.9 0.6  ALBUMIN 1.6* 1.2*    Cardiac Enzymes  Recent Labs Lab 06/20/14 1600  TROPONINI <0.03   Glucose  Recent Labs Lab 06/22/14 0731 06/22/14 1137 06/22/14 1645 06/22/14 2151 06/23/14 0753 06/23/14 1211  GLUCAP 107* 108* 112* 106* 95 106*    Imaging Ct Chest W Contrast  06/22/2014   CLINICAL DATA:  Peripheral vascular disease cardiac palpitations diabetes with cough and congestion currently, patient smokes, patient is homeless  EXAM: CT CHEST WITH CONTRAST  TECHNIQUE: Multidetector CT imaging of the chest was performed during intravenous contrast administration.  CONTRAST:  90mL OMNIPAQUE IOHEXOL 300 MG/ML  SOLN  COMPARISON:  06/21/2014  FINDINGS: There is extensive  infiltrate throughout the right lower lobe. The right lower lobe is densely consolidated. Infiltrate extends into the posterior aspects of the right upper lobe and throughout much of the right middle lobe, which is less severely involved the lower lobe. There is abnormal soft tissue in the right hilum measuring about 9 cm anterior to posterior by 3 cm left right seen best on image number 45. This extends from the superior hilum inferiorly to the level of the coronary sinus. This soft tissue is inseparable from the pericardium on the right, and causes mass effect on the bronchus intermedius, which is not visible as a result.  On the left there is mild to moderate deep tendon infiltrate in the lower lobe. There are small bilateral pleural effusions. There is mild pericardial thickening/ fluid. There is a 10 mm subcarinal lymph node.  There is coronary artery calcification. Minimal aortic calcifications is present. Thoracic inlet is normal. There are no acute musculoskeletal findings.  IMPRESSION: Severe post obstructive pneumonia right lower lobe with extension of pneumonia into the right middle lobe and right upper lobe.  Large soft tissue mass in the right hilum obstructs the bronchus intermedius. Malignancy suspected  There are bilateral pleural effusions. There is infiltrate in the left lower lobe which, although dependent in position, is concerning for pneumonia as well. Significant atelectasis is an alternative consideration.   Electronically Signed   By: Skipper Cliche M.D.   On: 06/22/2014 17:53   Dg Chest Port 1 View  06/21/2014   CLINICAL DATA:  Hypoxia.  Respiratory distress.  Initial encounter.  EXAM: PORTABLE CHEST - 1 VIEW  COMPARISON:  Radiographs ranging from 05/13/2014 through 06/21/2014.  FINDINGS: 1642 hours. There has been further slight worsening of the right infrahilar airspace disease, most consistent with right middle and lower lobe pneumonia. This could be on a post obstructive basis. There  is minimal patchy opacity in the left lower lobe. No edema, pneumothorax or significant pleural effusion identified. The heart size is stable. Multiple telemetry leads overlie the chest.  IMPRESSION: Continue slight worsening of right basilar pneumonia, possibly postobstructive in etiology based on prior studies.   Electronically Signed   By: Richardean Sale M.D.   On: 06/21/2014 16:57   CT chest- I reviewed images and agree with interpretation. Bibasilar pneumonia and effusions, R hilar mass appears to involve bronchus intermedius.  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 He is very weak, but within a few days he may be strong enough to tolerate bronch seeking endobronchial tumor or possible TBNA in R mainstem. Concern that tumor in pericardium may lead to tamponade. Consider thoracentesis R for decompression, cytology and cultures, but may only be inflammatory. Plan- I will look at him again  tomorrow, but not try to schedule bronch until I can present to group Monday AM.  PULMONARY A: HCAP rt base, aspiration? COPD by CXR - has never had PFT's Tobacco use disorder P:   Pulmonary hygiene. Continue tobacco cessation efforts. Abx - now on Zosyn Continue bronchodilators for now    CARDIOVASCULAR A:  Shock- hypovolemic (diarrhea, poor po intake) + early sepsis 2/2 HCAP PVD s/p stent placement to right external iliac artery 05/15/14 (Dr. Donnetta Hutching) P:  Per primary team. Don't see significant pericardial effusion yet.  RENAL A:   Hyponatremia Hypokalemia Mild Non-AG Acidosis; likely 2/2 volume repletion  P:   Saline maintenance Replace K as needed  GASTROINTESTINAL A:   Protein calorie malnutrition Nutrition malnurished Diarrhea resolved? at risk mesenteric ischemia, has no abdo pain P:   Per primary team  HEMATOLOGIC A:   Anemia; chronic disease vs dilutional Leukocytosis persists VTE Prophylaxis P:  Transfuse for Hgb <  7. SCD's / Heparin [place] Will need to stop anticoagulation for invasive procedures.  INFECTIOUS A:   HCAP; BLL infiltrate on CXR R/o C. Diff P:   BCx2 3/2 > neg UCx 3/2 > Sputum Cx 3/2 > C. Diff PCR 3/2 > Abx: Vanc, start date 3/2  Abx: Levaquin, start date 3/2 >>>3/3 Zosyn 3/4>>    CD Young, MD   PCCM Pgr: [facility] 06/23/2014 4:29 PM

## 2014-06-23 NOTE — Progress Notes (Signed)
[place] TEAM 1 - Stepdown/ICU TEAM Progress Note  Dustin Freeman OHY:[id number] DOB: June 24, 1955 DOA: 06/20/2014 PCP: No PCP Per Patient  Admit HPI / Brief Narrative: 59 y/o M w/ Hx of DM type II and PVD s/p stent placement right external iliac artery, brought to [facility] ED 3/2 for dizziness, cough, diarrhea. Hospitalized 3 weeks prior for syncope and underwent stent placement in occluded right external iliac artery. Ever since being discharged, has had diarrhea. In ED, CXR c/w RLL consolidation and patient remained hypotensive despite 4L IVF. PCCM consulted for admission.  Significant Events: 1/24 - 1/27 - admitted for syncope - found to have PVD with RLE claudication due to occluded right external iliac artery - underwent stent placement 3/2 - admit with ?HCAP  HPI/Subjective: Pt had a coughing spell leading to desaturations last night.  No new complaints today and reports feeling better overall.  Denies cp, n/v, abdom pain, or HA.   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 Donnetta Hutching) Foot is warm w/ pulse - pt  denies complaints - cont Plavix   Severe malnutrition in the context of chronic illness Appears to be related to limited access to food / homelessness - eating well   Code Status: FULL Family Communication: no family present at time of exam Disposition Plan: SDU  Consultants: PCCM > TRH  Procedures: none  Antibiotics: Cefepime 3/2 > 3/4 Levaquin 3/2 Vanc 3/2 > 3/4 Zosyn 3/4 >  DVT prophylaxis: SQ heparin   Objective: Blood pressure 73/42, pulse 66, temperature 97.5 F (36.4 C), temperature source Oral, resp. rate 18, height 5\' 9"  (1.753 m), weight 62.7 kg (138 lb 3.7 oz), SpO2 92 %.  Intake/Output Summary (Last 24 hours) at 06/23/14 1059 Last data filed at 06/23/14 1010  Gross per 24 hour  Intake   3290 ml  Output   1500 ml  Net   1790 ml    Exam: General: No acute respiratory distress - alert and pleasant Lungs: R basilar crackles - no wheeze  Cardiovascular: Regular rate and rhythm without murmur gallop or rub normal S1 and S2 Abdomen: Nontender, nondistended, soft, bowel sounds positive, no rebound, no ascites, no appreciable mass Extremities: No significant cyanosis, clubbing, or edema bilateral lower extremities Vasc:  2+ DP pulses B LE - warm to touch   Data Reviewed: Basic Metabolic Panel:  Recent Labs Lab 06/20/14 1337 06/21/14 0235 06/22/14 0312 06/23/14 0303  NA 129* 129* 127* 127*  K 4.6 3.9 3.7 3.3*  CL 102 108 102 100  CO2 21 17* 20 23  GLUCOSE 106* 87 107* 93  BUN 9 6 6  <5*  CREATININE 0.63 0.54 0.57 0.58  CALCIUM 6.8* 6.6* 6.8* 6.7*  MG  --  1.8 2.0  --   PHOS  --  2.6  --   --     Liver Function Tests:  Recent Labs Lab 06/20/14 1337 06/23/14 0303  AST 30 28  ALT 28 21  ALKPHOS 75 52  BILITOT 0.9 0.6  PROT 5.0* 4.0*  ALBUMIN 1.6* 1.2*    Recent Labs Lab 06/20/14 1337 06/21/14 1400  LIPASE 29 21  AMYLASE  --  50   CBC:  Recent Labs Lab 06/20/14 1337 06/21/14 0235 06/22/14 0312 06/23/14 0303  WBC 14.1* 12.5*  13.0* 12.5*  NEUTROABS 12.3*  --  10.8*  --   HGB 10.1* 9.6* 9.4* 9.2*  HCT 30.4* 28.5* 27.8* 26.3*  MCV 87.1 87.4 87.7 84.6  PLT 417* 394 380 368   Cardiac Enzymes:  Recent Labs Lab 06/20/14 1600  TROPONINI <0.03   CBG:  Recent Labs Lab 06/22/14 0731 06/22/14 1137 06/22/14 1645 06/22/14 2151 06/23/14 0753  GLUCAP 107* 108* 112* 106* 95    Recent Results (from the past 240 hour(s))  Blood Culture (routine x 2)     Status: None (Preliminary result)   Collection Time: 06/20/14  1:49 PM  Result Value Ref Range Status   Specimen Description BLOOD ARM RIGHT  Final   Special Requests BOTTLES DRAWN AEROBIC AND ANAEROBIC 5CC  Final   Culture   Final           BLOOD CULTURE RECEIVED NO GROWTH TO DATE CULTURE WILL BE HELD FOR 5 DAYS BEFORE ISSUING A FINAL NEGATIVE REPORT Performed at [facility]    Report Status PENDING  Incomplete  Urine culture     Status: None   Collection Time: 06/20/14  2:12 PM  Result Value Ref Range Status   Specimen Description URINE, RANDOM  Final   Special Requests NONE  Final   Colony Count NO GROWTH Performed at [facility]   Final   Culture NO GROWTH Performed at [facility]   Final   Report Status 06/21/2014 FINAL  Final  Blood Culture (routine x 2)     Status: None (Preliminary result)   Collection Time: 06/20/14  2:49 PM  Result Value Ref Range Status   Specimen Description BLOOD RIGHT FOREARM  Final   Special Requests BOTTLES DRAWN AEROBIC AND ANAEROBIC 4CC  Final   Culture   Final           BLOOD CULTURE RECEIVED NO GROWTH TO DATE CULTURE WILL BE HELD FOR 5 DAYS BEFORE ISSUING A FINAL NEGATIVE REPORT Performed at [facility]    Report Status PENDING  Incomplete  MRSA PCR Screening     Status: None   Collection Time: 06/20/14  5:20 PM  Result Value Ref Range Status   MRSA by PCR NEGATIVE NEGATIVE Final    Comment:        The GeneXpert MRSA Assay (FDA approved for NASAL specimens only), is one  component of a comprehensive MRSA colonization surveillance program. It is not intended to diagnose MRSA infection nor to guide or monitor treatment for MRSA infections.   Culture, expectorated sputum-assessment     Status: None   Collection Time: 06/21/14  3:39 PM  Result Value Ref Range Status   Specimen Description SPUTUM  Final   Special Requests NONE  Final   Sputum evaluation   Final    MICROSCOPIC  FINDINGS SUGGEST THAT THIS SPECIMEN IS NOT REPRESENTATIVE OF LOWER RESPIRATORY SECRETIONS. PLEASE RECOLLECT. Avis Epley RN [phone] 06/21/14 A BROWNING    Report Status 06/21/2014 FINAL  Final     Studies:  Recent x-ray studies have been reviewed in detail by the Attending Physician  Scheduled Meds:  Scheduled Meds: . antiseptic oral rinse  7 mL Mouth Rinse q12n4p  . chlorhexidine  15 mL Mouth Rinse BID  . clopidogrel  75 mg Oral Daily  . feeding supplement (GLUCERNA SHAKE)  237 mL Oral TID BM  . heparin  5,000 Units Subcutaneous 3 times per day  . insulin aspart  0-9 Units Subcutaneous TID WC  . ipratropium-albuterol  3 mL Nebulization QID  . multivitamin with minerals  1 tablet Oral Daily  . piperacillin-tazobactam (ZOSYN)  IV  3.375 g Intravenous Q8H  . vancomycin  750 mg Intravenous Q8H    Time spent on care of this patient: 35 mins   Ziva Nunziata T , MD   [facility] Office  [phone] Pager - Text Page per Shea Evans as per below:  On-Call/Text Page:      [email or web address]      password [email or web address]  If 7PM-7AM, please contact night-coverage [email or web address] Password [email or web address] 06/23/2014, 10:59 AM   LOS: 3 days

## 2014-06-23 NOTE — Progress Notes (Signed)
Pt has had multiple coughing fits and each time the pt has desated in to the 70's. Pt was able to recover his pox into the low 90's. At 2355 pt had a coughing fit and he desated in the 70's and his pox did not recover. Respiratory therapist paged. Pt placed on a venti mask at 55%. Respiratory therapist at bed side. Pt pox is 88-92% on the 55% venti mask. Np paged and gave orders. Will continue to monitor pt.

## 2014-06-23 NOTE — Evaluation (Signed)
Clinical/Bedside Swallow Evaluation Patient Details  Name: Dustin Freeman MRN: [id number] Date of Birth: 1955/08/30  Today's Date: 06/23/2014 Time: SLP Start Time (ACUTE ONLY): 0848 SLP Stop Time (ACUTE ONLY): 0908 SLP Time Calculation (min) (ACUTE ONLY): 20 min  Past Medical History:  Past Medical History  Diagnosis Date  . History of palpitations     During [employer] intake exam, not since  . DM (diabetes mellitus)   . PVD (peripheral vascular disease)     s/p stent to right external iliac artery (05/15/14 - Dr. Donnetta Hutching)  . Collagen vascular disease    Past Surgical History:  Past Surgical History  Procedure Laterality Date  . Lower extremity angiogram N/A 05/15/2014    Procedure: LOWER EXTREMITY ANGIOGRAM;  Surgeon: Serafina Mitchell, MD;  Location: [facility] CATH LAB;  Service: Cardiovascular;  Laterality: N/A;   HPI:  59 y/o male with past medical history of DMII, tobacco use,  and PVD s/p stent placement of right external iliac artery. Presented to [facility] ED with dizziness, cough, diarrhea. Hospitalized 3 weeks ago for syncope and underwent stent placement in occluded right external iliac artery at this time. Ever since discharge, has had diarrhea. In ED, CXR c/w RLL consolidation and patient remained hypotensive despite 4L IVF (HCAP v/s Aspiration PNA v/s postobustructive PNA).    Assessment / Plan / Recommendation Clinical Impression  Patient presents with a functional oropharyngeal swallow without overt indication of aspiration. Skilled observation complete during am meal. Patient with very rapid rate of intake contributing to globus sensation and mid sternal pain which subsided with time and sips of liquids. Educated patient on importance of slow rate of po intake to facilitate improved comfort with meals as well as decrease risk of a reflux related aspiration event. No further SLP f/u indicated at this time. If right sided PNAs become recurrent in nature, may wish to consider instrumental  evaluation of swallowing and/or esophageal w/u however not indicated at this time.     Aspiration Risk  Mild    Diet Recommendation Regular;Thin liquid   Liquid Administration via: Cup;Straw Medication Administration: Whole meds with liquid Supervision: Patient able to self feed Compensations: Slow rate;Small sips/bites;Follow solids with liquid Postural Changes and/or Swallow Maneuvers: Seated upright 90 degrees;Upright 30-60 min after meal    Other  Recommendations Oral Care Recommendations: Oral care BID   Follow Up Recommendations  None    Frequency and Duration        Pertinent Vitals/Pain n/a     Swallow Study    General HPI: 59 y/o male with past medical history of DMII, tobacco use,  and PVD s/p stent placement of right external iliac artery. Presented to [facility] ED with dizziness, cough, diarrhea. Hospitalized 3 weeks ago for syncope and underwent stent placement in occluded right external iliac artery at this time. Ever since discharge, has had diarrhea. In ED, CXR c/w RLL consolidation and patient remained hypotensive despite 4L IVF (HCAP v/s Aspiration PNA v/s postobustructive PNA).  Type of Study: Bedside swallow evaluation Previous Swallow Assessment: none Diet Prior to this Study: Regular;Thin liquids Temperature Spikes Noted: No Respiratory Status: Nasal cannula History of Recent Intubation: No Behavior/Cognition: Alert;Cooperative;Pleasant mood;Hard of hearing Oral Cavity - Dentition: Dentures, not available (spouse to bring in ) Self-Feeding Abilities: Able to feed self Patient Positioning: Upright in bed Baseline Vocal Quality: Clear Volitional Cough: Strong Volitional Swallow: Able to elicit    Oral/Motor/Sensory Function Overall Oral Motor/Sensory Function: Appears within functional limits for tasks assessed   Ice  Chips Ice chips: Not tested   Thin Liquid Thin Liquid: Within functional limits Presentation: Cup;Self Fed;Straw    Nectar Thick Nectar Thick  Liquid: Not tested   Honey Thick Honey Thick Liquid: Not tested   Puree Puree: Not tested   Solid   GO   Dustin Murfin MA, CCC-SLP [phone]  Solid: Within functional limits Presentation: [place] 06/23/2014,9:10 AM

## 2014-06-23 NOTE — Progress Notes (Signed)
Text paged Dr. Thereasa Solo and informed of pts BP 73/42.

## 2014-06-24 LAB — PROTIME-INR
INR: 1.21 (ref 0.00–1.49)
Prothrombin Time: 15.4 seconds — ABNORMAL HIGH (ref 11.6–15.2)

## 2014-06-24 LAB — GLUCOSE, CAPILLARY
GLUCOSE-CAPILLARY: 96 mg/dL (ref 70–99)
GLUCOSE-CAPILLARY: 103 mg/dL — AB (ref 70–99)
GLUCOSE-CAPILLARY: 99 mg/dL (ref 70–99)

## 2014-06-24 MED ORDER — MORPHINE SULFATE 2 MG/ML IJ SOLN: 1.0000 mg | INTRAMUSCULAR | Status: DC | PRN

## 2014-06-24 NOTE — Progress Notes (Signed)
PULMONARY / CRITICAL CARE MEDICINE   Name: Dustin Freeman MRN: [id number] DOB: [birth year]    ADMISSION DATE:  06/20/2014 CONSULTATION DATE:  06/24/2014  REFERRING MD :  EDP  CHIEF COMPLAINT:  Dizziness, cough, diarrhea  INITIAL PRESENTATION:  59 y/o M w/ PMHx of DM type II and PVD s/p stent placement of right external iliac artery, brought to [facility] ED 3/2 for dizziness, cough, diarrhea. Hospitalized 3 weeks ago for syncope and underwent stent placement in occluded right external iliac artery at that time.  Ever since being discharged, has had diarrhea.  In ED, CXR c/w RLL consolidation and patient remained hypotensive despite 4L IVF.  PCCM consulted for admission.  Asked to see patient 3/5 because CT chest shows R hilar mass involving Bronchus Intermedius and pericardium. Biopsy consideration requested.  STUDIES:  CXR 3/2 >>> RLL consolidation CT chest 06/22/14  SIGNIFICANT EVENTS: 1/24 - 1/27 - admitted for syncope.  Found to have PVD with RLE claudication due to occluded right external iliac artery.  Underwent stent placement 3/2 - admit with HCAP 3/4- CT R hilar mass, pneumonia, bilat effusion  SUBJECTIVE:  Cough only producing clear sputum.  VITAL SIGNS: Temp:  [97.4 F (36.3 C)-99.5 F (37.5 C)] 99 F (37.2 C) (03/06 0705) Pulse Rate:  [70-100] 83 (03/06 0705) Resp:  [19-26] 23 (03/06 0705) BP: (84-119)/(53-66) 116/66 mmHg (03/06 0705) SpO2:  [87 %-98 %] 94 % (03/06 0936) FiO2 (%):  [40 %-45 %] 45 % (03/06 0936) Weight:  [62.5 kg (137 lb 12.6 oz)] 62.5 kg (137 lb 12.6 oz) (03/06 0315)   HEMODYNAMICS:   VENTILATOR SETTINGS: Vent Mode:  [-]  FiO2 (%):  [40 %-45 %] 45 % INTAKE / OUTPUT: Intake/Output      03/05 0701 - 03/06 0700 03/06 0701 - 03/07 0700   P.O. 120    I.V. (mL/kg) 3000 (48)    IV Piggyback 100    Total Intake(mL/kg) 3220 (51.5)    Urine (mL/kg/hr) 2075 (1.4)    Total Output 2075     Net +1145            PHYSICAL EXAMINATION: General: Frail adult  male, in NAD. Intermittent cough.  Neuro: Awake A&O x 3, No focal neuro deficits.  HEENT: PERRL, EOMI, sclerae anicteric. Cardiovascular: RRR, no M/R/G.  Lungs: Respirations even and unlabored.  Coarse crackles RLL, Bibasilar dullness Abdomen: BS x 4, soft, NT/ND.  Musculoskeletal: No gross deformities, no edema.  Skin: Intact, warm, no rashes.  LABS:  CBC  Recent Labs Lab 06/21/14 0235 06/22/14 0312 06/23/14 0303  WBC 12.5* 13.0* 12.5*  HGB 9.6* 9.4* 9.2*  HCT 28.5* 27.8* 26.3*  PLT 394 380 368   BMET  Recent Labs Lab 06/21/14 0235 06/22/14 0312 06/23/14 0303  NA 129* 127* 127*  K 3.9 3.7 3.3*  CL 108 102 100  CO2 17* 20 23  BUN 6 6 <5*  CREATININE 0.54 0.57 0.58  GLUCOSE 87 107* 93   Electrolytes  Recent Labs Lab 06/21/14 0235 06/22/14 0312 06/23/14 0303  CALCIUM 6.6* 6.8* 6.7*  MG 1.8 2.0  --   PHOS 2.6  --   --    Sepsis Markers  Recent Labs Lab 06/20/14 1443 06/20/14 1600 06/22/14 0921  LATICACIDVEN 2.07* 2.2* 1.6    Liver Enzymes  Recent Labs Lab 06/20/14 1337 06/23/14 0303  AST 30 28  ALT 28 21  ALKPHOS 75 52  BILITOT 0.9 0.6  ALBUMIN 1.6* 1.2*    Cardiac Enzymes  Recent Labs Lab 06/20/14 1600  TROPONINI <0.03   Glucose  Recent Labs Lab 06/22/14 2151 06/23/14 0753 06/23/14 1211 06/23/14 1810 06/23/14 2135 06/24/14 0751  GLUCAP 106* 95 106* 117* 112* 96    Imaging Ct Chest W Contrast  06/22/2014   CLINICAL DATA:  Peripheral vascular disease cardiac palpitations diabetes with cough and congestion currently, patient smokes, patient is homeless  EXAM: CT CHEST WITH CONTRAST  TECHNIQUE: Multidetector CT imaging of the chest was performed during intravenous contrast administration.  CONTRAST:  34mL OMNIPAQUE IOHEXOL 300 MG/ML  SOLN  COMPARISON:  06/21/2014  FINDINGS: There is extensive infiltrate throughout the right lower lobe. The right lower lobe is densely consolidated. Infiltrate extends into the posterior aspects of  the right upper lobe and throughout much of the right middle lobe, which is less severely involved the lower lobe. There is abnormal soft tissue in the right hilum measuring about 9 cm anterior to posterior by 3 cm left right seen best on image number 45. This extends from the superior hilum inferiorly to the level of the coronary sinus. This soft tissue is inseparable from the pericardium on the right, and causes mass effect on the bronchus intermedius, which is not visible as a result.  On the left there is mild to moderate deep tendon infiltrate in the lower lobe. There are small bilateral pleural effusions. There is mild pericardial thickening/ fluid. There is a 10 mm subcarinal lymph node.  There is coronary artery calcification. Minimal aortic calcifications is present. Thoracic inlet is normal. There are no acute musculoskeletal findings.  IMPRESSION: Severe post obstructive pneumonia right lower lobe with extension of pneumonia into the right middle lobe and right upper lobe.  Large soft tissue mass in the right hilum obstructs the bronchus intermedius. Malignancy suspected  There are bilateral pleural effusions. There is infiltrate in the left lower lobe which, although dependent in position, is concerning for pneumonia as well. Significant atelectasis is an alternative consideration.   Electronically Signed   By: Skipper Cliche M.D.   On: 06/22/2014 17:53   CT chest- 3/5- I reviewed images and agree with interpretation. Bibasilar pneumonia and effusions, R hilar mass appears to involve bronchus intermedius and pericardium.  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 He is very weak, but within a few days he may be strong enough to tolerate bronch seeking endobronchial tumor or possible TBNA in R mainstem. Concern that tumor in pericardium may lead to tamponade. Consider thoracentesis R for decompression, cytology and cultures, but may only be  inflammatory. Sputum cx non-diagnostic Plan- Ordered PT INR and will discuss bronch with group Monday 3/7  PULMONARY A: HCAP rt base, aspiration? COPD by CXR - has never had PFT's Tobacco use disorder P:   Pulmonary hygiene. Continue tobacco cessation efforts. Abx - now on Zosyn Continue bronchodilators for now    CARDIOVASCULAR A:  Shock- hypovolemic (diarrhea, poor po intake) + early sepsis 2/2 HCAP PVD s/p stent placement to right external iliac artery 05/15/14 (Dr. Donnetta Hutching) P:  Per primary team. Don't see significant pericardial effusion yet.  RENAL A:   Hyponatremia Hypokalemia Mild Non-AG Acidosis; likely 2/2 volume repletion  P:   Saline maintenance Replace K as needed  GASTROINTESTINAL A:   Protein calorie malnutrition Nutrition malnurished Diarrhea resolved? at risk mesenteric ischemia, has no abdo pain P:   Per primary team  HEMATOLOGIC A:   Anemia; chronic disease vs dilutional Leukocytosis persists  VTE Prophylaxis P:  Transfuse for Hgb < 7. SCD's / Heparin [place] Will need to stop anticoagulation for invasive procedures.  INFECTIOUS A:   HCAP; BLL infiltrate on CXR R/o C. Diff P:   BCx2 3/2 > neg UCx 3/2 > Sputum Cx 3/2 > C. Diff PCR 3/2 > Abx: Vanc, start date 3/2  Abx: Levaquin, start date 3/2 >>>3/3 Zosyn 3/4>>  Global- 3/6- Large R hilar mass, pneumonia c/w post-obstructive, bilateral effusions. Day 2 Zosyn. Consider bronch for tissue dx.  CD Annamaria Boots, MD   PCCM Pgr: [phone] [facility] 06/24/2014 10:52 AM

## 2014-06-24 NOTE — Progress Notes (Signed)
[facility] TEAM 1 - Stepdown/ICU TEAM Progress Note  DEVONN GIAMPIETRO ZHY:[id number] DOB: Feb 16, 1956 DOA: 06/20/2014 PCP: No PCP Per Patient  Admit HPI / Brief Narrative: 59 y/o M w/ Hx of DM type II and PVD s/p stent placement right external iliac artery, brought to [facility] ED 3/2 for dizziness, cough, diarrhea. Hospitalized 3 weeks prior for syncope and underwent stent placement in occluded right external iliac artery. Ever since being discharged, has had diarrhea. In ED, CXR c/w RLL consolidation and patient remained hypotensive despite 4L IVF. PCCM consulted for admission.  Significant Events: 1/24 - 1/27 - admitted for syncope - found to have PVD with RLE claudication due to occluded right external iliac artery - underwent stent placement 3/2 - admit with ?HCAP  HPI/Subjective: Continues to have spells with signif desaturation, presently back on NRB as a result.  Denies other new complaints today.  No cp, n/v, f/c, or ha.      Assessment/Plan:  Extensive RLL, RML, and partial RUL postobustructive PNA - LLL infiltrate Cont emperic broad coverage - CT chest ordered due to concern this could be a postobstructive process (CXRs at prior admit raised concern of R hilar enlargement) confirmed large R hilar mass - Pulm has seen and plan is for probable bronch once pt more stable clinically - pt has been informed that this is almost definitely CA  Large soft tissue mass in the right hilum  obstructs the bronchus intermedius - PCCM following - see discussion above   Hypovolemic Shock - Sepsis due to PNA - resolved Cortisol normal - BP has now stabilized - shock resolved - sepsis physiology much improved  Hyponatremia  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 Donnetta Hutching) Foot is warm w/ pulse - pt denies complaints - cont Plavix   Severe malnutrition in the context of chronic illness Albumin 1.2 - nutrition consulted   Code Status: FULL Family Communication: no family present at time of exam Disposition Plan: SDU  Consultants: PCCM > TRH  Procedures: none  Antibiotics: Cefepime 3/2 > 3/3 Levaquin 3/2 Vanc 3/2 > 3/4 Zosyn 3/4 >  DVT prophylaxis: SQ heparin   Objective: Blood pressure 116/66, pulse 83, temperature 99 F (37.2 C), temperature source Oral, resp. rate 23, height 5\' 9"  (1.753 m), weight 62.5 kg (137 lb 12.6 oz), SpO2 90 %.  Intake/Output Summary (Last 24 hours) at 06/24/14 1457 Last data filed at 06/24/14 1100  Gross per 24 hour  Intake   3145 ml  Output   2075 ml  Net   1070 ml   Exam: General: No acute respiratory distress evident - alert and pleasant Lungs: R basilar crackles - no wheeze  Cardiovascular: Regular rate and rhythm  Abdomen: Nontender, nondistended, soft, bowel sounds positive, no rebound, no ascites, no appreciable mass Extremities: No significant cyanosis, clubbing, or edema bilateral lower extremities Vasc:  1+ DP pulses B LE - warm to touch   Data  Reviewed: Basic Metabolic Panel:  Recent Labs Lab 06/20/14 1337 06/21/14 0235 06/22/14 0312 06/23/14 0303  NA 129* 129* 127* 127*  K 4.6 3.9 3.7 3.3*  CL 102 108 102 100  CO2 21 17* 20 23  GLUCOSE 106* 87 107* 93  BUN 9 6 6  <5*  CREATININE 0.63 0.54 0.57 0.58  CALCIUM 6.8* 6.6* 6.8* 6.7*  MG  --  1.8 2.0  --   PHOS  --  2.6  --   --     Liver Function Tests:  Recent Labs Lab 06/20/14 1337 06/23/14 0303  AST 30 28  ALT 28 21  ALKPHOS 75 52  BILITOT 0.9 0.6  PROT 5.0*  4.0*  ALBUMIN 1.6* 1.2*    Recent Labs Lab 06/20/14 1337 06/21/14 1400  LIPASE 29 21  AMYLASE  --  50   CBC:  Recent Labs Lab 06/20/14 1337 06/21/14 0235 06/22/14 0312 06/23/14 0303  WBC 14.1* 12.5* 13.0* 12.5*  NEUTROABS 12.3*  --  10.8*  --   HGB 10.1* 9.6* 9.4* 9.2*  HCT 30.4* 28.5* 27.8* 26.3*  MCV 87.1 87.4 87.7 84.6  PLT 417* 394 380 368   Cardiac Enzymes:  Recent Labs Lab 06/20/14 1600  TROPONINI <0.03   CBG:  Recent Labs Lab 06/23/14 1211 06/23/14 1810 06/23/14 2135 06/24/14 0751 06/24/14 1222  GLUCAP 106* 117* 112* 96 103*    Recent Results (from the past 240 hour(s))  Blood Culture (routine x 2)     Status: None (Preliminary result)   Collection Time: 06/20/14  1:49 PM  Result Value Ref Range Status   Specimen Description BLOOD ARM RIGHT  Final   Special Requests BOTTLES DRAWN AEROBIC AND ANAEROBIC 5CC  Final   Culture   Final           BLOOD CULTURE RECEIVED NO GROWTH TO DATE CULTURE WILL BE HELD FOR 5 DAYS BEFORE ISSUING A FINAL NEGATIVE REPORT Performed at [facility]    Report Status PENDING  Incomplete  Urine culture     Status: None   Collection Time: 06/20/14  2:12 PM  Result Value Ref Range Status   Specimen Description URINE, RANDOM  Final   Special Requests NONE  Final   Colony Count NO GROWTH Performed at [facility]   Final   Culture NO GROWTH Performed at [facility]   Final   Report Status 06/21/2014 FINAL  Final  Blood Culture (routine x 2)     Status: None (Preliminary result)   Collection Time: 06/20/14  2:49 PM  Result Value Ref Range Status   Specimen Description BLOOD RIGHT FOREARM  Final   Special Requests BOTTLES DRAWN AEROBIC AND ANAEROBIC 4CC  Final   Culture   Final           BLOOD CULTURE RECEIVED NO GROWTH TO DATE CULTURE WILL BE HELD FOR 5 DAYS BEFORE ISSUING A FINAL NEGATIVE REPORT Performed at [facility]    Report Status PENDING  Incomplete  MRSA PCR Screening      Status: None   Collection Time: 06/20/14  5:20 PM  Result Value Ref Range Status   MRSA by PCR NEGATIVE NEGATIVE Final    Comment:        The GeneXpert MRSA Assay (FDA approved for NASAL specimens only), is one component of a comprehensive MRSA colonization surveillance program. It is not intended to diagnose MRSA infection nor to guide or monitor treatment for MRSA infections.   Culture,  expectorated sputum-assessment     Status: None   Collection Time: 06/21/14  3:39 PM  Result Value Ref Range Status   Specimen Description SPUTUM  Final   Special Requests NONE  Final   Sputum evaluation   Final    MICROSCOPIC FINDINGS SUGGEST THAT THIS SPECIMEN IS NOT REPRESENTATIVE OF LOWER RESPIRATORY SECRETIONS. PLEASE RECOLLECT. Avis Epley RN [id number] 06/21/14 A BROWNING    Report Status 06/21/2014 FINAL  Final     Studies:  Recent x-ray studies have been reviewed in detail by the Attending Physician  Scheduled Meds:  Scheduled Meds: . antiseptic oral rinse  7 mL Mouth Rinse q12n4p  . chlorhexidine  15 mL Mouth Rinse BID  . clopidogrel  75 mg Oral Daily  . feeding supplement (GLUCERNA SHAKE)  237 mL Oral TID BM  . heparin  5,000 Units Subcutaneous 3 times per day  . insulin aspart  0-9 Units Subcutaneous TID WC  . ipratropium-albuterol  3 mL Nebulization QID  . multivitamin with minerals  1 tablet Oral Daily  . piperacillin-tazobactam (ZOSYN)  IV  3.375 g Intravenous Q8H    Time spent on care of this patient: 35 mins   Shelden Raborn T , MD   [facility] Office  [phone] Pager - Text Page per Shea Evans as per below:  On-Call/Text Page:      [email or web address]      password [email or web address]  If 7PM-7AM, please contact night-coverage [email or web address] Password [email or web address] 06/24/2014, 2:57 PM   LOS: 4 days

## 2014-06-25 ENCOUNTER — Inpatient Hospital Stay (HOSPITAL_COMMUNITY): Payer: Medicaid Other

## 2014-06-25 LAB — COMPREHENSIVE METABOLIC PANEL
ALBUMIN: 1.2 g/dL — AB (ref 3.5–5.2)
ALT: 23 U/L (ref 0–53)
AST: 31 U/L (ref 0–37)
Alkaline Phosphatase: 54 U/L (ref 39–117)
Anion gap: 4 — ABNORMAL LOW (ref 5–15)
BUN: <5 mg/dL — ABNORMAL LOW (ref 6–23)
CO2: 24 mmol/L (ref 19–32)
Calcium: 6.7 mg/dL — ABNORMAL LOW (ref 8.4–10.5)
Chloride: 100 mmol/L (ref 96–112)
Creatinine, Ser: 0.5 mg/dL (ref 0.50–1.35)
GFR calc Af Amer: >90 mL/min (ref >90)
GFR calc non Af Amer: >90 mL/min (ref >90)
GLUCOSE: 112 mg/dL — AB (ref 70–99)
POTASSIUM: 3.2 mmol/L — AB (ref 3.5–5.1)
SODIUM: 128 mmol/L — AB (ref 135–145)
TOTAL PROTEIN: 4.4 g/dL — AB (ref 6.0–8.3)
Total Bilirubin: 0.4 mg/dL (ref 0.3–1.2)

## 2014-06-25 LAB — LACTATE DEHYDROGENASE, PLEURAL OR PERITONEAL FLUID: LD FL: 75 U/L — AB (ref 3–23)

## 2014-06-25 LAB — GLUCOSE, CAPILLARY
GLUCOSE-CAPILLARY: 125 mg/dL — AB (ref 70–99)
Glucose-Capillary: 123 mg/dL — ABNORMAL HIGH (ref 70–99)
Glucose-Capillary: 132 mg/dL — ABNORMAL HIGH (ref 70–99)
Glucose-Capillary: 95 mg/dL (ref 70–99)
Glucose-Capillary: 110 mg/dL — ABNORMAL HIGH (ref 70–99)

## 2014-06-25 LAB — PROTEIN, BODY FLUID: Total protein, fluid: <3 g/dL

## 2014-06-25 LAB — PROTEIN, TOTAL: TOTAL PROTEIN: 4.6 g/dL — AB (ref 6.0–8.3)

## 2014-06-25 LAB — LACTATE DEHYDROGENASE: LDH: 115 U/L (ref 94–250)

## 2014-06-25 LAB — BODY FLUID CELL COUNT WITH DIFFERENTIAL
Eos, Fluid: NONE SEEN %
LYMPHS FL: 18 %
MONOCYTE-MACROPHAGE-SEROUS FLUID: 7 % — AB (ref 50–90)
Neutrophil Count, Fluid: 75 % — ABNORMAL HIGH (ref 0–25)
Total Nucleated Cell Count, Fluid: 897 cu mm (ref 0–1000)

## 2014-06-25 LAB — CBC
HCT: 26.5 % — ABNORMAL LOW (ref 39.0–52.0)
Hemoglobin: 9.2 g/dL — ABNORMAL LOW (ref 13.0–17.0)
MCH: 29.3 pg (ref 26.0–34.0)
MCHC: 34.7 g/dL (ref 30.0–36.0)
MCV: 84.4 fL (ref 78.0–100.0)
Platelets: 366 10*3/uL (ref 150–400)
RBC: 3.14 MIL/uL — ABNORMAL LOW (ref 4.22–5.81)
RDW: 14.5 % (ref 11.5–15.5)
WBC: 12.2 10*3/uL — ABNORMAL HIGH (ref 4.0–10.5)

## 2014-06-25 LAB — CHOLESTEROL, TOTAL: CHOLESTEROL: 86 mg/dL (ref 0–200)

## 2014-06-25 LAB — MAGNESIUM: MAGNESIUM: 1.8 mg/dL (ref 1.5–2.5)

## 2014-06-25 LAB — PHOSPHORUS: Phosphorus: 2.6 mg/dL (ref 2.3–4.6)

## 2014-06-25 MED ORDER — POTASSIUM CHLORIDE CRYS ER 20 MEQ PO TBCR
40.0000 meq | EXTENDED_RELEASE_TABLET | Freq: Two times a day (BID) | ORAL | Status: AC
  Administered 2014-06-25 – 2014-06-26 (×3): 40 meq via ORAL
  Filled 2014-06-25 (×3): qty 2

## 2014-06-25 NOTE — Procedures (Signed)
Thoracentesis Procedure Note  Pre-operative Diagnosis: right effusion  Post-operative Diagnosis: same  Indications: evaluation   Procedure Details  Consent: Informed consent was obtained. Risks of the procedure were discussed including: infection, bleeding, pain, pneumothorax.  Under sterile conditions the patient was positioned. Betadine solution and sterile drapes were utilized.  1% buffered lidocaine was used to anesthetize the pleural space which was identified via real time [place]. Fluid was obtained without any difficulties and minimal blood loss.  A dressing was applied to the wound and wound care instructions were provided.   Findings 1200 ml of clear pleural fluid was obtained. A sample was sent to Pathology for cell counts, cytology as well as for infection analysis.  Complications:  None; patient tolerated the procedure well.          Condition: stable  Plan A follow up chest x-ray was ordered. Bed Rest for 0 hours. Tylenol 650 mg. for pain.  U/S guidance used.  I was present and supervised the entire procedure.  Rush Farmer, M.D. [facility] Pulmonary/Critical Care Medicine. Pager: [phone]. After hours pager: [phone].

## 2014-06-25 NOTE — Progress Notes (Signed)
Post-procedure patient with complaints of pain right chest. No other complaints.  Continue to watch.

## 2014-06-25 NOTE — Progress Notes (Signed)
PULMONARY / CRITICAL CARE MEDICINE   Name: Dustin Freeman MRN: [id number] DOB: Oct 20, 1955    ADMISSION DATE:  06/20/2014 CONSULTATION DATE:  06/25/2014  REFERRING MD :  EDP  CHIEF COMPLAINT:  Dizziness, cough, diarrhea  INITIAL PRESENTATION:  59 y/o M w/ PMHx of DM type II and PVD s/p stent placement of right external iliac artery, brought to [facility] ED 3/2 for dizziness, cough, diarrhea. Hospitalized 3 weeks ago for syncope and underwent stent placement in occluded right external iliac artery at that time.  Ever since being discharged, has had diarrhea.  In ED, CXR c/w RLL consolidation and patient remained hypotensive despite 4L IVF.  PCCM consulted for admission.  Asked to see patient 3/5 because CT chest shows R hilar mass involving Bronchus Intermedius and pericardium. Biopsy consideration requested.  STUDIES:  CXR 3/2 >>> RLL consolidation CT chest 06/22/14  SIGNIFICANT EVENTS: 1/24 - 1/27 - admitted for syncope.  Found to have PVD with RLE claudication due to occluded right external iliac artery.  Underwent stent placement 3/2 - admit with HCAP 3/4- CT R hilar mass, pneumonia, bilat effusion  SUBJECTIVE:  Cough only producing clear sputum.  VITAL SIGNS: Temp:  [98.7 F (37.1 C)-100.5 F (38.1 C)] 98.9 F (37.2 C) (03/07 1140) Pulse Rate:  [80-99] 80 (03/07 0750) Resp:  [22-26] 25 (03/07 0750) BP: (105-119)/(59-65) 105/59 mmHg (03/07 0750) SpO2:  [89 %-100 %] 92 % (03/07 1210) FiO2 (%):  [45 %] 45 % (03/07 1210) Weight:  [60.873 kg (134 lb 3.2 oz)] 60.873 kg (134 lb 3.2 oz) (03/07 0424)   HEMODYNAMICS:   VENTILATOR SETTINGS: Vent Mode:  [-]  FiO2 (%):  [45 %] 45 % INTAKE / OUTPUT: Intake/Output      03/06 0701 - 03/07 0700 03/07 0701 - 03/08 0700   P.O.  120   I.V. (mL/kg) 2225 (36.6) 450 (7.4)   IV Piggyback 100    Total Intake(mL/kg) 2325 (38.2) 570 (9.4)   Urine (mL/kg/hr) 1325 (0.9) 375 (1.1)   Stool 0 (0)    Total Output 1325 375   Net +1000 +195        Stool  Occurrence 1 x      PHYSICAL EXAMINATION: General: Frail adult male, in NAD. Intermittent cough.  Neuro: Awake A&O x 3, No focal neuro deficits.  HEENT: PERRL, EOMI, sclerae anicteric. Cardiovascular: RRR, no M/R/G.  Lungs: Respirations even and unlabored.  Coarse crackles RLL, Bibasilar dullness Abdomen: BS x 4, soft, NT/ND.  Musculoskeletal: No gross deformities, no edema.  Skin: Intact, warm, no rashes.  LABS:  CBC  Recent Labs Lab 06/22/14 0312 06/23/14 0303 06/25/14 0405  WBC 13.0* 12.5* 12.2*  HGB 9.4* 9.2* 9.2*  HCT 27.8* 26.3* 26.5*  PLT 380 368 366   BMET  Recent Labs Lab 06/22/14 0312 06/23/14 0303 06/25/14 0405  NA 127* 127* 128*  K 3.7 3.3* 3.2*  CL 102 100 100  CO2 20 23 24   BUN 6 <5* <5*  CREATININE 0.57 0.58 0.50  GLUCOSE 107* 93 112*   Electrolytes  Recent Labs Lab 06/21/14 0235 06/22/14 0312 06/23/14 0303 06/25/14 0405  CALCIUM 6.6* 6.8* 6.7* 6.7*  MG 1.8 2.0  --  1.8  PHOS 2.6  --   --  2.6   Sepsis Markers  Recent Labs Lab 06/20/14 1443 06/20/14 1600 06/22/14 0921  LATICACIDVEN 2.07* 2.2* 1.6    Liver Enzymes  Recent Labs Lab 06/20/14 1337 06/23/14 0303 06/25/14 0405  AST 30 28 31   ALT  28 21 23   ALKPHOS 75 52 54  BILITOT 0.9 0.6 0.4  ALBUMIN 1.6* 1.2* 1.2*    Cardiac Enzymes  Recent Labs Lab 06/20/14 1600  TROPONINI <0.03   Glucose  Recent Labs Lab 06/24/14 0751 06/24/14 1222 06/24/14 1609 06/24/14 2116 06/25/14 0753 06/25/14 1137  GLUCAP 96 103* 123* 99 132* 125*    Imaging No results found. CT chest- 3/5- I reviewed images and agree with interpretation. Bibasilar pneumonia and effusions, R hilar mass appears to involve bronchus intermedius and pericardium.  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 He is very weak, but within a few days he may be strong enough to tolerate bronch seeking endobronchial tumor or possible TBNA in R mainstem.  Concern that tumor in pericardium may lead to tamponade. Consider thoracentesis R for decompression, cytology and cultures, but may only be inflammatory. Sputum cx non-diagnostic  PULMONARY A: HCAP rt base, aspiration? COPD by CXR - has never had PFT's Tobacco use disorder P:   Pulmonary hygiene. Continue tobacco cessation efforts. Abx - now on Zosyn Continue bronchodilators for now Entirely too hypoxic for bronchoscopy, will end up on the ventilator if sedated for a bronch.  I discussed this at length with him and his wife.  He wishes for a diagnosis and treatment if needed.  Will perform a thora later on today and await diagnosis.  CARDIOVASCULAR A:  Shock- hypovolemic (diarrhea, poor po intake) + early sepsis 2/2 HCAP PVD s/p stent placement to right external iliac artery 05/15/14 (Dr. Donnetta Hutching) P:  Per primary team. Don't see significant pericardial effusion yet.  RENAL A:   Hyponatremia Hypokalemia Mild Non-AG Acidosis; likely 2/2 volume repletion  P:   Saline maintenance Replace K as needed  GASTROINTESTINAL A:   Protein calorie malnutrition Nutrition malnurished Diarrhea resolved? at risk mesenteric ischemia, has no abdo pain P:   Per primary team  HEMATOLOGIC A:   Anemia; chronic disease vs dilutional Leukocytosis persists VTE Prophylaxis P:  Transfuse for Hgb < 7. SCD's / Heparin [place] Will need to stop anticoagulation for invasive procedures.  INFECTIOUS A:   HCAP; BLL infiltrate on CXR R/o C. Diff P:   BCx2 3/2 > neg UCx 3/2 > Sputum Cx 3/2 > C. Diff PCR 3/2 > Abx: Vanc, start date 3/2  Abx: Levaquin, start date 3/2 >>>3/3 Zosyn 3/4>>  Global- 3/6- Large R hilar mass, pneumonia c/w post-obstructive, bilateral effusions. Day 2 Zosyn. Consider bronch for tissue dx.  Plan on thora today, continue abx, no bronch, will send fluid for cytology, hopefully will be diagnostic.  Rush Farmer, M.D. [facility] Pulmonary/Critical Care Medicine. Pager:  [phone]. After hours pager: [phone].  06/25/2014 12:35 PM

## 2014-06-25 NOTE — Progress Notes (Signed)
[facility] TEAM 1 - Stepdown/ICU TEAM Progress Note  Dustin Freeman [id number]:[id number] DOB: 1955-08-15 DOA: 06/20/2014 PCP: No PCP Per Patient  Admit HPI / Brief Narrative: 59 y/o M w/ Hx of DM type II and PVD s/p stent placement right external iliac artery, brought to [facility] ED 3/2 for dizziness, cough, diarrhea. Hospitalized 3 weeks prior for syncope and underwent stent placement in occluded right external iliac artery. Ever since being discharged, has had diarrhea. In ED, CXR c/w RLL consolidation and patient remained hypotensive despite 4L IVF. PCCM consulted for admission.  Significant Events: 1/24 - 1/27 - admitted for syncope - found to have PVD with RLE claudication due to occluded right external iliac artery - underwent stent placement 3/2 - admit with ?HCAP  HPI/Subjective: No new complaints today.  Resting comfortably on supplemental O2.    Assessment/Plan:  Extensive RLL, RML, and partial RUL postobustructive PNA - LLL infiltrate Cont emperic broad coverage - CT chest ordered due to concern this could be a postobstructive process (CXRs at prior admit raised concern of R hilar enlargement) confirmed large R hilar mass - Pulm has seen and plan is now for thora w/ cytology as pt not felt to be stable enough for bronch - pt has been informed that this is almost definitely CA  Large soft tissue mass in the right hilum  obstructs the bronchus intermedius - PCCM following - see discussion above   Hypovolemic Shock - Sepsis due to PNA - resolved Cortisol normal - BP has now stabilized - shock resolved - sepsis physiology much improved  Hyponatremia  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 Donnetta Hutching) Foot is warm w/ pulse - pt denies complaints - cont Plavix   Severe malnutrition in the context of chronic illness Albumin 1.2 - nutrition consulted   Code Status: FULL Family Communication: no family present at time of exam Disposition Plan: SDU  Consultants: PCCM > TRH  Procedures: 3/7 thoracentesis - pending   Antibiotics: Cefepime 3/2 > 3/3 Levaquin 3/2 Vanc 3/2 > 3/4 Zosyn 3/4 >  DVT prophylaxis: SQ heparin   Objective: Blood pressure 105/59, pulse 80, temperature 98.9 F (37.2 C), temperature source Oral, resp. rate 25, height 5\' 9"  (1.753 m), weight 60.873 kg (134 lb 3.2 oz), SpO2 92 %.  Intake/Output Summary (Last 24 hours) at 06/25/14 1253 Last data filed at 06/25/14 1200  Gross per 24 hour  Intake   2345 ml  Output   1200 ml  Net   1145 ml   Exam: General: No acute respiratory distress evident - alert Lungs: R basilar crackles - no wheeze  Cardiovascular: Regular rate and rhythm  Abdomen: Nontender, nondistended, soft, bowel sounds positive, no rebound, no ascites, no appreciable mass Extremities: No significant cyanosis, clubbing, or edema bilateral lower extremities  Data Reviewed: Basic Metabolic Panel:  Recent Labs Lab 06/20/14 1337 06/21/14 0235 06/22/14 0312 06/23/14 0303 06/25/14 0405  NA 129* 129* 127* 127* 128*  K 4.6 3.9  3.7 3.3* 3.2*  CL 102 108 102 100 100  CO2 21 17* 20 23 24   GLUCOSE 106* 87 107* 93 112*  BUN 9 6 6  <5* <5*  CREATININE 0.63 0.54 0.57 0.58 0.50  CALCIUM 6.8* 6.6* 6.8* 6.7* 6.7*  MG  --  1.8 2.0  --  1.8  PHOS  --  2.6  --   --  2.6    Liver Function Tests:  Recent Labs Lab 06/20/14 1337 06/23/14 0303 06/25/14 0405  AST 30 28 31   ALT 28 21 23   ALKPHOS 75 52 54  BILITOT 0.9 0.6 0.4  PROT 5.0* 4.0* 4.4*  ALBUMIN 1.6* 1.2*  1.2*    Recent Labs Lab 06/20/14 1337 06/21/14 1400  LIPASE 29 21  AMYLASE  --  50   CBC:  Recent Labs Lab 06/20/14 1337 06/21/14 0235 06/22/14 0312 06/23/14 0303 06/25/14 0405  WBC 14.1* 12.5* 13.0* 12.5* 12.2*  NEUTROABS 12.3*  --  10.8*  --   --   HGB 10.1* 9.6* 9.4* 9.2* 9.2*  HCT 30.4* 28.5* 27.8* 26.3* 26.5*  MCV 87.1 87.4 87.7 84.6 84.4  PLT 417* 394 380 368 366   Cardiac Enzymes:  Recent Labs Lab 06/20/14 1600  TROPONINI <0.03   CBG:  Recent Labs Lab 06/24/14 1222 06/24/14 1609 06/24/14 2116 06/25/14 0753 06/25/14 1137  GLUCAP 103* 123* 99 132* 125*    Recent Results (from the past 240 hour(s))  Blood Culture (routine x 2)     Status: None (Preliminary result)   Collection Time: 06/20/14  1:49 PM  Result Value Ref Range Status   Specimen Description BLOOD ARM RIGHT  Final   Special Requests BOTTLES DRAWN AEROBIC AND ANAEROBIC 5CC  Final   Culture   Final           BLOOD CULTURE RECEIVED NO GROWTH TO DATE CULTURE WILL BE HELD FOR 5 DAYS BEFORE ISSUING A FINAL NEGATIVE REPORT Performed at [facility]    Report Status PENDING  Incomplete  Urine culture     Status: None   Collection Time: 06/20/14  2:12 PM  Result Value Ref Range Status   Specimen Description URINE, RANDOM  Final   Special Requests NONE  Final   Colony Count NO GROWTH Performed at [facility]   Final   Culture NO GROWTH Performed at [facility]   Final   Report Status 06/21/2014 FINAL  Final  Blood Culture (routine x 2)     Status: None (Preliminary result)   Collection Time: 06/20/14  2:49 PM  Result Value Ref Range Status   Specimen Description BLOOD RIGHT FOREARM  Final   Special Requests BOTTLES DRAWN AEROBIC AND ANAEROBIC 4CC  Final   Culture   Final           BLOOD CULTURE RECEIVED NO GROWTH TO DATE CULTURE WILL BE HELD FOR 5 DAYS BEFORE ISSUING A FINAL NEGATIVE REPORT Performed at [facility]    Report Status PENDING   Incomplete  MRSA PCR Screening     Status: None   Collection Time: 06/20/14  5:20 PM  Result Value Ref Range Status   MRSA by PCR NEGATIVE NEGATIVE Final    Comment:        The GeneXpert MRSA Assay (FDA approved for NASAL specimens only), is one component of a comprehensive MRSA colonization surveillance program. It is not intended to diagnose MRSA infection nor to guide or monitor treatment for MRSA infections.  Culture, expectorated sputum-assessment     Status: None   Collection Time: 06/21/14  3:39 PM  Result Value Ref Range Status   Specimen Description SPUTUM  Final   Special Requests NONE  Final   Sputum evaluation   Final    MICROSCOPIC FINDINGS SUGGEST THAT THIS SPECIMEN IS NOT REPRESENTATIVE OF LOWER RESPIRATORY SECRETIONS. PLEASE RECOLLECT. Avis Epley RN [phone] 06/21/14 A BROWNING    Report Status 06/21/2014 FINAL  Final     Studies:  Recent x-ray studies have been reviewed in detail by the Attending Physician  Scheduled Meds:  Scheduled Meds: . antiseptic oral rinse  7 mL Mouth Rinse q12n4p  . chlorhexidine  15 mL Mouth Rinse BID  . clopidogrel  75 mg Oral Daily  . feeding supplement (GLUCERNA SHAKE)  237 mL Oral TID BM  . heparin  5,000 Units Subcutaneous 3 times per day  . insulin aspart  0-9 Units Subcutaneous TID WC  . ipratropium-albuterol  3 mL Nebulization QID  . multivitamin with minerals  1 tablet Oral Daily  . piperacillin-tazobactam (ZOSYN)  IV  3.375 g Intravenous Q8H    Time spent on care of this patient: 25 mins   [facility] T , MD   [facility] Office  [phone] Pager - Text Page per Shea Evans as per below:  On-Call/Text Page:      [email or web address]      password [email or web address]  If 7PM-7AM, please contact night-coverage [email or web address] Password [email or web address] 06/25/2014, 12:53 PM   LOS: 5 days

## 2014-06-25 NOTE — Progress Notes (Signed)
UR COMPLETED  

## 2014-06-25 NOTE — Clinical Social Work Note (Signed)
CSW received handoff requesting providing homeless resources to the pt. CSW met the pt the pt at the bedside. CSW introduce self and purpose of the visit. Pt reported that he receive services through [facility]. Pt reported that [facility] is aiding him and his wife with housing placement. CSW spoke with Melody at [phone], she reported that she does not know which case manager the pt is assigned. Melody reported that the case managers are on spring break and will not be back until Monday 14th. Melody reported having an on-site clinic which can aid in filling some of the pt's medications. CSW will continue to follow and provided support to the pt.   [place], MSW, [place]

## 2014-06-26 DIAGNOSIS — E876 Hypokalemia: Secondary | ICD-10-CM | POA: Diagnosis present

## 2014-06-26 DIAGNOSIS — R739 Hyperglycemia, unspecified: Secondary | ICD-10-CM

## 2014-06-26 DIAGNOSIS — R571 Hypovolemic shock: Secondary | ICD-10-CM | POA: Diagnosis present

## 2014-06-26 DIAGNOSIS — I9589 Other hypotension: Secondary | ICD-10-CM | POA: Diagnosis present

## 2014-06-26 DIAGNOSIS — J431 Panlobular emphysema: Secondary | ICD-10-CM | POA: Diagnosis present

## 2014-06-26 DIAGNOSIS — E871 Hypo-osmolality and hyponatremia: Secondary | ICD-10-CM | POA: Diagnosis present

## 2014-06-26 DIAGNOSIS — I509 Heart failure, unspecified: Secondary | ICD-10-CM

## 2014-06-26 DIAGNOSIS — J189 Pneumonia, unspecified organism: Secondary | ICD-10-CM | POA: Diagnosis present

## 2014-06-26 DIAGNOSIS — R918 Other nonspecific abnormal finding of lung field: Secondary | ICD-10-CM | POA: Diagnosis present

## 2014-06-26 DIAGNOSIS — E43 Unspecified severe protein-calorie malnutrition: Secondary | ICD-10-CM

## 2014-06-26 LAB — COMPREHENSIVE METABOLIC PANEL
ALBUMIN: 1.2 g/dL — AB (ref 3.5–5.2)
ALT: 36 U/L (ref 0–53)
AST: 52 U/L — AB (ref 0–37)
Alkaline Phosphatase: 51 U/L (ref 39–117)
Anion gap: 3 — ABNORMAL LOW (ref 5–15)
BUN: 5 mg/dL — ABNORMAL LOW (ref 6–23)
CALCIUM: 7.1 mg/dL — AB (ref 8.4–10.5)
CO2: 24 mmol/L (ref 19–32)
Chloride: 103 mmol/L (ref 96–112)
Creatinine, Ser: 0.54 mg/dL (ref 0.50–1.35)
GFR calc Af Amer: >90 mL/min (ref >90)
Glucose, Bld: 95 mg/dL (ref 70–99)
Potassium: 4 mmol/L (ref 3.5–5.1)
SODIUM: 130 mmol/L — AB (ref 135–145)
Total Bilirubin: 0.6 mg/dL (ref 0.3–1.2)
Total Protein: 4.2 g/dL — ABNORMAL LOW (ref 6.0–8.3)

## 2014-06-26 LAB — GLUCOSE, CAPILLARY
GLUCOSE-CAPILLARY: 159 mg/dL — AB (ref 70–99)
GLUCOSE-CAPILLARY: 112 mg/dL — AB (ref 70–99)
Glucose-Capillary: 149 mg/dL — ABNORMAL HIGH (ref 70–99)
Glucose-Capillary: 148 mg/dL — ABNORMAL HIGH (ref 70–99)

## 2014-06-26 LAB — CBC WITH DIFFERENTIAL/PLATELET
BASOS PCT: 0 % (ref 0–1)
Basophils Absolute: 0 10*3/uL (ref 0.0–0.1)
EOS PCT: 2 % (ref 0–5)
Eosinophils Absolute: 0.1 10*3/uL (ref 0.0–0.7)
HEMATOCRIT: 26.3 % — AB (ref 39.0–52.0)
Hemoglobin: 9 g/dL — ABNORMAL LOW (ref 13.0–17.0)
Lymphocytes Relative: 15 % (ref 12–46)
Lymphs Abs: 1.2 10*3/uL (ref 0.7–4.0)
MCH: 29.5 pg (ref 26.0–34.0)
MCHC: 34.2 g/dL (ref 30.0–36.0)
MCV: 86.2 fL (ref 78.0–100.0)
MONO ABS: 0.4 10*3/uL (ref 0.1–1.0)
Monocytes Relative: 5 % (ref 3–12)
Neutro Abs: 6.3 10*3/uL (ref 1.7–7.7)
Neutrophils Relative %: 78 % — ABNORMAL HIGH (ref 43–77)
Platelets: 307 10*3/uL (ref 150–400)
RBC: 3.05 MIL/uL — ABNORMAL LOW (ref 4.22–5.81)
RDW: 14.8 % (ref 11.5–15.5)
WBC: 8 10*3/uL (ref 4.0–10.5)

## 2014-06-26 LAB — BLOOD GAS, ARTERIAL
Acid-Base Excess: 0.3 mmol/L (ref 0.0–2.0)
BICARBONATE: 23.5 meq/L (ref 20.0–24.0)
Drawn by: 205171
O2 CONTENT: 6 L/min
O2 SAT: 91.7 %
PATIENT TEMPERATURE: 98.6
TCO2: 24.5 mmol/L (ref 0–100)
pCO2 arterial: 32.3 mmHg — ABNORMAL LOW (ref 35.0–45.0)
pH, Arterial: 7.475 — ABNORMAL HIGH (ref 7.350–7.450)
pO2, Arterial: 64.2 mmHg — ABNORMAL LOW (ref 80.0–100.0)

## 2014-06-26 LAB — CULTURE, BLOOD (ROUTINE X 2)
Culture: NO GROWTH
Culture: NO GROWTH

## 2014-06-26 LAB — FUNGAL STAIN
Fungal Smear: NONE SEEN
Special Requests: NORMAL

## 2014-06-26 LAB — PHOSPHORUS: Phosphorus: 2.3 mg/dL (ref 2.3–4.6)

## 2014-06-26 MED ORDER — ALBUMIN HUMAN 25 % IV SOLN
75.0000 g | Freq: Once | INTRAVENOUS | Status: AC
  Administered 2014-06-26: 75 g via INTRAVENOUS
  Filled 2014-06-26: qty 300

## 2014-06-26 MED ORDER — HYDROCORTISONE NA SUCCINATE PF 100 MG IJ SOLR
100.0000 mg | Freq: Three times a day (TID) | INTRAMUSCULAR | Status: DC
  Filled 2014-06-26 (×2): qty 2

## 2014-06-26 MED ORDER — HYDROCORTISONE NA SUCCINATE PF 100 MG IJ SOLR
100.0000 mg | Freq: Three times a day (TID) | INTRAMUSCULAR | Status: DC
  Administered 2014-06-26 – 2014-06-30 (×12): 100 mg via INTRAVENOUS
  Filled 2014-06-26 (×14): qty 2

## 2014-06-26 MED ORDER — INSULIN ASPART 100 UNIT/ML ~~LOC~~ SOLN: 0.0000 [IU] | SUBCUTANEOUS | Status: DC

## 2014-06-26 MED ORDER — INSULIN ASPART 100 UNIT/ML ~~LOC~~ SOLN
0.0000 [IU] | SUBCUTANEOUS | Status: DC
  Administered 2014-06-26 – 2014-06-27 (×2): 2 [IU] via SUBCUTANEOUS
  Administered 2014-06-27: 3 [IU] via SUBCUTANEOUS
  Administered 2014-06-27: 2 [IU] via SUBCUTANEOUS

## 2014-06-26 NOTE — Progress Notes (Signed)
  Echocardiogram 2D Echocardiogram has been performed.  Dustin Freeman 06/26/2014, 3:25 PM

## 2014-06-26 NOTE — Progress Notes (Signed)
PULMONARY / CRITICAL CARE MEDICINE   Name: Dustin Freeman MRN: [id number] DOB: Sep 18, 1955    ADMISSION DATE:  06/20/2014 CONSULTATION DATE:  06/26/2014  REFERRING MD :  EDP  CHIEF COMPLAINT:  Dizziness, cough, diarrhea  INITIAL PRESENTATION:  59 y/o M w/ PMHx of DM type II and PVD s/p stent placement of right external iliac artery, brought to [facility] ED 3/2 for dizziness, cough, diarrhea. Hospitalized 3 weeks ago for syncope and underwent stent placement in occluded right external iliac artery at that time.  Ever since being discharged, has had diarrhea.  In ED, CXR c/w RLL consolidation and patient remained hypotensive despite 4L IVF.  PCCM consulted for admission.  Asked to see patient 3/5 because CT chest shows R hilar mass involving Bronchus Intermedius and pericardium. Biopsy consideration requested.  STUDIES:  CXR 3/2 >>> RLL consolidation CT chest 06/22/14  SIGNIFICANT EVENTS: 1/24 - 1/27 - admitted for syncope.  Found to have PVD with RLE claudication due to occluded right external iliac artery.  Underwent stent placement 3/2 - admit with HCAP 3/4- CT R hilar mass, pneumonia, bilat effusion  SUBJECTIVE:  Cough only producing clear sputum.  VITAL SIGNS: Temp:  [97.3 F (36.3 C)-98.9 F (37.2 C)] 98 F (36.7 C) (03/08 0752) Pulse Rate:  [73-100] 89 (03/08 0752) Resp:  [19-30] 25 (03/08 0752) BP: (100-121)/(59-93) 102/68 mmHg (03/08 0752) SpO2:  [92 %-97 %] 96 % (03/08 0857) FiO2 (%):  [45 %] 45 % (03/08 0857) Weight:  [60.8 kg (134 lb 0.6 oz)] 60.8 kg (134 lb 0.6 oz) (03/08 0500)   HEMODYNAMICS:   VENTILATOR SETTINGS: Vent Mode:  [-]  FiO2 (%):  [45 %] 45 % INTAKE / OUTPUT: Intake/Output      03/07 0701 - 03/08 0700 03/08 0701 - 03/09 0700   P.O. 480    I.V. (mL/kg) 1563.3 (25.7) 50 (0.8)   IV Piggyback     Total Intake(mL/kg) 2043.3 (33.6) 50 (0.8)   Urine (mL/kg/hr) 1825 (1.3)    Stool     Total Output 1825     Net +218.3 +50          PHYSICAL  EXAMINATION: General: Frail adult male, in NAD. Intermittent cough.  Neuro: Awake A&O x 3, No focal neuro deficits.  HEENT: PERRL, EOMI, sclerae anicteric. Cardiovascular: RRR, no M/R/G.  Lungs: Respirations even and unlabored.  Coarse crackles RLL, Bibasilar dullness Abdomen: BS x 4, soft, NT/ND.  Musculoskeletal: No gross deformities, no edema.  Skin: Intact, warm, no rashes.  LABS:  CBC  Recent Labs Lab 06/22/14 0312 06/23/14 0303 06/25/14 0405  WBC 13.0* 12.5* 12.2*  HGB 9.4* 9.2* 9.2*  HCT 27.8* 26.3* 26.5*  PLT 380 368 366   BMET  Recent Labs Lab 06/23/14 0303 06/25/14 0405 06/26/14 0254  NA 127* 128* 130*  K 3.3* 3.2* 4.0  CL 100 100 103  CO2 23 24 24   BUN <5* <5* 5*  CREATININE 0.58 0.50 0.54  GLUCOSE 93 112* 95   Electrolytes  Recent Labs Lab 06/21/14 0235 06/22/14 0312 06/23/14 0303 06/25/14 0405 06/26/14 0254  CALCIUM 6.6* 6.8* 6.7* 6.7* 7.1*  MG 1.8 2.0  --  1.8  --   PHOS 2.6  --   --  2.6  --    Sepsis Markers  Recent Labs Lab 06/20/14 1443 06/20/14 1600 06/22/14 0921  LATICACIDVEN 2.07* 2.2* 1.6    Liver Enzymes  Recent Labs Lab 06/23/14 0303 06/25/14 0405 06/26/14 0254  AST 28 31 52*  ALT 21 23 36  ALKPHOS 52 54 51  BILITOT 0.6 0.4 0.6  ALBUMIN 1.2* 1.2* 1.2*    Cardiac Enzymes  Recent Labs Lab 06/20/14 1600  TROPONINI <0.03   Glucose  Recent Labs Lab 06/24/14 1609 06/24/14 2116 06/25/14 0753 06/25/14 1137 06/25/14 1657 06/25/14 2141  GLUCAP 123* 99 132* 125* 95 110*    Imaging Dg Chest Port 1 View  06/25/2014   CLINICAL DATA:  Post thoracentesis.  EXAM: PORTABLE CHEST - 1 VIEW  COMPARISON:  06/21/2014  FINDINGS: Bilateral lower lobe airspace opacities are noted, increasing on the left since prior study. Suspect layering effusions. , increasing on the left as well since prior study. No pneumothorax. There is a lying superimposed over the left upper lobe which simulates a pneumothorax, but lung markings  are noted lateral to this line compatible with artifact.  Heart is borderline in size.  No acute bony abnormality.  IMPRESSION: Bilateral lower lobe airspace opacities and layering effusions, both worsening on the left since prior study. No pneumothorax.   Electronically Signed   By: Rolm Baptise M.D.   On: 06/25/2014 15:51   CT chest- 3/5- I reviewed images and agree with interpretation. Bibasilar pneumonia and effusions, R hilar mass appears to involve bronchus intermedius and pericardium.  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 He is very weak, but within a few days he may be strong enough to tolerate bronch seeking endobronchial tumor or possible TBNA in R mainstem. Concern that tumor in pericardium may lead to tamponade. Consider thoracentesis R for decompression, cytology and cultures, but may only be inflammatory. Sputum cx non-diagnostic  PULMONARY A: HCAP rt base, aspiration? COPD by CXR - has never had PFT's Tobacco use disorder Thora done 3/7 with >1 L of fluid removed, appears borderline (LDH ratio of 0.65). P:   Pulmonary hygiene. Continue tobacco cessation efforts. Abx - now on Zosyn Continue bronchodilators for now Hypoxemia improving post thora but not stable enough for bronchoscopy.  Will await results of thora, if negative cytology then will need to reconsider bronchoscopy, too unsafe to perform right now due to high O2 demand.  CARDIOVASCULAR A:  Shock- hypovolemic (diarrhea, poor po intake) + early sepsis 2/2 HCAP PVD s/p stent placement to right external iliac artery 05/15/14 (Dr. Donnetta Hutching) P:  Per primary team. Don't see significant pericardial effusion yet.  RENAL A:   Hyponatremia Hypokalemia Mild Non-AG Acidosis; likely 2/2 volume repletion  P:   Saline maintenance Replace K as needed  GASTROINTESTINAL A:   Protein calorie malnutrition Nutrition malnurished Diarrhea resolved? at risk mesenteric  ischemia, has no abdo pain P:   Per primary team  HEMATOLOGIC A:   Anemia; chronic disease vs dilutional Leukocytosis persists VTE Prophylaxis P:  Transfuse for Hgb < 7. SCD's / Heparin [place] May restart [place] heparin.  INFECTIOUS A:   HCAP; BLL infiltrate on CXR R/o C. Diff P:   BCx2 3/2 > neg UCx 3/2 > Sputum Cx 3/2 > C. Diff PCR 3/2 > Abx: Vanc, start date 3/2  Abx: Levaquin, start date 3/2 >>>3/3 Zosyn 3/4>>  Will continue to follow for cytology.  Rush Farmer, M.D. [facility] Pulmonary/Critical Care Medicine. Pager: [phone]. After hours pager: [phone].  06/26/2014 10:20 AM

## 2014-06-26 NOTE — Progress Notes (Signed)
Dustin Freeman TEAM 1 - Stepdown/ICU TEAM Progress Note  Dustin Freeman ZDG:[id number] DOB: Feb 09, 1956 DOA: 06/20/2014 PCP: No PCP Per Patient  Admit HPI / Brief Narrative: 59 y/o WM  PMHx of DM type II and PVD s/p stent placement right external iliac artery, brought to [facility] ED 3/2 for dizziness, cough, diarrhea. Hospitalized 3 weeks prior for syncope and underwent stent placement in occluded right external iliac artery. Ever since being discharged, has had diarrhea. In ED, CXR c/w RLL consolidation and patient remained hypotensive despite 4L IVF. PCCM consulted for admission.   HPI/Subjective: 3/8 A/O 4, states feels that he can breathe better. States positive smoking for 48 years stopped~6 months ago.    Assessment/Plan:  Extensive RLL, RML, and partial RUL postobustructive PNA - LLL infiltrate -Cont emperic broad coverage  - CT chest confirmed large Rt hilar mass  - Pulm has seen; plan for thora w/ cytology when patient more stable for bronchoscopy  - pt has been informed that this is almost definitely CA  Large soft tissue mass in the right hilum  -obstructs the bronchus intermedius - PCCM following - see discussion above   Hypovolemic Shock - Sepsis due to PNA - resolved -Cortisol normal  -Start stress dose steroids 100 mg TID  Hypotension -Most likely multifactorial to include sepsis, poor nutrition with large volume removal on thoracentesis, neoplasm? 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 If truly SIADH with hydration hyponatremia should worsen. 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 Donnetta Hutching) Foot is warm w/ pulse - pt denies complaints - cont Plavix   Severe malnutrition in the context of chronic illness Albumin 1.2 - nutrition consulted    Code Status: FULL Family Communication: no family present at time of exam Disposition Plan: Per pulmonology/oncology. Palliative care?    Consultants: Dr. Rush Farmer (PCCM )   Procedure/Significant Events: 1/24 - 1/27 - admitted for syncope. Found to have PVD with RLE claudication due to occluded right external iliac artery. Underwent stent placement 3/4- CT chest with contrast; Rt hilar mass, -Severe post obstructive pneumonia RLL with extension RML/RUL. -Large soft tissue mass in the right hilum obstructs the bronchus intermedius. -bilateral pleural effusions.-Infiltrate LLL 3/7 Right thoracentesis 1.2 L removed   Culture BCx2 3/2 > neg UCx 3/2 > negative Sputum Cx 3/2 > not adequate specimen C. Diff PCR 3/2 >\ 3/7 pleural fluid pending    Antibiotics: Vancomycin 3/2>> stopped 3/5 Levaquin, start date 3/2 >>> stopped 3/3 Zosyn 3/4>>  DVT prophylaxis: Subcutaneous heparin   Devices NA   LINES / TUBES:  NA    Continuous Infusions: . sodium chloride 50 mL/hr at 06/26/14 1723    Objective: VITAL SIGNS: Temp: 98.6 F (37 C) (03/08 1939) Temp Source: Oral (03/08 1939) BP: 104/58 mmHg (03/08 1556) Pulse Rate: 67 (03/08 1556) SPO2; 93% on Venturi mask FIO2: 10  L/min   Intake/Output Summary (Last 24 hours) at 06/26/14 2053 Last data filed at 06/26/14 1942  Gross per 24 hour  Intake   2325 ml  Output   2750 ml  Net   -425 ml     Exam: General: A/O 4, acute respiratory distress still requiring Venturi mask to maintain SPO2 in the low  90s Lungs: poor air movement diffusely. No breath sounds RLL, negative wheezes or crackles Cardiovascular: Regular rate and rhythm without murmur gallop or rub normal S1 and S2 Abdomen: Nontender, nondistended, soft, bowel sounds positive, no rebound, no ascites, no appreciable mass Extremities: No significant cyanosis, clubbing, or edema bilateral lower extremities  Data Reviewed: Basic Metabolic Panel:  Recent Labs Lab 06/21/14 0235 06/22/14 0312 06/23/14 0303 06/25/14 0405 06/26/14 0254  NA 129* 127* 127* 128* 130*  K 3.9 3.7 3.3* 3.2* 4.0  CL 108 102 100 100 103  CO2 17* 20 23 24 24   GLUCOSE 87 107* 93 112* 95  BUN 6 6 <5* <5* 5*  CREATININE 0.54 0.57 0.58 0.50 0.54  CALCIUM 6.6* 6.8* 6.7* 6.7* 7.1*  MG 1.8 2.0  --  1.8  --   PHOS 2.6  --   --  2.6  --    Liver Function Tests:  Recent Labs Lab 06/20/14 1337 06/23/14 0303 06/25/14 0405 06/25/14 1708 06/26/14 0254  AST 30 28 31   --  52*  ALT 28 21 23   --  36  ALKPHOS 75 52 54  --  51  BILITOT 0.9 0.6 0.4  --  0.6  PROT 5.0* 4.0* 4.4* 4.6* 4.2*  ALBUMIN 1.6* 1.2* 1.2*  --  1.2*    Recent Labs Lab 06/20/14 1337 06/21/14 1400  LIPASE 29 21  AMYLASE  --  50   No results for input(s): AMMONIA in the last 168 hours. CBC:  Recent Labs Lab 06/20/14 1337 06/21/14 0235 06/22/14 0312 06/23/14 0303 06/25/14 0405 06/26/14 1504  WBC 14.1* 12.5* 13.0* 12.5* 12.2* 8.0  NEUTROABS 12.3*  --  10.8*  --   --  6.3  HGB 10.1* 9.6* 9.4* 9.2* 9.2* 9.0*  HCT 30.4* 28.5* 27.8* 26.3* 26.5* 26.3*  MCV 87.1 87.4 87.7 84.6 84.4 86.2  PLT 417* 394 380 368 366 307   Cardiac Enzymes:  Recent Labs Lab 06/20/14 1600  TROPONINI <0.03   BNP (last 3 results) No results for input(s): BNP in the last 8760 hours.  ProBNP (last 3 results) No results for input(s): PROBNP in the last 8760 hours.  CBG:  Recent Labs Lab 06/25/14 1657 06/25/14 2141 06/26/14 0811 06/26/14 1223 06/26/14 1700  GLUCAP 95 110* 149* 159* 112*     Recent Results (from the past 240 hour(s))  Blood Culture (routine x 2)     Status: None   Collection Time: 06/20/14  1:49 PM  Result Value Ref Range Status   Specimen Description BLOOD ARM RIGHT  Final   Special Requests BOTTLES DRAWN AEROBIC AND ANAEROBIC 5CC  Final   Culture   Final    NO GROWTH 5 DAYS Performed at [facility]    Report Status 06/26/2014 FINAL  Final  Urine culture     Status: None   Collection Time: 06/20/14  2:12 PM  Result Value Ref Range Status   Specimen Description URINE, RANDOM  Final   Special Requests NONE  Final   Colony Count NO GROWTH Performed at [facility]   Final   Culture NO GROWTH Performed at [facility]   Final   Report Status 06/21/2014 FINAL  Final  Blood Culture (routine x 2)     Status: None   Collection Time: 06/20/14  2:49 PM  Result Value Ref Range Status   Specimen Description BLOOD RIGHT FOREARM  Final   Special Requests BOTTLES DRAWN AEROBIC AND ANAEROBIC 4CC  Final   Culture   Final    NO GROWTH 5 DAYS Performed at [facility]    Report Status 06/26/2014 FINAL  Final  MRSA PCR Screening     Status: None   Collection Time: 06/20/14  5:20 PM  Result Value Ref Range Status   MRSA by PCR NEGATIVE NEGATIVE Final    Comment:        The GeneXpert MRSA Assay (FDA approved for NASAL specimens only), is one component of a comprehensive MRSA colonization surveillance program. It is not intended to diagnose MRSA infection nor to guide or monitor treatment for MRSA infections.   Culture, expectorated sputum-assessment     Status: None   Collection Time: 06/21/14  3:39 PM  Result Value Ref Range Status   Specimen Description SPUTUM  Final   Special Requests NONE  Final   Sputum evaluation   Final    MICROSCOPIC FINDINGS SUGGEST THAT THIS SPECIMEN IS NOT REPRESENTATIVE OF LOWER RESPIRATORY SECRETIONS. PLEASE RECOLLECT. Avis Epley RN [id number] 06/21/14 A BROWNING    Report Status  06/21/2014 FINAL  Final  Body fluid culture     Status: None (Preliminary result)   Collection Time: 06/25/14  3:15 PM  Result Value Ref Range Status   Specimen Description FLUID RIGHT PLEURAL  Final   Special Requests Normal  Final   Gram Stain   Final    FEW WBC PRESENT, PREDOMINANTLY PMN NO ORGANISMS SEEN Performed at [facility]    Culture NO GROWTH Performed at [facility]   Final   Report Status PENDING  Incomplete  AFB culture with smear     Status: None (Preliminary result)   Collection Time: 06/25/14  3:15 PM  Result Value Ref Range Status   Specimen Description FLUID RIGHT PLEURAL  Final   Special Requests Normal  Final   Acid Fast Smear   Final    NO ACID FAST BACILLI SEEN Performed at [facility]    Culture   Final    CULTURE WILL BE EXAMINED FOR 6 WEEKS BEFORE ISSUING A FINAL REPORT Performed at [facility]    Report Status PENDING  Incomplete  Fungal stain     Status: None   Collection Time: 06/25/14  3:15 PM  Result Value Ref Range Status   Specimen Description FLUID RIGHT PLEURAL  Final   Special Requests Normal  Final   Fungal Smear   Final    NO YEAST OR FUNGAL ELEMENTS SEEN Performed at [facility]    Report Status 06/26/2014 FINAL  Final     Studies:  Recent x-ray studies have been reviewed in detail by the Attending Physician  Scheduled Meds:  Scheduled Meds: . antiseptic oral rinse  7 mL Mouth Rinse q12n4p  . chlorhexidine  15 mL Mouth Rinse BID  . clopidogrel  75 mg Oral Daily  . feeding supplement (GLUCERNA SHAKE)  237 mL Oral TID BM  . heparin  5,000 Units Subcutaneous 3 times per day  . hydrocortisone sod succinate (SOLU-CORTEF) inj  100 mg Intravenous Q8H  . insulin aspart  0-15 Units Subcutaneous Q4H  .  ipratropium-albuterol  3 mL Nebulization QID  . multivitamin with minerals  1 tablet Oral Daily  . piperacillin-tazobactam (ZOSYN)  IV  3.375 g Intravenous Q8H  . potassium chloride  40  mEq Oral BID    Time spent on care of this patient: 40 mins   Allie Bossier [facility]  [facility] Office  [phone] Pager - [phone]  On-Call/Text Page:      [email or web address]      password [email or web address]  If 7PM-7AM, please contact night-coverage [email or web address] Password [email or web address] 06/26/2014, 8:53 PM   LOS: 6 days   Care during the described time interval was provided by me .  I have reviewed this patient's available data, including medical history, events of note, physical examination, radiology studies and test results as part of my evaluation  Dia Crawford, MD [phone] Pager

## 2014-06-26 NOTE — Progress Notes (Signed)
Called dr Sherral Hammers new orders rec'd to give albumin iv

## 2014-06-27 DIAGNOSIS — R918 Other nonspecific abnormal finding of lung field: Secondary | ICD-10-CM

## 2014-06-27 LAB — CBC WITH DIFFERENTIAL/PLATELET
BASOS PCT: 0 % (ref 0–1)
Basophils Absolute: 0 10*3/uL (ref 0.0–0.1)
EOS ABS: 0 10*3/uL (ref 0.0–0.7)
EOS PCT: 0 % (ref 0–5)
HCT: 27.4 % — ABNORMAL LOW (ref 39.0–52.0)
Hemoglobin: 9.2 g/dL — ABNORMAL LOW (ref 13.0–17.0)
LYMPHS ABS: 0.6 10*3/uL — AB (ref 0.7–4.0)
Lymphocytes Relative: 5 % — ABNORMAL LOW (ref 12–46)
MCH: 29 pg (ref 26.0–34.0)
MCHC: 33.6 g/dL (ref 30.0–36.0)
MCV: 86.4 fL (ref 78.0–100.0)
MONO ABS: 0.1 10*3/uL (ref 0.1–1.0)
MONOS PCT: 1 % — AB (ref 3–12)
Neutro Abs: 11.3 10*3/uL — ABNORMAL HIGH (ref 1.7–7.7)
Neutrophils Relative %: 94 % — ABNORMAL HIGH (ref 43–77)
PLATELETS: 309 10*3/uL (ref 150–400)
RBC: 3.17 MIL/uL — ABNORMAL LOW (ref 4.22–5.81)
RDW: 15 % (ref 11.5–15.5)
WBC: 12 10*3/uL — AB (ref 4.0–10.5)

## 2014-06-27 LAB — COMPREHENSIVE METABOLIC PANEL
ALT: 32 U/L (ref 0–53)
AST: 37 U/L (ref 0–37)
Albumin: 2.2 g/dL — ABNORMAL LOW (ref 3.5–5.2)
Alkaline Phosphatase: 52 U/L (ref 39–117)
Anion gap: 4 — ABNORMAL LOW (ref 5–15)
BUN: 7 mg/dL (ref 6–23)
CO2: 26 mmol/L (ref 19–32)
CREATININE: 0.52 mg/dL (ref 0.50–1.35)
Calcium: 7.8 mg/dL — ABNORMAL LOW (ref 8.4–10.5)
Chloride: 105 mmol/L (ref 96–112)
GLUCOSE: 124 mg/dL — AB (ref 70–99)
Potassium: 4.1 mmol/L (ref 3.5–5.1)
SODIUM: 135 mmol/L (ref 135–145)
TOTAL PROTEIN: 5 g/dL — AB (ref 6.0–8.3)
Total Bilirubin: 0.4 mg/dL (ref 0.3–1.2)

## 2014-06-27 LAB — GLUCOSE, CAPILLARY
GLUCOSE-CAPILLARY: 131 mg/dL — AB (ref 70–99)
Glucose-Capillary: 152 mg/dL — ABNORMAL HIGH (ref 70–99)
Glucose-Capillary: 133 mg/dL — ABNORMAL HIGH (ref 70–99)
Glucose-Capillary: 184 mg/dL — ABNORMAL HIGH (ref 70–99)
Glucose-Capillary: 127 mg/dL — ABNORMAL HIGH (ref 70–99)

## 2014-06-27 LAB — MAGNESIUM: MAGNESIUM: 2.1 mg/dL (ref 1.5–2.5)

## 2014-06-27 MED ORDER — INSULIN ASPART 100 UNIT/ML ~~LOC~~ SOLN
0.0000 [IU] | Freq: Three times a day (TID) | SUBCUTANEOUS | Status: DC
  Administered 2014-06-27: 3 [IU] via SUBCUTANEOUS
  Administered 2014-06-28: 2 [IU] via SUBCUTANEOUS
  Administered 2014-06-28 – 2014-06-29 (×2): 3 [IU] via SUBCUTANEOUS
  Administered 2014-06-29: 8 [IU] via SUBCUTANEOUS
  Administered 2014-06-30 – 2014-07-01 (×3): 2 [IU] via SUBCUTANEOUS
  Administered 2014-07-01 – 2014-07-03 (×2): 3 [IU] via SUBCUTANEOUS
  Administered 2014-07-04: 2 [IU] via SUBCUTANEOUS
  Administered 2014-07-04: 3 [IU] via SUBCUTANEOUS
  Administered 2014-07-04 – 2014-07-05 (×2): 2 [IU] via SUBCUTANEOUS
  Administered 2014-07-05 (×2): 3 [IU] via SUBCUTANEOUS
  Administered 2014-07-06 (×2): 2 [IU] via SUBCUTANEOUS
  Administered 2014-07-07: 3 [IU] via SUBCUTANEOUS
  Administered 2014-07-07: 2 [IU] via SUBCUTANEOUS

## 2014-06-27 MED ORDER — ENSURE COMPLETE PO LIQD
237.0000 mL | Freq: Two times a day (BID) | ORAL | Status: DC
  Administered 2014-06-28 – 2014-06-29 (×3): 237 mL via ORAL

## 2014-06-27 NOTE — Progress Notes (Signed)
[place] TEAM 1 - Stepdown/ICU TEAM Progress Note  Dustin Freeman NKN:[id number] DOB: 05/08/1955 DOA: 06/20/2014 PCP: No PCP Per Patient  Admit HPI / Brief Narrative: 59 y/o M w/ Hx of DM type II and PVD s/p stent placement right external iliac artery, brought to [facility] ED 3/2 for dizziness, cough, diarrhea. Hospitalized 3 weeks prior for syncope and underwent stent placement in occluded right external iliac artery. Ever since being discharged, has had diarrhea. In ED, CXR c/w RLL consolidation and patient remained hypotensive despite 4L IVF. PCCM consulted for admission.  Significant Events: 1/24 - 1/27 - admitted for syncope - found to have PVD with RLE claudication due to occluded right external iliac artery - underwent stent placement 3/2 - admit with ?HCAP  HPI/Subjective: No new complaints today.  Resting comfortably.    Assessment/Plan:  Extensive RLL, RML, and partial RUL postobustructive PNA - LLL infiltrate Cont emperic broad coverage - CT chest ordered due to concern this could be a postobstructive process (CXRs at prior admit raised concern of R hilar enlargement) confirmed large R hilar mass - Pulm performed thora but cytology not diagnostic - pt has been informed that this is almost definitely CA - plan is now for bronch in AM   Large soft tissue mass in the right hilum  obstructs the bronchus intermedius - PCCM following - see discussion above   Hypovolemic Shock - Sepsis due to PNA - resolved Cortisol normal - BP has now stabilized - shock resolved - sepsis physiology essentially resolved - thus far trial of stress dose steroids has had no appreciable effect on his SBP   Hyponatremia  Possible element of SIADH, but agree w/ Dr. Sherral Hammers that fluid restriction not appropriate - Na has improved w/ volume which does indeed argue against SIADH   Mild hypokalemia Replaced to normal - Mg ok  COPD via CXR in known smoker Cont scheduled nebs - no wheezing at this time so will  avoid steroids  Hyperglycemia  recent A1c did NOT meet criteria for true DM - patient started on stress dose steroids will cover with moderate SSI  Diarrhea  Resolved   Anemia chronic disease vs dilutional - stable Hgb   PVD s/p stent placement to right external iliac artery 05/15/14 (Dr. Donnetta Hutching) Foot is warm w/ pulse - pt denies complaints - cont Plavix   Severe malnutrition in the context of chronic illness Albumin 1.2 - nutrition consulted   Code Status: FULL Family Communication: no family present at time of exam Disposition Plan: SDU until recovered from bronchoscopy scheduled for tomorrow   Consultants: PCCM > TRH  Procedures: 3/7 thoracentesis   3/8 TTE - EF 55-60% - unable to comment on WMA - trivial pericardial effusion   Antibiotics: Cefepime 3/2 > 3/3 Levaquin 3/2 Vanc 3/2 > 3/4 Zosyn 3/4 >  DVT prophylaxis: SQ heparin   Objective: Blood pressure 91/48, pulse 63, temperature 98.7 F (37.1 C), temperature source Oral, resp. rate 17, height 5\' 9"  (1.753 m), weight 61.1 kg (134 lb 11.2 oz), SpO2 95 %.  Intake/Output Summary (Last 24 hours) at 06/27/14 1137 Last data filed at 06/27/14 3790  Gross per 24 hour  Intake   2915 ml  Output   2700 ml  Net    215 ml   Exam: General: No acute respiratory distress  Lungs: R basilar crackles - no wheeze  Cardiovascular: Regular rate and rhythm  Abdomen: Nontender, nondistended, soft, bowel sounds positive, no rebound, no ascites, no appreciable mass  Extremities: No significant cyanosis, clubbing, edema bilateral lower extremities  Data Reviewed: Basic Metabolic Panel:  Recent Labs Lab 06/21/14 0235 06/22/14 0312 06/23/14 0303 06/25/14 0405 06/26/14 0254 06/26/14 2130 06/27/14 0300  NA 129* 127* 127* 128* 130*  --  135  K 3.9 3.7 3.3* 3.2* 4.0  --  4.1  CL 108 102 100 100 103  --  105  CO2 17* 20 23 24 24   --  26  GLUCOSE 87 107* 93 112* 95  --  124*  BUN 6 6 <5* <5* 5*  --  7  CREATININE 0.54 0.57  0.58 0.50 0.54  --  0.52  CALCIUM 6.6* 6.8* 6.7* 6.7* 7.1*  --  7.8*  MG 1.8 2.0  --  1.8  --   --  2.1  PHOS 2.6  --   --  2.6  --  2.3  --     Liver Function Tests:  Recent Labs Lab 06/20/14 1337 06/23/14 0303 06/25/14 0405 06/25/14 1708 06/26/14 0254 06/27/14 0300  AST 30 28 31   --  52* 37  ALT 28 21 23   --  36 32  ALKPHOS 75 52 54  --  51 52  BILITOT 0.9 0.6 0.4  --  0.6 0.4  PROT 5.0* 4.0* 4.4* 4.6* 4.2* 5.0*  ALBUMIN 1.6* 1.2* 1.2*  --  1.2* 2.2*    Recent Labs Lab 06/20/14 1337 06/21/14 1400  LIPASE 29 21  AMYLASE  --  50   CBC:  Recent Labs Lab 06/20/14 1337  06/22/14 0312 06/23/14 0303 06/25/14 0405 06/26/14 1504 06/27/14 0300  WBC 14.1*  < > 13.0* 12.5* 12.2* 8.0 12.0*  NEUTROABS 12.3*  --  10.8*  --   --  6.3 11.3*  HGB 10.1*  < > 9.4* 9.2* 9.2* 9.0* 9.2*  HCT 30.4*  < > 27.8* 26.3* 26.5* 26.3* 27.4*  MCV 87.1  < > 87.7 84.6 84.4 86.2 86.4  PLT 417*  < > 380 368 366 307 309  < > = values in this interval not displayed.   Cardiac Enzymes:  Recent Labs Lab 06/20/14 1600  TROPONINI <0.03   CBG:  Recent Labs Lab 06/26/14 1223 06/26/14 1700 06/26/14 2316 06/27/14 0413 06/27/14 0747  GLUCAP 159* 112* 148* 152* 133*    Recent Results (from the past 240 hour(s))  Blood Culture (routine x 2)     Status: None   Collection Time: 06/20/14  1:49 PM  Result Value Ref Range Status   Specimen Description BLOOD ARM RIGHT  Final   Special Requests BOTTLES DRAWN AEROBIC AND ANAEROBIC 5CC  Final   Culture   Final    NO GROWTH 5 DAYS Performed at [facility]    Report Status 06/26/2014 FINAL  Final  Urine culture     Status: None   Collection Time: 06/20/14  2:12 PM  Result Value Ref Range Status   Specimen Description URINE, RANDOM  Final   Special Requests NONE  Final   Colony Count NO GROWTH Performed at [facility]   Final   Culture NO GROWTH Performed at [facility]   Final   Report Status 06/21/2014 FINAL   Final  Blood Culture (routine x 2)     Status: None   Collection Time: 06/20/14  2:49 PM  Result Value Ref Range Status   Specimen Description BLOOD RIGHT FOREARM  Final   Special Requests BOTTLES DRAWN AEROBIC AND ANAEROBIC 4CC  Final   Culture  Final    NO GROWTH 5 DAYS Performed at [facility]    Report Status 06/26/2014 FINAL  Final  MRSA PCR Screening     Status: None   Collection Time: 06/20/14  5:20 PM  Result Value Ref Range Status   MRSA by PCR NEGATIVE NEGATIVE Final    Comment:        The GeneXpert MRSA Assay (FDA approved for NASAL specimens only), is one component of a comprehensive MRSA colonization surveillance program. It is not intended to diagnose MRSA infection nor to guide or monitor treatment for MRSA infections.   Culture, expectorated sputum-assessment     Status: None   Collection Time: 06/21/14  3:39 PM  Result Value Ref Range Status   Specimen Description SPUTUM  Final   Special Requests NONE  Final   Sputum evaluation   Final    MICROSCOPIC FINDINGS SUGGEST THAT THIS SPECIMEN IS NOT REPRESENTATIVE OF LOWER RESPIRATORY SECRETIONS. PLEASE RECOLLECT. Avis Epley RN [id number] 06/21/14 A BROWNING    Report Status 06/21/2014 FINAL  Final  Body fluid culture     Status: None (Preliminary result)   Collection Time: 06/25/14  3:15 PM  Result Value Ref Range Status   Specimen Description FLUID RIGHT PLEURAL  Final   Special Requests Normal  Final   Gram Stain   Final    FEW WBC PRESENT, PREDOMINANTLY PMN NO ORGANISMS SEEN Performed at [facility]    Culture NO GROWTH Performed at [facility]   Final   Report Status PENDING  Incomplete  AFB culture with smear     Status: None (Preliminary result)   Collection Time: 06/25/14  3:15 PM  Result Value Ref Range Status   Specimen Description FLUID RIGHT PLEURAL  Final   Special Requests Normal  Final   Acid Fast Smear   Final    NO ACID FAST BACILLI SEEN Performed at  [facility]    Culture   Final    CULTURE WILL BE EXAMINED FOR 6 WEEKS BEFORE ISSUING A FINAL REPORT Performed at [facility]    Report Status PENDING  Incomplete  Fungal stain     Status: None   Collection Time: 06/25/14  3:15 PM  Result Value Ref Range Status   Specimen Description FLUID RIGHT PLEURAL  Final   Special Requests Normal  Final   Fungal Smear   Final    NO YEAST OR FUNGAL ELEMENTS SEEN Performed at [facility]    Report Status 06/26/2014 FINAL  Final     Studies:  Recent x-ray studies have been reviewed in detail by the Attending Physician  Scheduled Meds:  Scheduled Meds: . antiseptic oral rinse  7 mL Mouth Rinse q12n4p  . chlorhexidine  15 mL Mouth Rinse BID  . clopidogrel  75 mg Oral Daily  . feeding supplement (GLUCERNA SHAKE)  237 mL Oral TID BM  . heparin  5,000 Units Subcutaneous 3 times per day  . hydrocortisone sod succinate (SOLU-CORTEF) inj  100 mg Intravenous Q8H  . insulin aspart  0-15 Units Subcutaneous Q4H  . ipratropium-albuterol  3 mL Nebulization QID  . multivitamin with minerals  1 tablet Oral Daily  . piperacillin-tazobactam (ZOSYN)  IV  3.375 g Intravenous Q8H    Time spent on care of this patient: 25 mins   [facility] , MD   [facility] Office  [phone] Pager - Text Page per Shea Evans as per below:  On-Call/Text Page:  [email or web address]      password [email or web address]  If 7PM-7AM, please contact night-coverage [email or web address] Password [email or web address] 06/27/2014, 11:37 AM   LOS: 7 days

## 2014-06-27 NOTE — Progress Notes (Signed)
PULMONARY / CRITICAL CARE MEDICINE   Name: Dustin Freeman MRN: [id number] DOB: 06-30-1955    ADMISSION DATE:  06/20/2014 CONSULTATION DATE:  06/27/2014  REFERRING MD :  EDP  CHIEF COMPLAINT:  Dizziness, cough, diarrhea  INITIAL PRESENTATION:  59 y/o M w/ PMHx of DM type II and PVD s/p stent placement of right external iliac artery, brought to [facility] ED 3/2 for dizziness, cough, diarrhea. Hospitalized 3 weeks ago for syncope and underwent stent placement in occluded right external iliac artery at that time.  Ever since being discharged, has had diarrhea.  In ED, CXR c/w RLL consolidation and patient remained hypotensive despite 4L IVF.  PCCM consulted for admission.  Asked to see patient 3/5 because CT chest shows R hilar mass involving Bronchus Intermedius and pericardium. Biopsy consideration requested.  STUDIES:  CXR 3/2 >>> RLL consolidation CT chest 06/22/14  SIGNIFICANT EVENTS: 1/24 - 1/27 - admitted for syncope.  Found to have PVD with RLE claudication due to occluded right external iliac artery.  Underwent stent placement 3/2 - admit with HCAP 3/4- CT R hilar mass, pneumonia, bilat effusion  SUBJECTIVE:  Cough only producing clear sputum.  VITAL SIGNS: Temp:  [97.3 F (36.3 C)-98.7 F (37.1 C)] 98.7 F (37.1 C) (03/09 0700) Pulse Rate:  [61-116] 62 (03/09 1202) Resp:  [16-35] 23 (03/09 1202) BP: (91-137)/(48-70) 110/70 mmHg (03/09 1202) SpO2:  [92 %-96 %] 95 % (03/09 1202) FiO2 (%):  [45 %] 45 % (03/08 2320) Weight:  [61.1 kg (134 lb 11.2 oz)] 61.1 kg (134 lb 11.2 oz) (03/09 0500)   HEMODYNAMICS:   VENTILATOR SETTINGS: Vent Mode:  [-]  FiO2 (%):  [45 %] 45 % INTAKE / OUTPUT: Intake/Output      03/08 0701 - 03/09 0700 03/09 0701 - 03/10 0700   P.O. 960 480   I.V. (mL/kg) 1415 (23.2)    IV Piggyback 350    Total Intake(mL/kg) 2725 (44.6) 480 (7.9)   Urine (mL/kg/hr) 2700 (1.8)    Stool 0 (0)    Total Output 2700     Net +25 +480        Urine Occurrence  1 x    Stool Occurrence 0 x 1 x     PHYSICAL EXAMINATION: General: Frail adult male, in NAD. Intermittent cough.  Neuro: Awake A&O x 3, No focal neuro deficits.  HEENT: PERRL, EOMI, sclerae anicteric. Cardiovascular: RRR, no M/R/G.  Lungs: Respirations even and unlabored.  Coarse crackles RLL. Abdomen: BS x 4, soft, NT/ND.  Musculoskeletal: No gross deformities, no edema.  Skin: Intact, warm, no rashes.  LABS:  CBC  Recent Labs Lab 06/25/14 0405 06/26/14 1504 06/27/14 0300  WBC 12.2* 8.0 12.0*  HGB 9.2* 9.0* 9.2*  HCT 26.5* 26.3* 27.4*  PLT 366 307 309   BMET  Recent Labs Lab 06/25/14 0405 06/26/14 0254 06/27/14 0300  NA 128* 130* 135  K 3.2* 4.0 4.1  CL 100 103 105  CO2 24 24 26   BUN <5* 5* 7  CREATININE 0.50 0.54 0.52  GLUCOSE 112* 95 124*   Electrolytes  Recent Labs Lab 06/21/14 0235 06/22/14 0312  06/25/14 0405 06/26/14 0254 06/26/14 2130 06/27/14 0300  CALCIUM 6.6* 6.8*  < > 6.7* 7.1*  --  7.8*  MG 1.8 2.0  --  1.8  --   --  2.1  PHOS 2.6  --   --  2.6  --  2.3  --   < > = values in this interval not displayed.  Sepsis Markers  Recent Labs Lab 06/20/14 1443 06/20/14 1600 06/22/14 0921  LATICACIDVEN 2.07* 2.2* 1.6    Liver Enzymes  Recent Labs Lab 06/25/14 0405 06/26/14 0254 06/27/14 0300  AST 31 52* 37  ALT 23 36 32  ALKPHOS 54 51 52  BILITOT 0.4 0.6 0.4  ALBUMIN 1.2* 1.2* 2.2*    Cardiac Enzymes  Recent Labs Lab 06/20/14 1600  TROPONINI <0.03   Glucose  Recent Labs Lab 06/26/14 1223 06/26/14 1700 06/26/14 2316 06/27/14 0413 06/27/14 0747 06/27/14 1200  GLUCAP 159* 112* 148* 152* 133* 131*    Imaging Dg Chest Port 1 View  06/25/2014   CLINICAL DATA:  Post thoracentesis.  EXAM: PORTABLE CHEST - 1 VIEW  COMPARISON:  06/21/2014  FINDINGS: Bilateral lower lobe airspace opacities are noted, increasing on the left since prior study. Suspect layering effusions. , increasing on the left as well since prior study. No  pneumothorax. There is a lying superimposed over the left upper lobe which simulates a pneumothorax, but lung markings are noted lateral to this line compatible with artifact.  Heart is borderline in size.  No acute bony abnormality.  IMPRESSION: Bilateral lower lobe airspace opacities and layering effusions, both worsening on the left since prior study. No pneumothorax.   Electronically Signed   By: Rolm Baptise M.D.   On: 06/25/2014 15:51   CT chest- 3/5- I reviewed images and agree with interpretation. Bibasilar pneumonia and effusions, R hilar mass appears to involve bronchus intermedius and pericardium.  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 He is very weak, but within a few days he may be strong enough to tolerate bronch seeking endobronchial tumor or possible TBNA in R mainstem. Concern that tumor in pericardium may lead to tamponade. Consider thoracentesis R for decompression, cytology and cultures, but may only be inflammatory. Sputum cx non-diagnostic  PULMONARY A: HCAP rt base, aspiration? COPD by CXR - has never had PFT's Tobacco use disorder Thora done 3/7 with >1 L of fluid removed, appears borderline (LDH ratio of 0.65). P:   Pulmonary hygiene. Continue tobacco cessation efforts. Abx - now on Zosyn Continue bronchodilators for now Down to 3L [place], cytology is negative, will plan for bronchoscopy in AM.  CARDIOVASCULAR A:  Shock- hypovolemic (diarrhea, poor po intake) + early sepsis 2/2 HCAP PVD s/p stent placement to right external iliac artery 05/15/14 (Dr. Donnetta Hutching) P:  Per primary team. Don't see significant pericardial effusion yet.  RENAL A:   Hyponatremia Hypokalemia Mild Non-AG Acidosis; likely 2/2 volume repletion  P:   Saline maintenance Replace K as needed  GASTROINTESTINAL A:   Protein calorie malnutrition Nutrition malnurished Diarrhea resolved? at risk mesenteric ischemia, has no abdo pain P:    Per primary team  HEMATOLOGIC A:   Anemia; chronic disease vs dilutional Leukocytosis persists VTE Prophylaxis P:  Transfuse for Hgb < 7. SCD's / Heparin [place] May restart [place] heparin.  INFECTIOUS A:   HCAP; BLL infiltrate on CXR R/o C. Diff P:   BCx2 3/2 > neg UCx 3/2 > Sputum Cx 3/2 > C. Diff PCR 3/2 > Abx: Vanc, start date 3/2  Abx: Levaquin, start date 3/2 >>>3/3 Zosyn 3/4>>  Continue abx, will plan bronchoscopy in AM.  Rush Farmer, M.D. [facility] Pulmonary/Critical Care Medicine. Pager: [phone]. After hours pager: [phone].  06/27/2014 12:34 PM

## 2014-06-28 ENCOUNTER — Encounter (HOSPITAL_COMMUNITY)
Admission: EM | Disposition: A | Discharge status: Skilled Nursing Facility | Payer: Self-pay | Source: Home / Self Care | Attending: Internal Medicine

## 2014-06-28 ENCOUNTER — Inpatient Hospital Stay (HOSPITAL_COMMUNITY): Payer: Medicaid Other

## 2014-06-28 DIAGNOSIS — A419 Sepsis, unspecified organism: Secondary | ICD-10-CM | POA: Diagnosis present

## 2014-06-28 DIAGNOSIS — Z9889 Other specified postprocedural states: Secondary | ICD-10-CM | POA: Diagnosis present

## 2014-06-28 DIAGNOSIS — R6521 Severe sepsis with septic shock: Secondary | ICD-10-CM

## 2014-06-28 HISTORY — PX: VIDEO BRONCHOSCOPY: SHX5072

## 2014-06-28 LAB — COMPREHENSIVE METABOLIC PANEL
ALBUMIN: 1.9 g/dL — AB (ref 3.5–5.2)
ALT: 28 U/L (ref 0–53)
ANION GAP: 3 — AB (ref 5–15)
AST: 27 U/L (ref 0–37)
Alkaline Phosphatase: 45 U/L (ref 39–117)
BUN: 9 mg/dL (ref 6–23)
CO2: 24 mmol/L (ref 19–32)
Calcium: 7.6 mg/dL — ABNORMAL LOW (ref 8.4–10.5)
Chloride: 106 mmol/L (ref 96–112)
Creatinine, Ser: 0.43 mg/dL — ABNORMAL LOW (ref 0.50–1.35)
GFR calc Af Amer: >90 mL/min (ref >90)
Glucose, Bld: 132 mg/dL — ABNORMAL HIGH (ref 70–99)
Potassium: 3.2 mmol/L — ABNORMAL LOW (ref 3.5–5.1)
Sodium: 133 mmol/L — ABNORMAL LOW (ref 135–145)
Total Bilirubin: 0.4 mg/dL (ref 0.3–1.2)
Total Protein: 4.9 g/dL — ABNORMAL LOW (ref 6.0–8.3)

## 2014-06-28 LAB — CBC WITH DIFFERENTIAL/PLATELET
BASOS ABS: 0 10*3/uL (ref 0.0–0.1)
BASOS PCT: 0 % (ref 0–1)
Eosinophils Absolute: 0 10*3/uL (ref 0.0–0.7)
Eosinophils Relative: 0 % (ref 0–5)
HCT: 28.4 % — ABNORMAL LOW (ref 39.0–52.0)
Hemoglobin: 9.7 g/dL — ABNORMAL LOW (ref 13.0–17.0)
LYMPHS PCT: 10 % — AB (ref 12–46)
Lymphs Abs: 1.1 10*3/uL (ref 0.7–4.0)
MCH: 29.2 pg (ref 26.0–34.0)
MCHC: 34.2 g/dL (ref 30.0–36.0)
MCV: 85.5 fL (ref 78.0–100.0)
Monocytes Absolute: 0.3 10*3/uL (ref 0.1–1.0)
Monocytes Relative: 3 % (ref 3–12)
NEUTROS PCT: 87 % — AB (ref 43–77)
Neutro Abs: 9.7 10*3/uL — ABNORMAL HIGH (ref 1.7–7.7)
Platelets: 359 10*3/uL (ref 150–400)
RBC: 3.32 MIL/uL — AB (ref 4.22–5.81)
RDW: 15 % (ref 11.5–15.5)
WBC: 11.1 10*3/uL — AB (ref 4.0–10.5)

## 2014-06-28 LAB — MAGNESIUM: Magnesium: 1.9 mg/dL (ref 1.5–2.5)

## 2014-06-28 LAB — GLUCOSE, CAPILLARY
GLUCOSE-CAPILLARY: 133 mg/dL — AB (ref 70–99)
GLUCOSE-CAPILLARY: 192 mg/dL — AB (ref 70–99)
GLUCOSE-CAPILLARY: 140 mg/dL — AB (ref 70–99)
Glucose-Capillary: 124 mg/dL — ABNORMAL HIGH (ref 70–99)

## 2014-06-28 LAB — ANA, BODY FLUID: Anti-Nuclear Ab, IgG: NOT DETECTED

## 2014-06-28 SURGERY — VIDEO BRONCHOSCOPY WITHOUT FLUORO
Anesthesia: Moderate Sedation | Laterality: Bilateral

## 2014-06-28 MED ORDER — FENTANYL CITRATE 0.05 MG/ML IJ SOLN
INTRAMUSCULAR | Status: AC
  Filled 2014-06-28: qty 4

## 2014-06-28 MED ORDER — MIDAZOLAM HCL 10 MG/2ML IJ SOLN
INTRAMUSCULAR | Status: DC | PRN
  Administered 2014-06-28: 1 mg via INTRAVENOUS
  Administered 2014-06-28: .5 mg via INTRAVENOUS

## 2014-06-28 MED ORDER — PHENYLEPHRINE HCL 0.25 % NA SOLN
NASAL | Status: DC | PRN
  Administered 2014-06-28: 2 via NASAL

## 2014-06-28 MED ORDER — MIDAZOLAM HCL 5 MG/ML IJ SOLN
INTRAMUSCULAR | Status: AC
  Filled 2014-06-28: qty 2

## 2014-06-28 MED ORDER — LIDOCAINE HCL 2 % EX GEL
CUTANEOUS | Status: DC | PRN
  Administered 2014-06-28: 1

## 2014-06-28 MED ORDER — LORAZEPAM 2 MG/ML IJ SOLN
0.5000 mg | INTRAMUSCULAR | Status: DC | PRN
  Administered 2014-06-28 – 2014-07-06 (×2): 1 mg via INTRAVENOUS
  Filled 2014-06-28 (×2): qty 1

## 2014-06-28 MED ORDER — LIDOCAINE HCL (PF) 1 % IJ SOLN
INTRAMUSCULAR | Status: DC | PRN
Start: 2014-06-28 — End: 2014-07-02
  Administered 2014-06-28: 6 mL

## 2014-06-28 MED ORDER — POTASSIUM CHLORIDE CRYS ER 20 MEQ PO TBCR
40.0000 meq | EXTENDED_RELEASE_TABLET | Freq: Once | ORAL | Status: AC
  Administered 2014-06-28: 40 meq via ORAL
  Filled 2014-06-28: qty 2

## 2014-06-28 MED ORDER — FENTANYL CITRATE 0.05 MG/ML IJ SOLN
INTRAMUSCULAR | Status: DC | PRN
Start: 2014-06-28 — End: 2014-07-02
  Administered 2014-06-28: 25 ug via INTRAVENOUS
  Administered 2014-06-28: 50 ug via INTRAVENOUS
  Administered 2014-06-28: 25 ug via INTRAVENOUS

## 2014-06-28 NOTE — Progress Notes (Signed)
PULMONARY / CRITICAL CARE MEDICINE   Name: Dustin Freeman MRN: [id number] DOB: 23-Nov-1955    ADMISSION DATE:  06/20/2014 CONSULTATION DATE:  06/28/2014  REFERRING MD :  EDP  CHIEF COMPLAINT:  Dizziness, cough, diarrhea  INITIAL PRESENTATION:  59 y/o M w/ PMHx of DM type II and PVD s/p stent placement of right external iliac artery, brought to [facility] ED 3/2 for dizziness, cough, diarrhea. Hospitalized 3 weeks ago for syncope and underwent stent placement in occluded right external iliac artery at that time.  Ever since being discharged, has had diarrhea.  In ED, CXR c/w RLL consolidation and patient remained hypotensive despite 4L IVF.  PCCM consulted for admission.  Asked to see patient 3/5 because CT chest shows R hilar mass involving Bronchus Intermedius and pericardium. Biopsy consideration requested.  STUDIES:  CXR 3/2 >>> RLL consolidation CT chest 06/22/14  SIGNIFICANT EVENTS: 1/24 - 1/27 - admitted for syncope.  Found to have PVD with RLE claudication due to occluded right external iliac artery.  Underwent stent placement 3/2 - admit with HCAP 3/4- CT R hilar mass, pneumonia, bilat effusion  SUBJECTIVE:  Cough only producing clear sputum.  VITAL SIGNS: Temp:  [97.4 F (36.3 C)-98.7 F (37.1 C)] 98.1 F (36.7 C) (03/10 0930) Pulse Rate:  [55-83] 64 (03/10 1030) Resp:  [13-25] 18 (03/10 1030) BP: (110-155)/(60-79) 140/71 mmHg (03/10 1025) SpO2:  [87 %-99 %] 92 % (03/10 1030) FiO2 (%):  [40 %] 40 % (03/10 0816) Weight:  [61.1 kg (134 lb 11.2 oz)] 61.1 kg (134 lb 11.2 oz) (03/10 0346)   HEMODYNAMICS:   VENTILATOR SETTINGS: Vent Mode:  [-]  FiO2 (%):  [40 %] 40 % INTAKE / OUTPUT: Intake/Output      03/09 0701 - 03/10 0700 03/10 0701 - 03/11 0700   P.O. 1200    I.V. (mL/kg) 720 (11.8)    IV Piggyback     Total Intake(mL/kg) 1920 (31.4)    Urine (mL/kg/hr) 875 (0.6) 100 (0.4)   Stool 0 (0)    Total Output 875 100   Net +1045 -100        Urine Occurrence 1 x    Stool  Occurrence 1 x      PHYSICAL EXAMINATION: General: Frail adult male, in NAD. Intermittent cough.  Neuro: Awake A&O x 3, No focal neuro deficits.  HEENT: PERRL, EOMI, sclerae anicteric. Cardiovascular: RRR, no M/R/G.  Lungs: Respirations even and unlabored.  Coarse crackles RLL. Abdomen: BS x 4, soft, NT/ND.  Musculoskeletal: No gross deformities, no edema.  Skin: Intact, warm, no rashes.  LABS:  CBC  Recent Labs Lab 06/26/14 1504 06/27/14 0300 06/28/14 0446  WBC 8.0 12.0* 11.1*  HGB 9.0* 9.2* 9.7*  HCT 26.3* 27.4* 28.4*  PLT 307 309 359   BMET  Recent Labs Lab 06/26/14 0254 06/27/14 0300 06/28/14 0446  NA 130* 135 133*  K 4.0 4.1 3.2*  CL 103 105 106  CO2 24 26 24   BUN 5* 7 9  CREATININE 0.54 0.52 0.43*  GLUCOSE 95 124* 132*   Electrolytes  Recent Labs Lab 06/25/14 0405 06/26/14 0254 06/26/14 2130 06/27/14 0300 06/28/14 0446  CALCIUM 6.7* 7.1*  --  7.8* 7.6*  MG 1.8  --   --  2.1 1.9  PHOS 2.6  --  2.3  --   --    Sepsis Markers  Recent Labs Lab 06/22/14 0921  LATICACIDVEN 1.6    Liver Enzymes  Recent Labs Lab 06/26/14 0254 06/27/14 0300 06/28/14  0446  AST 52* 37 27  ALT 36 32 28  ALKPHOS 51 52 45  BILITOT 0.6 0.4 0.4  ALBUMIN 1.2* 2.2* 1.9*    Cardiac Enzymes No results for input(s): TROPONINI, PROBNP in the last 168 hours. Glucose  Recent Labs Lab 06/27/14 0413 06/27/14 0747 06/27/14 1200 06/27/14 1712 06/27/14 2204 06/28/14 0735  GLUCAP 152* 133* 131* 184* 127* 133*    Imaging No results found. CT chest- 3/5- I reviewed images and agree with interpretation. Bibasilar pneumonia and effusions, R hilar mass appears to involve bronchus intermedius and pericardium.  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 He is very weak, but within a few days he may be strong enough to tolerate bronch seeking endobronchial tumor or possible TBNA in R mainstem. Concern that tumor in  pericardium may lead to tamponade. Consider thoracentesis R for decompression, cytology and cultures, but may only be inflammatory. Sputum cx non-diagnostic  PULMONARY A: HCAP rt base, aspiration? COPD by CXR - has never had PFT's Tobacco use disorder Thora done 3/7 with >1 L of fluid removed, appears borderline (LDH ratio of 0.65). P:   Pulmonary hygiene. Continue tobacco cessation efforts. Abx - now on Zosyn Continue bronchodilators for now Titrate O2 for sat of 88-92%. Bronch today.  CARDIOVASCULAR A:  Shock- hypovolemic (diarrhea, poor po intake) + early sepsis 2/2 HCAP PVD s/p stent placement to right external iliac artery 05/15/14 (Dr. Donnetta Hutching) P:  Per primary team. Don't see significant pericardial effusion yet.  RENAL A:   Hyponatremia Hypokalemia Mild Non-AG Acidosis; likely 2/2 volume repletion  P:   Saline maintenance Replace K as needed  GASTROINTESTINAL A:   Protein calorie malnutrition Nutrition malnurished Diarrhea resolved? at risk mesenteric ischemia, has no abdo pain P:   Per primary team  HEMATOLOGIC A:   Anemia; chronic disease vs dilutional Leukocytosis persists VTE Prophylaxis P:  Transfuse for Hgb < 7. SCD's / Heparin [place] May restart  heparin.  INFECTIOUS A:   HCAP; BLL infiltrate on CXR R/o C. Diff P:   BCx2 3/2 > neg UCx 3/2 > Sputum Cx 3/2 > C. Diff PCR 3/2 > Abx: Vanc, start date 3/2  Abx: Levaquin, start date 3/2 >>>3/3 Zosyn 3/4>>  Continue abx, will plan bronchoscopy today then will f/u.  Rush Farmer, M.D. [facility] Pulmonary/Critical Care Medicine. Pager: [phone]. After hours pager: [phone].  06/28/2014 10:44 AM

## 2014-06-28 NOTE — Progress Notes (Signed)
UR COMPLETED  

## 2014-06-28 NOTE — Significant Event (Addendum)
Pt returned from endo with rectal temp of 95.1. Dr.Woods paged. Pt covered with warm blankets. No s/s of acute distress noted. Awaiting call back from Dr. Sherral Hammers. Will continue to monitor pt's condition.  1222: received call back from Dr. Sherral Hammers with instructions to place patient on bair hugger.

## 2014-06-28 NOTE — Progress Notes (Signed)
[place] TEAM 1 - Stepdown/ICU TEAM Progress Note  Dustin Freeman GXQ:[id number] DOB: [birth year] DOA: 06/20/2014 PCP: No PCP Per Patient  Admit HPI / Brief Narrative: 59 y/o WM PMHx diabetes mellitus type 2, PVD status post stent of right external iliac artery, that presented to ED with dizziness, cough, and diarrhea.  Patient with recent hospitalization for syncope and underwent stent placement in occluded right external iliac artery.  He states he's had the diarrhea since discharge from the hospital on   . In the ED, chest x-ray with lower left lobe consolidation, patient remained hypotensive despite IV fluid, and was thus admitted under [facility] and care.  CCM was reconsulted on 35 CT showed right hilar mass involving the bronchus intermedius and pericardium.  On 310 patient underwent bronchoscopy pending results.   HPI/Subjective: 3/10 A/O 4, positive SOB unless on Ventimask. Some right-sided lateral chest wall pain.  Assessment/Plan:  Extensive RLL, RML, and partial RUL postobustructive PNA - LLL infiltrate -CXR with worsening PNA and concern of postobstructive etiology -CT chest -large R hilar mass, Pulm performed thora but cytology not diagnostic, pt informed that is is almost definitely CA.  -Per CCM- Bronchoscopy 3/10- pending results; per Dr. Nelda Marseille [facility] M) right bronchus intermedius completely obstructed not amenable to any type of stenting. -Attempt to contact oncology did not receive return phone call. Will attempt again in the a.m. -Cont Cont emperic broad coverage  -Titrate O2 to maintain SPO2 89-93%  Large soft tissue mass in the right hilum  -obstructs the Rt bronchus intermedius, malignancy suspected - PCCM following- see above. Bronchoscopy 3/10- results pending  Hypovolemic Shock - Sepsis due to PNA - resolved- BP stable -Cortisol normal - Continue stress dose steroids Solu-Cortef 100 mg TID     Hyponatremia  -Possible element of SIADH, but agree w/ Dr. Sherral Hammers that  fluid restriction not appropriate - Na has improved w/ volume which does indeed argue against SIADH   Mild hypokalemia Replaced to normal - Mg ok  COPD via CXR in known smoker Cont scheduled nebs - no wheezing at this time   Hyperglycemia  -Hgb a1c 6.0 -on steoids, continue mod SSI   Diarrhea  Resolved   Anemia hgb 9.7- stable - chronic disease vs dilutional   PVD s/p stent placement to right external iliac artery 05/15/14 (Dr. Donnetta Hutching) Foot is warm w/ pulse - pt denies complaints - cont Plavix   Severe malnutrition in the context of chronic illness Albumin 1.2 - nutrition consulted   Hypothermia  -Use bare hugger to maintain temperature 36.4C   Code Status: FULL Family Communication: no family present at time of exam Disposition Plan: SDU until recovered from bronchoscopy   Consultants: Dr. Rush Farmer (PCCM)    Procedure/Significant Events:  3/10 Bronchoscopy 3/7 Thoracentesis 3/8- TEE EF 55-60%, Trivial pericardial effusion at RV apex. Inadequate assessment of LV wall   Culture BCx2 3/2 > neg UCx 3/2 > Sputum Cx 3/2 > C. Diff PCR 3/2 > 3/10 pathology/cytology from bronchoscopy pending   Antibiotics: Vanc, start date 3/2   Levaquin, start date 3/2 >>>3/3 Zosyn 3/4>>   DVT prophylaxis:  SCDs   Devices None  LINES / TUBES:  None    Continuous Infusions: . sodium chloride 60 mL/hr at 06/28/14 0849    Objective: VITAL SIGNS: Temp: 97.3 F (36.3 C) (03/10 1629) Temp Source: Oral (03/10 1629) BP: 127/65 mmHg (03/10 1200) Pulse Rate: 79 (03/10 1457) SPO2; FIO2:   Intake/Output Summary (Last 24 hours) at 06/28/14  [place] filed at 06/28/14 1557  Gross per 24 hour  Intake   1320 ml  Output   1500 ml  Net   -180 ml     Exam: General: A/O 4, acute respiratory distress still requiring Venturi mask to maintain SPO2 in the low 90s Lungs: poor air movement diffusely. No breath sounds RLL, negative wheezes or  crackles Cardiovascular: Regular rate and rhythm without murmur gallop or rub normal S1 and S2 Abdomen: Nontender, nondistended, soft, bowel sounds positive, no rebound, no ascites, no appreciable mass Extremities: No significant cyanosis, clubbing, or edema bilateral lower extremities  Data Reviewed: Basic Metabolic Panel:  Recent Labs Lab 06/22/14 0312 06/23/14 0303 06/25/14 0405 06/26/14 0254 06/26/14 2130 06/27/14 0300 06/28/14 0446  NA 127* 127* 128* 130*  --  135 133*  K 3.7 3.3* 3.2* 4.0  --  4.1 3.2*  CL 102 100 100 103  --  105 106  CO2 20 23 24 24   --  26 24  GLUCOSE 107* 93 112* 95  --  124* 132*  BUN 6 <5* <5* 5*  --  7 9  CREATININE 0.57 0.58 0.50 0.54  --  0.52 0.43*  CALCIUM 6.8* 6.7* 6.7* 7.1*  --  7.8* 7.6*  MG 2.0  --  1.8  --   --  2.1 1.9  PHOS  --   --  2.6  --  2.3  --   --    Liver Function Tests:  Recent Labs Lab 06/23/14 0303 06/25/14 0405 06/25/14 1708 06/26/14 0254 06/27/14 0300 06/28/14 0446  AST 28 31  --  52* 37 27  ALT 21 23  --  36 32 28  ALKPHOS 52 54  --  51 52 45  BILITOT 0.6 0.4  --  0.6 0.4 0.4  PROT 4.0* 4.4* 4.6* 4.2* 5.0* 4.9*  ALBUMIN 1.2* 1.2*  --  1.2* 2.2* 1.9*   No results for input(s): LIPASE, AMYLASE in the last 168 hours. No results for input(s): AMMONIA in the last 168 hours. CBC:  Recent Labs Lab 06/22/14 0312 06/23/14 0303 06/25/14 0405 06/26/14 1504 06/27/14 0300 06/28/14 0446  WBC 13.0* 12.5* 12.2* 8.0 12.0* 11.1*  NEUTROABS 10.8*  --   --  6.3 11.3* 9.7*  HGB 9.4* 9.2* 9.2* 9.0* 9.2* 9.7*  HCT 27.8* 26.3* 26.5* 26.3* 27.4* 28.4*  MCV 87.7 84.6 84.4 86.2 86.4 85.5  PLT 380 368 366 307 309 359   Cardiac Enzymes: No results for input(s): CKTOTAL, CKMB, CKMBINDEX, TROPONINI in the last 168 hours. BNP (last 3 results) No results for input(s): BNP in the last 8760 hours.  ProBNP (last 3 results) No results for input(s): PROBNP in the last 8760 hours.  CBG:  Recent Labs Lab 06/27/14 1200  06/27/14 1712 06/27/14 2204 06/28/14 0735 06/28/14 1205  GLUCAP 131* 184* 127* 133* 124*    Recent Results (from the past 240 hour(s))  Blood Culture (routine x 2)     Status: None   Collection Time: 06/20/14  1:49 PM  Result Value Ref Range Status   Specimen Description BLOOD ARM RIGHT  Final   Special Requests BOTTLES DRAWN AEROBIC AND ANAEROBIC 5CC  Final   Culture   Final    NO GROWTH 5 DAYS Performed at [facility]    Report Status 06/26/2014 FINAL  Final  Urine culture     Status: None   Collection Time: 06/20/14  2:12 PM  Result Value Ref Range Status  Specimen Description URINE, RANDOM  Final   Special Requests NONE  Final   Colony Count NO GROWTH Performed at [facility]   Final   Culture NO GROWTH Performed at [facility]   Final   Report Status 06/21/2014 FINAL  Final  Blood Culture (routine x 2)     Status: None   Collection Time: 06/20/14  2:49 PM  Result Value Ref Range Status   Specimen Description BLOOD RIGHT FOREARM  Final   Special Requests BOTTLES DRAWN AEROBIC AND ANAEROBIC 4CC  Final   Culture   Final    NO GROWTH 5 DAYS Performed at [facility]    Report Status 06/26/2014 FINAL  Final  MRSA PCR Screening     Status: None   Collection Time: 06/20/14  5:20 PM  Result Value Ref Range Status   MRSA by PCR NEGATIVE NEGATIVE Final    Comment:        The GeneXpert MRSA Assay (FDA approved for NASAL specimens only), is one component of a comprehensive MRSA colonization surveillance program. It is not intended to diagnose MRSA infection nor to guide or monitor treatment for MRSA infections.   Culture, expectorated sputum-assessment     Status: None   Collection Time: 06/21/14  3:39 PM  Result Value Ref Range Status   Specimen Description SPUTUM  Final   Special Requests NONE  Final   Sputum evaluation   Final    MICROSCOPIC FINDINGS SUGGEST THAT THIS SPECIMEN IS NOT REPRESENTATIVE OF LOWER RESPIRATORY  SECRETIONS. PLEASE RECOLLECT. Avis Epley RN [id number] 06/21/14 A BROWNING    Report Status 06/21/2014 FINAL  Final  Body fluid culture     Status: None (Preliminary result)   Collection Time: 06/25/14  3:15 PM  Result Value Ref Range Status   Specimen Description FLUID RIGHT PLEURAL  Final   Special Requests Normal  Final   Gram Stain   Final    FEW WBC PRESENT, PREDOMINANTLY PMN NO ORGANISMS SEEN Performed at [facility]    Culture   Final    NO GROWTH 2 DAYS Performed at [facility]    Report Status PENDING  Incomplete  AFB culture with smear     Status: None (Preliminary result)   Collection Time: 06/25/14  3:15 PM  Result Value Ref Range Status   Specimen Description FLUID RIGHT PLEURAL  Final   Special Requests Normal  Final   Acid Fast Smear   Final    NO ACID FAST BACILLI SEEN Performed at [facility]    Culture   Final    CULTURE WILL BE EXAMINED FOR 6 WEEKS BEFORE ISSUING A FINAL REPORT Performed at [facility]    Report Status PENDING  Incomplete  Fungal stain     Status: None   Collection Time: 06/25/14  3:15 PM  Result Value Ref Range Status   Specimen Description FLUID RIGHT PLEURAL  Final   Special Requests Normal  Final   Fungal Smear   Final    NO YEAST OR FUNGAL ELEMENTS SEEN Performed at [facility]    Report Status 06/26/2014 FINAL  Final     Studies:  Recent x-ray studies have been reviewed in detail by the Attending Physician  Scheduled Meds:  Scheduled Meds: . antiseptic oral rinse  7 mL Mouth Rinse q12n4p  . chlorhexidine  15 mL Mouth Rinse BID  . clopidogrel  75 mg Oral Daily  . feeding supplement (ENSURE  COMPLETE)  237 mL Oral BID BM  . feeding supplement (GLUCERNA SHAKE)  237 mL Oral TID BM  . heparin  5,000 Units Subcutaneous 3 times per day  . hydrocortisone sod succinate (SOLU-CORTEF) inj  100 mg Intravenous Q8H  . insulin aspart  0-15 Units Subcutaneous TID WC  . ipratropium-albuterol   3 mL Nebulization QID  . multivitamin with minerals  1 tablet Oral Daily  . piperacillin-tazobactam (ZOSYN)  IV  3.375 g Intravenous Q8H    Time spent on care of this patient: 40 mins   Lacy Duverney , [facility]  [facility] Office  [phone] Pager - [phone]  On-Call/Text Page:      [email or web address]      password [email or web address]  If 7PM-7AM, please contact night-coverage [email or web address] Password [facility] 06/28/2014, 4:37 PM   LOS: 8 days  Examined Patient and discussed A&P with PA Sahar and agree with above plan.  I have reviewed the entire database. I have made any necessary editorial changes, and agree with its content. I have reviewed this patient's available data, including medical history, events of note, physical examination, radiology studies and test results as part of my evaluation  Pt with Multiple Complex medical problems> 35 min spent in direct Pt care    Dia Crawford, MD  [facility]  [phone] pager

## 2014-06-28 NOTE — Procedures (Signed)
Bronchoscopy Procedure Note Dustin Freeman [id number] 1955/11/22  Procedure: Bronchoscopy Indications: Obtain specimens for culture and/or other diagnostic studies  Procedure Details Consent: Risks of procedure as well as the alternatives and risks of each were explained to the (patient/caregiver).  Consent for procedure obtained. Time Out: Verified patient identification, verified procedure, site/side was marked, verified correct patient position, special equipment/implants available, medications/allergies/relevent history reviewed, required imaging and test results available.  Performed  In preparation for procedure, bronchoscope lubricated. Sedation: Benzodiazepines and narcotics  Airway entered and the following bronchi were examined: RUL, LUL, Lingula and LLL all inspected and WNL.   Bronchoscope removed.   Complete obstruction of the bronchus intermedius with a mass.  Unable to pass beyond the mass.  Evaluation Hemodynamic Status: BP stable throughout; O2 sats: stable throughout Patient's Current Condition: stable Specimens:  Sent serosanguinous fluid Complications: No apparent complications Patient did tolerate procedure well.   Dustin Freeman 06/28/2014

## 2014-06-28 NOTE — Progress Notes (Signed)
Video bronchoscopy with washing intervention, biopsy intervention. Pt. Did well thru out procedure all vitals good. Gave report to [facility].

## 2014-06-29 ENCOUNTER — Encounter (HOSPITAL_COMMUNITY): Payer: Self-pay | Admitting: Pulmonary Disease

## 2014-06-29 ENCOUNTER — Inpatient Hospital Stay (HOSPITAL_COMMUNITY): Payer: Medicaid Other

## 2014-06-29 DIAGNOSIS — Z720 Tobacco use: Secondary | ICD-10-CM

## 2014-06-29 DIAGNOSIS — C801 Malignant (primary) neoplasm, unspecified: Secondary | ICD-10-CM | POA: Diagnosis present

## 2014-06-29 DIAGNOSIS — E46 Unspecified protein-calorie malnutrition: Secondary | ICD-10-CM

## 2014-06-29 DIAGNOSIS — D630 Anemia in neoplastic disease: Secondary | ICD-10-CM

## 2014-06-29 LAB — BODY FLUID CULTURE
Culture: NO GROWTH
Special Requests: NORMAL

## 2014-06-29 LAB — COMPREHENSIVE METABOLIC PANEL
ALBUMIN: 1.9 g/dL — AB (ref 3.5–5.2)
ALK PHOS: 43 U/L (ref 39–117)
ALT: 37 U/L (ref 0–53)
AST: 42 U/L — ABNORMAL HIGH (ref 0–37)
Anion gap: 6 (ref 5–15)
BILIRUBIN TOTAL: 0.4 mg/dL (ref 0.3–1.2)
BUN: 11 mg/dL (ref 6–23)
CHLORIDE: 104 mmol/L (ref 96–112)
CO2: 27 mmol/L (ref 19–32)
Calcium: 7.8 mg/dL — ABNORMAL LOW (ref 8.4–10.5)
Creatinine, Ser: 0.53 mg/dL (ref 0.50–1.35)
GFR calc non Af Amer: >90 mL/min (ref >90)
GLUCOSE: 123 mg/dL — AB (ref 70–99)
POTASSIUM: 3.5 mmol/L (ref 3.5–5.1)
SODIUM: 137 mmol/L (ref 135–145)
Total Protein: 4.6 g/dL — ABNORMAL LOW (ref 6.0–8.3)

## 2014-06-29 LAB — CBC WITH DIFFERENTIAL/PLATELET
BASOS PCT: 0 % (ref 0–1)
Basophils Absolute: 0 10*3/uL (ref 0.0–0.1)
Eosinophils Absolute: 0 10*3/uL (ref 0.0–0.7)
Eosinophils Relative: 0 % (ref 0–5)
HEMATOCRIT: 28.3 % — AB (ref 39.0–52.0)
HEMOGLOBIN: 9.4 g/dL — AB (ref 13.0–17.0)
Lymphocytes Relative: 14 % (ref 12–46)
Lymphs Abs: 1 10*3/uL (ref 0.7–4.0)
MCH: 28.7 pg (ref 26.0–34.0)
MCHC: 33.2 g/dL (ref 30.0–36.0)
MCV: 86.3 fL (ref 78.0–100.0)
MONOS PCT: 4 % (ref 3–12)
Monocytes Absolute: 0.3 10*3/uL (ref 0.1–1.0)
NEUTROS ABS: 5.5 10*3/uL (ref 1.7–7.7)
Neutrophils Relative %: 82 % — ABNORMAL HIGH (ref 43–77)
Platelets: 361 10*3/uL (ref 150–400)
RBC: 3.28 MIL/uL — AB (ref 4.22–5.81)
RDW: 15.2 % (ref 11.5–15.5)
WBC: 6.7 10*3/uL (ref 4.0–10.5)

## 2014-06-29 LAB — GLUCOSE, CAPILLARY
GLUCOSE-CAPILLARY: 188 mg/dL — AB (ref 70–99)
GLUCOSE-CAPILLARY: 259 mg/dL — AB (ref 70–99)
Glucose-Capillary: 122 mg/dL — ABNORMAL HIGH (ref 70–99)
Glucose-Capillary: 102 mg/dL — ABNORMAL HIGH (ref 70–99)

## 2014-06-29 LAB — ADENOSINE DEAMINASE, FLUID: ADENOSINE DEAMINASE FL: 3.3 U/L (ref <7.6)

## 2014-06-29 LAB — RHEUMATOID FACTORS, FLUID: Cortisol #1 (Base): NEGATIVE

## 2014-06-29 LAB — MAGNESIUM: Magnesium: 2 mg/dL (ref 1.5–2.5)

## 2014-06-29 MED ORDER — IOHEXOL 300 MG/ML  SOLN
100.0000 mL | Freq: Once | INTRAMUSCULAR | Status: AC | PRN
  Administered 2014-06-29: 100 mL via INTRAVENOUS

## 2014-06-29 MED ORDER — ENSURE COMPLETE PO LIQD
237.0000 mL | Freq: Three times a day (TID) | ORAL | Status: DC
  Administered 2014-06-29 – 2014-07-10 (×27): 237 mL via ORAL

## 2014-06-29 NOTE — Progress Notes (Signed)
NUTRITION FOLLOW-UP  DOCUMENTATION CODES Per approved criteria  -Severe malnutrition in the context of chronic illness -Underweight   Pt meets criteria for severe malnutrition in the context of chronic illness as evidenced by intake < 75% of estimated energy expenditure for > 1 month, and severe muscle mass and body fat depletion.  INTERVENTION:  Ensure Enlive PO TID, each supplement provides 350 kcal and 20 grams of protein  NUTRITION DIAGNOSIS: Inadequate oral intake related to poor appetite as evidenced by decreased PO intake; ongoing  Goal: Pt to meet >/= 90% of estimated energy needs; progressing.   Monitor:  PO intake, weight status, labs  ASSESSMENT: 59 y/o male with past medical history of DMII and PVD s/p stent placement of right external iliac artery. Presented to [facility] ED with dizziness, cough, diarrhea. Hospitalized 3 weeks ago for syncope and underwent stent placement in occluded right external iliac artery at this time. Ever since discharge, has had diarrhea.   PO intake of meals is variable, consuming 0-100% of meals. He is receiving Ensure between meals BID. RN reports that patient just found out today that he has cancer. Refused breakfast and had 1 Ensure supplement this AM, but did not want his 2 PM Ensure. Patient with increased nutrition needs to prevent further depletion of nutrition stores and promote repletion as able.    Height: Ht Readings from Last 1 Encounters:  06/29/14 5\' 9"  (1.753 m)    Weight: Wt Readings from Last 1 Encounters:  06/29/14 135 lb 2.3 oz (61.3 kg)   06/22/14 130 lb 8.2 oz (59.2 kg)   06/21/14 122 lb 12.7 oz (55.7 kg)            BMI:  Body mass index is 19.95 kg/(m^2).   Estimated Nutritional Needs: Kcal: 1750-1950 kcal Protein: 80-95 grams Fluid: >/= 1.5L daily  Skin: intact  Diet Order: Diet Carb Modified   Intake/Output Summary (Last 24 hours) at 06/29/14 1319 Last data filed at 06/29/14 1004  Gross per 24  hour  Intake   2187 ml  Output   1775 ml  Net    412 ml    Last BM: 3/6  Labs:   Recent Labs Lab 06/25/14 0405  06/26/14 2130 06/27/14 0300 06/28/14 0446 06/29/14 0232  NA 128*  < >  --  135 133* 137  K 3.2*  < >  --  4.1 3.2* 3.5  CL 100  < >  --  105 106 104  CO2 24  < >  --  26 24 27   BUN <5*  < >  --  7 9 11   CREATININE 0.50  < >  --  0.52 0.43* 0.53  CALCIUM 6.7*  < >  --  7.8* 7.6* 7.8*  MG 1.8  --   --  2.1 1.9 2.0  PHOS 2.6  --  2.3  --   --   --   GLUCOSE 112*  < >  --  124* 132* 123*  < > = values in this interval not displayed.  CBG (last 3)   Recent Labs  06/28/14 1626 06/28/14 2128 06/29/14 0748  GLUCAP 192* 140* 122*    Scheduled Meds: . antiseptic oral rinse  7 mL Mouth Rinse q12n4p  . chlorhexidine  15 mL Mouth Rinse BID  . clopidogrel  75 mg Oral Daily  . feeding supplement (ENSURE COMPLETE)  237 mL Oral BID BM  . feeding supplement (GLUCERNA SHAKE)  237 mL Oral TID BM  .  heparin  5,000 Units Subcutaneous 3 times per day  . hydrocortisone sod succinate (SOLU-CORTEF) inj  100 mg Intravenous Q8H  . insulin aspart  0-15 Units Subcutaneous TID WC  . ipratropium-albuterol  3 mL Nebulization QID  . multivitamin with minerals  1 tablet Oral Daily  . piperacillin-tazobactam (ZOSYN)  IV  3.375 g Intravenous Q8H    Continuous Infusions: . sodium chloride 60 mL/hr at 06/29/14 [place], RD, LDN, [place] Pager# [phone] After Hours Pager# [phone]

## 2014-06-29 NOTE — Progress Notes (Signed)
[place] TEAM 1 - Stepdown/ICU TEAM Progress Note  Dustin Freeman BOF:[id number] DOB: 1955-10-29 DOA: 06/20/2014 PCP: No PCP Per Patient  Admit HPI / Brief Narrative: 59 y/o WM PMHx diabetes mellitus type 2, PVD status post stent of right external iliac artery, that presented to ED with dizziness, cough, and diarrhea.  Patient with recent hospitalization for syncope and underwent stent placement in occluded right external iliac artery.  He states he's had the diarrhea since discharge from the hospital on   . In the ED, chest x-ray with lower left lobe consolidation, patient remained hypotensive despite IV fluid, and was thus admitted under [facility] and care.  CCM was reconsulted on 35 CT showed right hilar mass involving the bronchus intermedius and pericardium.  On 310 patient underwent bronchoscopy pending results.   HPI/Subjective: 3/11 A/O 4, although still appears somewhat confused.   Assessment/Plan:  Extensive RLL, RML, and partial RUL postobustructive PNA - LLL infiltrate -CXR with worsening PNA and concern of postobstructive etiology -CT chest -large R hilar mass, Pulm performed thora but cytology not diagnostic, pt informed that is is almost definitely CA.  -Per CCM- Bronchoscopy 3/10- pending results; per Dr. Nelda Marseille [facility] M) right bronchus intermedius completely obstructed not amenable to any type of stenting. -Awaiting oncology recommendations  -Cont Cont emperic broad coverage  -Titrate O2 to maintain SPO2 89-93%  Large soft tissue mass in the right hilum  -obstructs the Rt bronchus intermedius, malignancy suspected - PCCM following- see above. Bronchoscopy 3/10- results pending  Panlobular emphysema  -Stable  Hypovolemic Shock - Sepsis due to PNA - resolved- BP stable -Cortisol normal - Continue stress dose steroids Solu-Cortef 100 mg TID     Hyponatremia  -Possible element of SIADH, but agree w/ Dr. Sherral Hammers that fluid restriction not appropriate - Na has improved w/  volume which does indeed argue against SIADH   Mild hypokalemia -Within normal limits continue to monitor closely   COPD via CXR in known smoker Cont scheduled nebs - no wheezing at this time   Hyperglycemia  -Hgb a1c 6.0 -on steoids, continue mod SSI   Diarrhea  Resolved   Anemia hgb 9.4- stable - chronic disease vs dilutional   PVD s/p stent placement to right external iliac artery 05/15/14 (Dr. Donnetta Hutching) Foot is warm w/ pulse - pt denies complaints - cont Plavix   Severe malnutrition in the context of chronic illness Albumin 1.2 - nutrition consulted   Hypothermia  -Use bare hugger to maintain temperature 36.4C   Code Status: FULL Family Communication: no family present at time of exam Disposition Plan: SDU until recovered from bronchoscopy   Consultants: Dr. Rush Farmer (PCCM)    Procedure/Significant Events:  3/10 Bronchoscopy 3/7 Thoracentesis 3/8- TEE EF 55-60%, Trivial pericardial effusion at RV apex. Inadequate assessment of LV wall 3/11 CT abdomen pelvis with contrast pending  Culture BCx2 3/2 > neg UCx 3/2 > Sputum Cx 3/2 > C. Diff PCR 3/2 > 3/10 pathology/cytology from bronchoscopy pending   Antibiotics: Vanc, start date 3/2   Levaquin, start date 3/2 >>>3/3 Zosyn 3/4>>   DVT prophylaxis:  SCDs   Devices None  LINES / TUBES:  None    Continuous Infusions: . sodium chloride 60 mL/hr at 06/29/14 0233    Objective: VITAL SIGNS: Temp: 97.5 F (36.4 C) (03/11 1909) Temp Source: Oral (03/11 1909) BP: 120/71 mmHg (03/11 1627) Pulse Rate: 57 (03/11 1627) SPO2; FIO2:   Intake/Output Summary (Last 24 hours) at 06/29/14 1912 Last data filed  at 06/29/14 1911  Gross per 24 hour  Intake   2080 ml  Output   2500 ml  Net   -420 ml     Exam: General: A/O 4, acute respiratory distress still occasionally requiring Venturi mask to maintain SPO2 in the low 90s Lungs: poor air movement diffusely. No breath sounds RLL, negative  wheezes or crackles Cardiovascular: Regular rate and rhythm without murmur gallop or rub normal S1 and S2 Abdomen: Nontender, nondistended, soft, bowel sounds positive, no rebound, no ascites, no appreciable mass Extremities: No significant cyanosis, clubbing, or edema bilateral lower extremities  Data Reviewed: Basic Metabolic Panel:  Recent Labs Lab 06/25/14 0405 06/26/14 0254 06/26/14 2130 06/27/14 0300 06/28/14 0446 06/29/14 0232  NA 128* 130*  --  135 133* 137  K 3.2* 4.0  --  4.1 3.2* 3.5  CL 100 103  --  105 106 104  CO2 24 24  --  26 24 27   GLUCOSE 112* 95  --  124* 132* 123*  BUN <5* 5*  --  7 9 11   CREATININE 0.50 0.54  --  0.52 0.43* 0.53  CALCIUM 6.7* 7.1*  --  7.8* 7.6* 7.8*  MG 1.8  --   --  2.1 1.9 2.0  PHOS 2.6  --  2.3  --   --   --    Liver Function Tests:  Recent Labs Lab 06/25/14 0405 06/25/14 1708 06/26/14 0254 06/27/14 0300 06/28/14 0446 06/29/14 0232  AST 31  --  52* 37 27 42*  ALT 23  --  36 32 28 37  ALKPHOS 54  --  51 52 45 43  BILITOT 0.4  --  0.6 0.4 0.4 0.4  PROT 4.4* 4.6* 4.2* 5.0* 4.9* 4.6*  ALBUMIN 1.2*  --  1.2* 2.2* 1.9* 1.9*   No results for input(s): LIPASE, AMYLASE in the last 168 hours. No results for input(s): AMMONIA in the last 168 hours. CBC:  Recent Labs Lab 06/25/14 0405 06/26/14 1504 06/27/14 0300 06/28/14 0446 06/29/14 0232  WBC 12.2* 8.0 12.0* 11.1* 6.7  NEUTROABS  --  6.3 11.3* 9.7* 5.5  HGB 9.2* 9.0* 9.2* 9.7* 9.4*  HCT 26.5* 26.3* 27.4* 28.4* 28.3*  MCV 84.4 86.2 86.4 85.5 86.3  PLT 366 307 309 359 361   Cardiac Enzymes: No results for input(s): CKTOTAL, CKMB, CKMBINDEX, TROPONINI in the last 168 hours. BNP (last 3 results) No results for input(s): BNP in the last 8760 hours.  ProBNP (last 3 results) No results for input(s): PROBNP in the last 8760 hours.  CBG:  Recent Labs Lab 06/28/14 1626 06/28/14 2128 06/29/14 0748 06/29/14 1233 06/29/14 1629  GLUCAP 192* 140* 122* 188* 259*     Recent Results (from the past 240 hour(s))  Blood Culture (routine x 2)     Status: None   Collection Time: 06/20/14  1:49 PM  Result Value Ref Range Status   Specimen Description BLOOD ARM RIGHT  Final   Special Requests BOTTLES DRAWN AEROBIC AND ANAEROBIC 5CC  Final   Culture   Final    NO GROWTH 5 DAYS Performed at [facility]    Report Status 06/26/2014 FINAL  Final  Urine culture     Status: None   Collection Time: 06/20/14  2:12 PM  Result Value Ref Range Status   Specimen Description URINE, RANDOM  Final   Special Requests NONE  Final   Colony Count NO GROWTH Performed at [facility]   Final  Culture NO GROWTH Performed at [facility]   Final   Report Status 06/21/2014 FINAL  Final  Blood Culture (routine x 2)     Status: None   Collection Time: 06/20/14  2:49 PM  Result Value Ref Range Status   Specimen Description BLOOD RIGHT FOREARM  Final   Special Requests BOTTLES DRAWN AEROBIC AND ANAEROBIC 4CC  Final   Culture   Final    NO GROWTH 5 DAYS Performed at [facility]    Report Status 06/26/2014 FINAL  Final  MRSA PCR Screening     Status: None   Collection Time: 06/20/14  5:20 PM  Result Value Ref Range Status   MRSA by PCR NEGATIVE NEGATIVE Final    Comment:        The GeneXpert MRSA Assay (FDA approved for NASAL specimens only), is one component of a comprehensive MRSA colonization surveillance program. It is not intended to diagnose MRSA infection nor to guide or monitor treatment for MRSA infections.   Culture, expectorated sputum-assessment     Status: None   Collection Time: 06/21/14  3:39 PM  Result Value Ref Range Status   Specimen Description SPUTUM  Final   Special Requests NONE  Final   Sputum evaluation   Final    MICROSCOPIC FINDINGS SUGGEST THAT THIS SPECIMEN IS NOT REPRESENTATIVE OF LOWER RESPIRATORY SECRETIONS. PLEASE RECOLLECT. Avis Epley RN [id number] 06/21/14 A BROWNING    Report Status  06/21/2014 FINAL  Final  Body fluid culture     Status: None   Collection Time: 06/25/14  3:15 PM  Result Value Ref Range Status   Specimen Description FLUID RIGHT PLEURAL  Final   Special Requests Normal  Final   Gram Stain   Final    FEW WBC PRESENT, PREDOMINANTLY PMN NO ORGANISMS SEEN Performed at [facility]    Culture   Final    NO GROWTH 3 DAYS Performed at [facility]    Report Status 06/29/2014 FINAL  Final  AFB culture with smear     Status: None (Preliminary result)   Collection Time: 06/25/14  3:15 PM  Result Value Ref Range Status   Specimen Description FLUID RIGHT PLEURAL  Final   Special Requests Normal  Final   Acid Fast Smear   Final    NO ACID FAST BACILLI SEEN Performed at [facility]    Culture   Final    CULTURE WILL BE EXAMINED FOR 6 WEEKS BEFORE ISSUING A FINAL REPORT Performed at [facility]    Report Status PENDING  Incomplete  Fungal stain     Status: None   Collection Time: 06/25/14  3:15 PM  Result Value Ref Range Status   Specimen Description FLUID RIGHT PLEURAL  Final   Special Requests Normal  Final   Fungal Smear   Final    NO YEAST OR FUNGAL ELEMENTS SEEN Performed at [facility]    Report Status 06/26/2014 FINAL  Final  AFB culture with smear     Status: None (Preliminary result)   Collection Time: 06/28/14 11:19 AM  Result Value Ref Range Status   Specimen Description BRONCHIAL ALVEOLAR LAVAGE  Final   Special Requests Normal  Final   Acid Fast Smear   Final    NO ACID FAST BACILLI SEEN Performed at [facility]    Culture   Final    CULTURE WILL BE EXAMINED FOR 6 WEEKS BEFORE ISSUING A FINAL REPORT Performed  at [facility]    Report Status PENDING  Incomplete  Fungus Culture with Smear     Status: None (Preliminary result)   Collection Time: 06/28/14 11:19 AM  Result Value Ref Range Status   Specimen Description BRONCHIAL ALVEOLAR LAVAGE  Final   Special Requests  Normal  Final   Fungal Smear   Final    NO YEAST OR FUNGAL ELEMENTS SEEN Performed at [facility]    Culture   Final    CULTURE IN PROGRESS FOR FOUR WEEKS Performed at [facility]    Report Status PENDING  Incomplete  Culture, bal-quantitative     Status: None (Preliminary result)   Collection Time: 06/28/14 11:19 AM  Result Value Ref Range Status   Specimen Description BRONCHIAL ALVEOLAR LAVAGE  Final   Special Requests NONE  Final   Gram Stain   Final    FEW WBC PRESENT,BOTH PMN AND MONONUCLEAR RARE SQUAMOUS EPITHELIAL CELLS PRESENT NO ORGANISMS SEEN Performed at [place] PENDING  Incomplete   Culture   Final    Culture reincubated for better growth Performed at [facility]    Report Status PENDING  Incomplete     Studies:  Recent x-ray studies have been reviewed in detail by the Attending Physician  Scheduled Meds:  Scheduled Meds: . antiseptic oral rinse  7 mL Mouth Rinse q12n4p  . chlorhexidine  15 mL Mouth Rinse BID  . clopidogrel  75 mg Oral Daily  . feeding supplement (ENSURE COMPLETE)  237 mL Oral TID BM  . heparin  5,000 Units Subcutaneous 3 times per day  . hydrocortisone sod succinate (SOLU-CORTEF) inj  100 mg Intravenous Q8H  . insulin aspart  0-15 Units Subcutaneous TID WC  . ipratropium-albuterol  3 mL Nebulization QID  . multivitamin with minerals  1 tablet Oral Daily  . piperacillin-tazobactam (ZOSYN)  IV  3.375 g Intravenous Q8H    Time spent on care of this patient: 40 mins   Makena Murdock, Geraldo Docker ,MD [facility] Office  [phone] Pager - [phone]  On-Call/Text Page:      [email or web address]      password [email or web address]  If 7PM-7AM, please contact night-coverage [email or web address] Password [email or web address] 06/29/2014, 7:12 PM   LOS: 9 days   Care during the described time interval was provided by me . I have reviewed this patient's available data, including medical history, events of note, physical  examination, radiology studies and test results as part of my evaluation  Dia Crawford, MD [phone] Pager

## 2014-06-29 NOTE — Clinical Social Work Note (Signed)
CSW reviewed chart. Pt is not medically stable to discharge. CSW will contiune to follow, provided support and assist to with approriate needs of care upon discharge.   [place], MSW, [place]

## 2014-06-29 NOTE — Progress Notes (Signed)
PULMONARY / CRITICAL CARE MEDICINE   Name: Dustin Freeman MRN: [id number] DOB: 05/20/1955    ADMISSION DATE:  06/20/2014 CONSULTATION DATE:  06/29/2014  REFERRING MD :  EDP  CHIEF COMPLAINT:  Dizziness, cough, diarrhea  INITIAL PRESENTATION:  59 y/o M w/ PMHx of DM type II and PVD s/p stent placement of right external iliac artery, brought to [facility] ED 3/2 for dizziness, cough, diarrhea. Hospitalized 3 weeks ago for syncope and underwent stent placement in occluded right external iliac artery at that time.  Ever since being discharged, has had diarrhea.  In ED, CXR c/w RLL consolidation and patient remained hypotensive despite 4L IVF.  PCCM consulted for admission.  Asked to see patient 3/5 because CT chest shows R hilar mass involving Bronchus Intermedius and pericardium. Biopsy consideration requested.  STUDIES:  CXR 3/2 >>> RLL consolidation CT chest 06/22/14  SIGNIFICANT EVENTS: 1/24 - 1/27 - admitted for syncope.  Found to have PVD with RLE claudication due to occluded right external iliac artery.  Underwent stent placement 3/2 - admit with HCAP 3/4- CT R hilar mass, pneumonia, bilat effusion  SUBJECTIVE:  Cough only producing clear sputum.  VITAL SIGNS: Temp:  [96.7 F (35.9 C)-97.5 F (36.4 C)] 97.4 F (36.3 C) (03/11 1239) Pulse Rate:  [62-81] 76 (03/11 1239) Resp:  [17-20] 18 (03/11 1239) BP: (108-139)/(61-75) 128/64 mmHg (03/11 1239) SpO2:  [89 %-100 %] 97 % (03/11 1239) FiO2 (%):  [40 %] 40 % (03/10 1621) Weight:  [61.3 kg (135 lb 2.3 oz)] 61.3 kg (135 lb 2.3 oz) (03/11 0403)   HEMODYNAMICS:   VENTILATOR SETTINGS: Vent Mode:  [-]  FiO2 (%):  [40 %] 40 % INTAKE / OUTPUT: Intake/Output      03/10 0701 - 03/11 0700 03/11 0701 - 03/12 0700   P.O. 480 0   I.V. (mL/kg) 1457 (23.8)    IV Piggyback 250    Total Intake(mL/kg) 2187 (35.7) 0 (0)   Urine (mL/kg/hr) 900 (0.6) 975 (2.6)   Stool  0 (0)   Total Output 900 975   Net +1287 -975        Urine Occurrence 1 x       PHYSICAL EXAMINATION: General: Frail adult male, in NAD. Intermittent cough.  Neuro: Awake A&O x 3, No focal neuro deficits.  HEENT: PERRL, EOMI, sclerae anicteric. Cardiovascular: RRR, no M/R/G.  Lungs: Respirations even and unlabored.  Coarse crackles RLL. Abdomen: BS x 4, soft, NT/ND.  Musculoskeletal: No gross deformities, no edema.  Skin: Intact, warm, no rashes.  LABS:  CBC  Recent Labs Lab 06/27/14 0300 06/28/14 0446 06/29/14 0232  WBC 12.0* 11.1* 6.7  HGB 9.2* 9.7* 9.4*  HCT 27.4* 28.4* 28.3*  PLT 309 359 361   BMET  Recent Labs Lab 06/27/14 0300 06/28/14 0446 06/29/14 0232  NA 135 133* 137  K 4.1 3.2* 3.5  CL 105 106 104  CO2 26 24 27   BUN 7 9 11   CREATININE 0.52 0.43* 0.53  GLUCOSE 124* 132* 123*   Electrolytes  Recent Labs Lab 06/25/14 0405  06/26/14 2130 06/27/14 0300 06/28/14 0446 06/29/14 0232  CALCIUM 6.7*  < >  --  7.8* 7.6* 7.8*  MG 1.8  --   --  2.1 1.9 2.0  PHOS 2.6  --  2.3  --   --   --   < > = values in this interval not displayed. Sepsis Markers No results for input(s): LATICACIDVEN, PROCALCITON, O2SATVEN in the last 168 hours.  Liver Enzymes  Recent Labs Lab 06/27/14 0300 06/28/14 0446 06/29/14 0232  AST 37 27 42*  ALT 32 28 37  ALKPHOS 52 45 43  BILITOT 0.4 0.4 0.4  ALBUMIN 2.2* 1.9* 1.9*    Cardiac Enzymes No results for input(s): TROPONINI, PROBNP in the last 168 hours. Glucose  Recent Labs Lab 06/27/14 2204 06/28/14 0735 06/28/14 1205 06/28/14 1626 06/28/14 2128 06/29/14 0748  GLUCAP 127* 133* 124* 192* 140* 122*    Imaging No results found. CT chest- 3/5- I reviewed images and agree with interpretation. Bibasilar pneumonia and effusions, R hilar mass appears to involve bronchus intermedius and pericardium.  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 He is very weak, but within a few days he may be strong enough to tolerate bronch seeking  endobronchial tumor or possible TBNA in R mainstem. Concern that tumor in pericardium may lead to tamponade. Consider thoracentesis R for decompression, cytology and cultures, but may only be inflammatory. Sputum cx non-diagnostic  PULMONARY A: HCAP rt base, aspiration? COPD by CXR - has never had PFT's Tobacco use disorder Thora done 3/7 with >1 L of fluid removed, appears borderline (LDH ratio of 0.65). P:   Pulmonary hygiene. Tobacco cessation efforts. Abx - now on Zosyn. Continue bronchodilators for now. Titrate O2 for sat of 88-92%. Biopsy results with squamous cell carcinoma. H/O consult.  CARDIOVASCULAR A:  Shock- hypovolemic (diarrhea, poor po intake) + early sepsis 2/2 HCAP PVD s/p stent placement to right external iliac artery 05/15/14 (Dr. Donnetta Hutching) P:  Per primary team. Don't see significant pericardial effusion yet.  RENAL A:   Hyponatremia Hypokalemia Mild Non-AG Acidosis; likely 2/2 volume repletion  P:   Saline maintenance Replace K as needed  GASTROINTESTINAL A:   Protein calorie malnutrition Nutrition malnurished Diarrhea resolved? at risk mesenteric ischemia, has no abdo pain P:   Per primary team  HEMATOLOGIC A:   Anemia; chronic disease vs dilutional Leukocytosis persists VTE Prophylaxis P:  Transfuse for Hgb < 7. SCD's / Heparin [place] May restart [place] heparin.  INFECTIOUS A:   HCAP; BLL infiltrate on CXR R/o C. Diff P:   BCx2 3/2 > neg UCx 3/2 > Sputum Cx 3/2 > C. Diff PCR 3/2 > Abx: Vanc, start date 3/2  Abx: Levaquin, start date 3/2 >>>3/3 Zosyn 3/4>>  Squamous cell carcinoma as biopsy result.  I spoke with H/O and informed them of results and discussed the options.  Location likely precludes surgery but would recommend radiation given the fact that the mass is obstructing the bronchus intermedius.  I spoke with the patient extensively and informed him the results.  At this point further recommendations per H/O.  PCCM will sign off,  please call back if needed and arrange f/u with [facility] as outpatient.  Rush Farmer, M.D. [facility]. Pager: [phone]. After hours pager: [phone].  06/29/2014 1:05 PM

## 2014-06-29 NOTE — Consult Note (Signed)
[facility]  Telephone:(336) [place] NOTE  Dustin Freeman                                MR#: [id number]  DOB: [birth year]                              CSN#: [id number]  Referring MD: Dr. Sheliah [facility]  Primary MD: Dr. Sherren Mocha  Reason for Consult: Lung Mass   BDZ:HGDJME Dustin Freeman is a 59 y.o. male Smoker,admitted on 06/21/2014 With hypovolemic shock with syncope and due to diarrhea, dehydration, as well as shortness of breath complicated with HCAP. During this prolonged hospitalization, a CT of the chest on 06/23/2014 demonstrating a large right hilar mass which appears to involve the bronchus intermedius and pericardium. He underwent thoracentesis on 06/25/2014 yielding 1 L of fluid which was sent for cytology, which was negative for malignancy. He also underwent bronchoscopy on 06/28/2014, which results are consistent with Squamous Cell Carcinoma of the lung. During that procedure it was noted that his right bronchus intermedius was completely obstructive, not amenable to any type of stenting. He is feeling better this morning, less short of breath. No hemoptysis. Denies any cardiac complaints. Denies any GI or GU symptoms. No confusion is reported. He has a history of claudication.  Anticipating that the patient has malignancy, oncology has been asked to see the patient in consultation, with recommendations.       PMH:  Past Medical History  Diagnosis Date   History of palpitations     During [employer] intake exam, not since   DM (diabetes mellitus)    PVD (peripheral vascular disease)     s/p stent to right external iliac artery (05/15/14 - Dr. Donnetta Hutching)   Collagen vascular disease     Surgeries:  Past Surgical History  Procedure Laterality Date   Lower extremity angiogram N/A 05/15/2014    Procedure: LOWER EXTREMITY ANGIOGRAM;  Surgeon: Serafina Mitchell, MD;  Location: [facility] CATH LAB;  Service: Cardiovascular;  Laterality: N/A;     Video bronchoscopy Bilateral 06/28/2014    Procedure: VIDEO BRONCHOSCOPY WITHOUT FLUORO;  Surgeon: Rush Farmer, MD;  Location: [facility] ENDOSCOPY;  Service: Endoscopy;  Laterality: Bilateral;    Allergies: No Known Allergies  Medications:   Scheduled Meds:  antiseptic oral rinse  7 mL Mouth Rinse q12n4p   chlorhexidine  15 mL Mouth Rinse BID   clopidogrel  75 mg Oral Daily   feeding supplement (ENSURE COMPLETE)  237 mL Oral BID BM   feeding supplement (GLUCERNA SHAKE)  237 mL Oral TID BM   heparin  5,000 Units Subcutaneous 3 times per day   hydrocortisone sod succinate (SOLU-CORTEF) inj  100 mg Intravenous Q8H   insulin aspart  0-15 Units Subcutaneous TID WC   ipratropium-albuterol  3 mL Nebulization QID   multivitamin with minerals  1 tablet Oral Daily   piperacillin-tazobactam (ZOSYN)  IV  3.375 g Intravenous Q8H   Continuous Infusions:  sodium chloride 60 mL/hr at 06/29/14 0233   PRN Meds:.acetaminophen, acetaminophen-codeine, chlorpheniramine-HYDROcodone, fentaNYL, lidocaine (PF), lidocaine, LORazepam, morphine injection, oxyCODONE, phenylephrine   ROS: See HPI for significant positives, rest or ROS negative  Family History:    Family History  Problem Relation Age of Onset   Diabetes Mellitus I Mother    Peripheral vascular disease  Father    Leukemia Maternal Grandfather    Leukemia Maternal Uncle    Kidney disease Cousin    Breast cancer Cousin   also father had pne  No family history of hematological  disorders.  Social History:  reports that he quit smoking about 5 weeks ago. His smoking use included Cigarettes. He has a 23.5 pack-year smoking history. He does not have any smokeless tobacco history on file. He reports that he does not drink alcohol or use illicit drugs. Works at the [employer]:   06/29/14 0749  BP: 139/68  Pulse: 62  Temp: 97.5 F (36.4 C)  Resp: 20   Filed Weights   06/27/14 0500 06/28/14  0346 06/29/14 0403  Weight: 134 lb 11.2 oz (61.1 kg) 134 lb 11.2 oz (61.1 kg) 135 lb 2.3 oz (61.3 kg)    GENERAL:alert, no distress and comfortable, chronically ill appearing.  SKIN: skin color, texture, turgor are normal, no rashes or significant lesions EYES: normal, conjunctiva are pink and non-injected, sclera clear OROPHARYNX: no exudate, no erythema and lips, buccal mucosa, and tongue normal  NECK: supple, thyroid normal size, non-tender, without nodularity LYMPH:  no palpable lymphadenopathy in the cervical, axillary or inguinal LUNGS: right lower lobe rales and rhonchi, no wheezes. Normal breathing effort HEART: regular rate & rhythm and no murmurs and no lower extremity edema ABDOMEN:abdomen soft, non-tender and normal bowel sounds Musculoskeletal:no cyanosis of digits and no clubbing  PSYCH: alert & oriented x 3 with fluent speech NEURO: no focal motor/sensory deficits  CBC  Recent Labs Lab 06/25/14 0405 06/26/14 1504 06/27/14 0300 06/28/14 0446 06/29/14 0232  WBC 12.2* 8.0 12.0* 11.1* 6.7  HGB 9.2* 9.0* 9.2* 9.7* 9.4*  HCT 26.5* 26.3* 27.4* 28.4* 28.3*  PLT 366 307 309 359 361  MCV 84.4 86.2 86.4 85.5 86.3  MCH 29.3 29.5 29.0 29.2 28.7  MCHC 34.7 34.2 33.6 34.2 33.2  RDW 14.5 14.8 15.0 15.0 15.2  LYMPHSABS  --  1.2 0.6* 1.1 1.0  MONOABS  --  0.4 0.1 0.3 0.3  EOSABS  --  0.1 0.0 0.0 0.0  BASOSABS  --  0.0 0.0 0.0 0.0    Anemia panel:  No results for input(s): VITAMINB12, FOLATE, FERRITIN, TIBC, IRON, RETICCTPCT in the last 72 hours.  CMP    Recent Labs Lab 06/25/14 0405 06/26/14 0254 06/27/14 0300 06/28/14 0446 06/29/14 0232  NA 128* 130* 135 133* 137  K 3.2* 4.0 4.1 3.2* 3.5  CL 100 103 105 106 104  CO2 24 24 26 24 27   GLUCOSE 112* 95 124* 132* 123*  BUN <5* 5* 7 9 11   CREATININE 0.50 0.54 0.52 0.43* 0.53  CALCIUM 6.7* 7.1* 7.8* 7.6* 7.8*  MG 1.8  --  2.1 1.9 2.0  AST 31 52* 37 27 42*  ALT 23 36 32 28 37  ALKPHOS 54 51 52 45 43  BILITOT 0.4  0.6 0.4 0.4 0.4        Component Value Date/Time   BILITOT 0.4 06/29/2014 0232      Recent Labs Lab 06/24/14 1350  INR 1.21    No results for input(s): DDIMER in the last 72 hours.  Imaging Studies:  Ct Chest W Contrast  06/22/2014   CLINICAL DATA:  Peripheral vascular disease cardiac palpitations diabetes with cough and congestion currently, patient smokes, patient is homeless  EXAM: CT CHEST WITH CONTRAST  TECHNIQUE: Multidetector CT imaging of the chest was performed  during intravenous contrast administration.  CONTRAST:  59mL OMNIPAQUE IOHEXOL 300 MG/ML  SOLN  COMPARISON:  06/21/2014  FINDINGS: There is extensive infiltrate throughout the right lower lobe. The right lower lobe is densely consolidated. Infiltrate extends into the posterior aspects of the right upper lobe and throughout much of the right middle lobe, which is less severely involved the lower lobe. There is abnormal soft tissue in the right hilum measuring about 9 cm anterior to posterior by 3 cm left right seen best on image number 45. This extends from the superior hilum inferiorly to the level of the coronary sinus. This soft tissue is inseparable from the pericardium on the right, and causes mass effect on the bronchus intermedius, which is not visible as a result.  On the left there is mild to moderate deep tendon infiltrate in the lower lobe. There are small bilateral pleural effusions. There is mild pericardial thickening/ fluid. There is a 10 mm subcarinal lymph node.  There is coronary artery calcification. Minimal aortic calcifications is present. Thoracic inlet is normal. There are no acute musculoskeletal findings.  IMPRESSION: Severe post obstructive pneumonia right lower lobe with extension of pneumonia into the right middle lobe and right upper lobe.  Large soft tissue mass in the right hilum obstructs the bronchus intermedius. Malignancy suspected  There are bilateral pleural effusions. There is infiltrate in  the left lower lobe which, although dependent in position, is concerning for pneumonia as well. Significant atelectasis is an alternative consideration.   Electronically Signed   By: Skipper Cliche M.D.   On: 06/22/2014 17:53   Dg Chest Port 1 View  06/25/2014   CLINICAL DATA:  Post thoracentesis.  EXAM: PORTABLE CHEST - 1 VIEW  COMPARISON:  06/21/2014  FINDINGS: Bilateral lower lobe airspace opacities are noted, increasing on the left since prior study. Suspect layering effusions. , increasing on the left as well since prior study. No pneumothorax. There is a lying superimposed over the left upper lobe which simulates a pneumothorax, but lung markings are noted lateral to this line compatible with artifact.  Heart is borderline in size.  No acute bony abnormality.  IMPRESSION: Bilateral lower lobe airspace opacities and layering effusions, both worsening on the left since prior study. No pneumothorax.   Electronically Signed   By: Rolm Baptise M.D.   On: 06/25/2014 15:51   Dg Chest Port 1 View  06/21/2014   CLINICAL DATA:  Hypoxia.  Respiratory distress.  Initial encounter.  EXAM: PORTABLE CHEST - 1 VIEW  COMPARISON:  Radiographs ranging from 05/13/2014 through 06/21/2014.  FINDINGS: 1642 hours. There has been further slight worsening of the right infrahilar airspace disease, most consistent with right middle and lower lobe pneumonia. This could be on a post obstructive basis. There is minimal patchy opacity in the left lower lobe. No edema, pneumothorax or significant pleural effusion identified. The heart size is stable. Multiple telemetry leads overlie the chest.  IMPRESSION: Continue slight worsening of right basilar pneumonia, possibly postobstructive in etiology based on prior studies.   Electronically Signed   By: Richardean Sale M.D.   On: 06/21/2014 16:57   Dg Chest Port 1 View  06/21/2014   CLINICAL DATA:  Hospital acquired pneumonia  EXAM: PORTABLE CHEST - 1 VIEW  COMPARISON:  Chest x-ray of  06/20/2014  FINDINGS: There has been worsening of opacity in the right mid lung and right lung base most consistent with pneumonia. Small lucencies within the right lung base could represent areas of cavitation  and followup chest x-ray or CT chest is recommended. The lungs are somewhat hyperaerated suggesting emphysema. The heart is mildly enlarged. No bony abnormality is seen.  IMPRESSION: 1. Worsening of infiltrate in the right lung base most consistent with pneumonia. Small areas a cavitation cannot be excluded within this focus of pneumonia. Consider followup chest x-ray or CT chest. 2. Hyperaeration consistent with emphysema.   Electronically Signed   By: Ivar Drape M.D.   On: 06/21/2014 07:23   Dg Abd Acute W/chest  06/20/2014   CLINICAL DATA:  Right upper quadrant pain, cough for weeks.  EXAM: ACUTE ABDOMEN SERIES (ABDOMEN 2 VIEW & CHEST 1 VIEW)  COMPARISON:  05/13/2014  FINDINGS: New airspace opacity noted in the right lower lobe concerning for pneumonia. Underlying COPD. Heart is normal size. Left lung is clear. No effusions.  Mild diffuse gaseous distention of the colon. No evidence of bowel obstruction. No organomegaly or suspicious calcification.  IMPRESSION: Right lower lobe consolidation compatible with pneumonia.  COPD.   Electronically Signed   By: Rolm Baptise M.D.   On: 06/20/2014 13:11    Patient: Dustin Freeman, Dustin Freeman Collected: 06/28/2014 Client: [place] Accession: [id number] Received: 06/28/2014 Jennet Maduro DOB: 1955/04/30 Age: 1 Gender: M Reported: 06/29/2014 [address] Patient Ph: [phone] MRN #: [id number] [place], [place] [address] Visit #: [id number].[place]-[facility]0 Chart #: Phone:  Fax: CC: REPORT OF SURGICAL PATHOLOGY FINAL DIAGNOSIS Diagnosis Bronchus, biopsy, bronchus intermedius - SQUAMOUS CELL CARCINOMA. - SEE COMMENT. Microscopic Comment The carcinoma appears poorly differentiated. Dr. Claudette Laws has reviewed the case and concurs with this  interpretation. The case was discussed with Dr. Nelda Marseille on 06/29/2014. There may be sufficient tissue remaining for molecular studies, if requested. Enid Cutter MD Pathologist, Electronic Signature (Case signed 06/29/2014) Specimen Gross and Clinical Information Specimen(s) Obtained: Bronchus, biopsy, bronchus intermedius Specimen Clinical Information (cm) Gross The specimen is received in formalin and consists of multiple pieces of tan-white, focally hemorrhagic soft tissue ranging from less than 0.1 to 0.2 cm. The specimen is entirely submitted in one cassette. (KL:ecj 06/28/2014) Report signed out from the following location(s) Technical component and interpretation was performed at [facility], [place], [place] [address]. CLIA #: [id number], 1 of 1   Assessment/Plan: 59 y.o.   Large Right Hilar Mass involving BI and pericardium Bilateral Pleural effusion These findings are suspicious for malignancy in a patient with tobacco history He underwent thoracentesis on 06/25/2014 yielding 1 L of fluid which was sent for cytology.  He also underwent bronchoscopy on 06/28/2014, which results consistent with Squamous Cell Carcinoma of the lung.  Recommend Radiation oncology consult for palliative radiation to the area Consider staging film completion with CT of the abdomen and pelvis to rule out occult metastatic disease. Last CT and MRI of the brain on 1/25 were negative for metastatic disease.   Anemia in neoplastic disease Due to malignancy, malnutrition, dilution, infection, antibiotic, prolonged hospitalization with multiple blood draws.  Last Iron studies in January of 2016 were normal.  No transfusion is indicated at this time Monitor counts closely Transfuse blood to maintain a Hb of 8 g or if the patient is acutely bleeding  Malnutrition Appreciate Nutrition follow up  History of tobacco abuse Tobacco cessation program recommended prior to  discharge.   Full Code   Other medical issues as per admitting team     [place], PA-C 06/29/2014 10:54 AM  ADDENDUM:  I saw and examined the patient. 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 He has obstruction of bronchus intermedius.  He has bilateral pleural effusions. I probably would recommend evaluating these for malignancy. Clearly, if the left effusion is malignant, then he would be upstaged to stage IV.  I talked him at length. I told him that we could treat this but I would doubt that this can be curable.  He is not in the best shape. He has a low albumin. I will check his prealbumin.  I think we want to try to help him most, I think the best ways with combined radiation and chemotherapy. As such, I would recommend a radiation oncology consult next week.  As far as chemotherapy is concerned, I would probably use low-dose chemotherapy with the radiation.  He will need a Port-A-Cath if we are to give chemotherapy.  He probably will need to be moved over to [facility] next week. We can consult on him over there. We can administer the radiation and chemotherapy  He understands the situation. I spent a good hour with him. Again, he is not in the best shape. His performance status is probably ECOG 2 at best.  I don't think that he will ever get better until this postobstructive pneumonia improves.  I appreciate the opportunity to have seen him. He is very nice. He is very grateful for all the care that he is received.  Dustin Freeman

## 2014-06-30 ENCOUNTER — Inpatient Hospital Stay (HOSPITAL_COMMUNITY): Payer: Medicaid Other

## 2014-06-30 DIAGNOSIS — C3491 Malignant neoplasm of unspecified part of right bronchus or lung: Secondary | ICD-10-CM

## 2014-06-30 LAB — COMPREHENSIVE METABOLIC PANEL
ALT: 42 U/L (ref 0–53)
ANION GAP: 8 (ref 5–15)
AST: 39 U/L — AB (ref 0–37)
Albumin: 1.9 g/dL — ABNORMAL LOW (ref 3.5–5.2)
Alkaline Phosphatase: 47 U/L (ref 39–117)
BUN: 9 mg/dL (ref 6–23)
CALCIUM: 7.9 mg/dL — AB (ref 8.4–10.5)
CO2: 27 mmol/L (ref 19–32)
Chloride: 101 mmol/L (ref 96–112)
Creatinine, Ser: 0.51 mg/dL (ref 0.50–1.35)
GFR calc Af Amer: >90 mL/min (ref >90)
GFR calc non Af Amer: >90 mL/min (ref >90)
GLUCOSE: 114 mg/dL — AB (ref 70–99)
Potassium: 2.7 mmol/L — CL (ref 3.5–5.1)
Sodium: 136 mmol/L (ref 135–145)
TOTAL PROTEIN: 4.7 g/dL — AB (ref 6.0–8.3)
Total Bilirubin: 0.5 mg/dL (ref 0.3–1.2)

## 2014-06-30 LAB — CBC WITH DIFFERENTIAL/PLATELET
Basophils Absolute: 0 10*3/uL (ref 0.0–0.1)
Basophils Relative: 0 % (ref 0–1)
EOS PCT: 0 % (ref 0–5)
Eosinophils Absolute: 0 10*3/uL (ref 0.0–0.7)
HCT: 28.9 % — ABNORMAL LOW (ref 39.0–52.0)
Hemoglobin: 9.9 g/dL — ABNORMAL LOW (ref 13.0–17.0)
LYMPHS ABS: 1.1 10*3/uL (ref 0.7–4.0)
Lymphocytes Relative: 15 % (ref 12–46)
MCH: 29.4 pg (ref 26.0–34.0)
MCHC: 34.3 g/dL (ref 30.0–36.0)
MCV: 85.8 fL (ref 78.0–100.0)
Monocytes Absolute: 0.3 10*3/uL (ref 0.1–1.0)
Monocytes Relative: 4 % (ref 3–12)
NEUTROS ABS: 6.4 10*3/uL (ref 1.7–7.7)
Neutrophils Relative %: 81 % — ABNORMAL HIGH (ref 43–77)
PLATELETS: 400 10*3/uL (ref 150–400)
RBC: 3.37 MIL/uL — AB (ref 4.22–5.81)
RDW: 15.4 % (ref 11.5–15.5)
WBC: 7.9 10*3/uL (ref 4.0–10.5)

## 2014-06-30 LAB — CULTURE, BAL-QUANTITATIVE W GRAM STAIN: Colony Count: 25000

## 2014-06-30 LAB — CULTURE, BAL-QUANTITATIVE

## 2014-06-30 LAB — GLUCOSE, CAPILLARY
GLUCOSE-CAPILLARY: 113 mg/dL — AB (ref 70–99)
GLUCOSE-CAPILLARY: 138 mg/dL — AB (ref 70–99)
Glucose-Capillary: 121 mg/dL — ABNORMAL HIGH (ref 70–99)
Glucose-Capillary: 126 mg/dL — ABNORMAL HIGH (ref 70–99)

## 2014-06-30 LAB — PREALBUMIN: Prealbumin: 13.4 mg/dL — ABNORMAL LOW (ref 17.0–34.0)

## 2014-06-30 LAB — MAGNESIUM: MAGNESIUM: 2 mg/dL (ref 1.5–2.5)

## 2014-06-30 MED ORDER — LIDOCAINE HCL (PF) 1 % IJ SOLN
INTRAMUSCULAR | Status: AC
  Filled 2014-06-30: qty 10

## 2014-06-30 MED ORDER — HYDROCORTISONE NA SUCCINATE PF 100 MG IJ SOLR
50.0000 mg | Freq: Three times a day (TID) | INTRAMUSCULAR | Status: DC
Start: 2014-06-30 — End: 2014-07-01
  Administered 2014-06-30 – 2014-07-01 (×3): 50 mg via INTRAVENOUS
  Filled 2014-06-30 (×5): qty 1

## 2014-06-30 MED ORDER — DRONABINOL 2.5 MG PO CAPS
5.0000 mg | ORAL_CAPSULE | Freq: Two times a day (BID) | ORAL | Status: DC
  Administered 2014-06-30 – 2014-07-10 (×20): 5 mg via ORAL
  Filled 2014-06-30 (×20): qty 2

## 2014-06-30 MED ORDER — IPRATROPIUM-ALBUTEROL 0.5-2.5 (3) MG/3ML IN SOLN: 3.0000 mL | Freq: Four times a day (QID) | RESPIRATORY_TRACT | Status: DC | PRN

## 2014-06-30 MED ORDER — POTASSIUM CHLORIDE CRYS ER 20 MEQ PO TBCR
30.0000 meq | EXTENDED_RELEASE_TABLET | ORAL | Status: AC
  Administered 2014-06-30 (×3): 30 meq via ORAL
  Filled 2014-06-30 (×5): qty 1

## 2014-06-30 NOTE — Procedures (Signed)
Successful US guided left sided thoracentesis with 660 cc of serous pleural fluid aspirated.  Samples sent to lab for analysis.  No immediate complications.

## 2014-06-30 NOTE — Progress Notes (Signed)
[place] TEAM 1 - Stepdown/ICU TEAM Progress Note  Dustin Freeman FVC:[id number] DOB: 1955/08/11 DOA: 06/20/2014 PCP: No PCP Per Patient  Admit HPI / Brief Narrative: 59 y/o WM PMHx diabetes mellitus type 2, PVD status post stent of right external iliac artery, that presented to ED with dizziness, cough, and diarrhea.  Patient with recent hospitalization for syncope and underwent stent placement in occluded right external iliac artery.  He states he's had the diarrhea since discharge from the hospital on   . In the ED, chest x-ray with lower left lobe consolidation, patient remained hypotensive despite IV fluid, and was thus admitted under [facility] and care.  CCM was reconsulted on 35 CT showed right hilar mass involving the bronchus intermedius and pericardium.  On 310 patient underwent bronchoscopy pending results.   HPI/Subjective: 3/12 A/O 4, patient understands that he has squamous cell carcinoma and that he will be transferred to [facility] long hospital next week.   Assessment/Plan:  Extensive RLL, RML, and partial RUL postobustructive PNA - LLL infiltrate -CXR with worsening PNA and concern of postobstructive etiology -CT chest -large R hilar mass, Pulm performed thora but cytology not diagnostic, pt informed that is is almost definitely CA.  -Per CCM- Bronchoscopy 3/10- pending results; per Dr. Nelda Marseille [facility] M) right bronchus intermedius completely obstructed not amenable to any type of stenting. -Cont Cont emperic broad coverage  -Titrate O2 to maintain SPO2 89-93%  Large soft tissue mass in the right hilum  -obstructs the Rt bronchus intermedius, (squamous cell carcinoma) - PCCM following- see above. Bronchoscopy 3/10- results pending  SQUAMOUS CELL CARCINOMA. -Patient will be transferred to [facility] long hospital next week for radiation+ low-dose chemotherapy per Dr.Peter Oletha Cruel, (oncology)  Panlobular emphysema  -Stable  Hypovolemic Shock - Sepsis due to PNA - resolved- BP  stable -Cortisol normal - Begin to titrate Stress dose steroids; decrease Solu-Cortef 50 mg TID     Hyponatremia  -Possible element of SIADH, but agree w/ Dr. Sherral Hammers that fluid restriction not appropriate - Na has improved w/ volume which does indeed argue against SIADH   Hypokalemia -K-Dur 30 mEq TID   COPD via CXR in known smoker Cont scheduled nebs - no wheezing at this time   Hyperglycemia  -Hgb a1c 6.0 -on steoids,  -continue mod SSI   Diarrhea  Resolved   Anemia hgb 9.9- stable - chronic disease vs dilutional   PVD s/p stent placement to right external iliac artery 05/15/14 (Dr. Donnetta Hutching) Foot is warm w/ pulse - pt denies complaints - cont Plavix   Severe malnutrition in the context of chronic illness Albumin 1.2 - nutrition consulted; recommend Ensure Enlive TID  Hypothermia  -Use bare hugger to maintain temperature 36.4C   Code Status: FULL Family Communication: Wife present at time of exam Disposition Plan: Transfer to [place] next week for chemotherapy/radiation    Consultants: Dr. Rush Farmer (PCCM) Dr.Peter Oletha Cruel, (oncology)   Procedure/Significant Events:  3/10 Bronchoscopy 3/7 Thoracentesis 3/8- TEE EF 55-60%, Trivial pericardial effusion at RV apex. Inadequate assessment of LV wall 3/11 CT abdomen pelvis with contrast RIGHT infrahilar mass 4.3 x 4.0 cm with suspected RIGHT hilar adenopathy. -Emphysematous changes with subtotal collapse of RLLwith slight pulmonary infiltrates; lymphangitic tumor?. -No acute intra-abdominal or intrapelvic metastases. 3/12 US guided left thoracentesis; 660 mL fluid removed  Culture BCx2 3/2 > neg UCx 3/2 > Sputum Cx 3/2 > C. Diff PCR 3/2 > 3/10 pathology/cytology from bronchoscopy SQUAMOUS CELL CARCINOMA.   Antibiotics: Vanc,  start date 3/2   Levaquin, start date 3/2 >>>3/3 Zosyn 3/4>>   DVT prophylaxis:  SCDs   Devices None  LINES / TUBES:  None    Continuous Infusions: . sodium  chloride 60 mL/hr at 06/29/14 2200    Objective: VITAL SIGNS: Temp: 97.7 F (36.5 C) (03/12 0700) Temp Source: Oral (03/12 0700) BP: 128/57 mmHg (03/12 1228) Pulse Rate: 70 (03/12 0725) SPO2; FIO2:   Intake/Output Summary (Last 24 hours) at 06/30/14 1311 Last data filed at 06/30/14 0900  Gross per 24 hour  Intake   2030 ml  Output   4125 ml  Net  -2095 ml     Exam: General: A/O 4, patient breathing on room air sitting comfortably in bed, able to carry on conversation. Lungs: poor air movement diffusely, but some air movement and all lobes, negative wheezes or crackles Cardiovascular: Regular rate and rhythm without murmur gallop or rub normal S1 and S2 Abdomen: Nontender, nondistended, soft, bowel sounds positive, no rebound, no ascites, no appreciable mass Extremities: No significant cyanosis, clubbing, or edema bilateral lower extremities  Data Reviewed: Basic Metabolic Panel:  Recent Labs Lab 06/25/14 0405 06/26/14 0254 06/26/14 2130 06/27/14 0300 06/28/14 0446 06/29/14 0232 06/30/14 0254  NA 128* 130*  --  135 133* 137 136  K 3.2* 4.0  --  4.1 3.2* 3.5 2.7*  CL 100 103  --  105 106 104 101  CO2 24 24  --  26 24 27 27   GLUCOSE 112* 95  --  124* 132* 123* 114*  BUN <5* 5*  --  7 9 11 9   CREATININE 0.50 0.54  --  0.52 0.43* 0.53 0.51  CALCIUM 6.7* 7.1*  --  7.8* 7.6* 7.8* 7.9*  MG 1.8  --   --  2.1 1.9 2.0 2.0  PHOS 2.6  --  2.3  --   --   --   --    Liver Function Tests:  Recent Labs Lab 06/26/14 0254 06/27/14 0300 06/28/14 0446 06/29/14 0232 06/30/14 0254  AST 52* 37 27 42* 39*  ALT 36 32 28 37 42  ALKPHOS 51 52 45 43 47  BILITOT 0.6 0.4 0.4 0.4 0.5  PROT 4.2* 5.0* 4.9* 4.6* 4.7*  ALBUMIN 1.2* 2.2* 1.9* 1.9* 1.9*   No results for input(s): LIPASE, AMYLASE in the last 168 hours. No results for input(s): AMMONIA in the last 168 hours. CBC:  Recent Labs Lab 06/26/14 1504 06/27/14 0300 06/28/14 0446 06/29/14 0232 06/30/14 0254  WBC 8.0  12.0* 11.1* 6.7 7.9  NEUTROABS 6.3 11.3* 9.7* 5.5 6.4  HGB 9.0* 9.2* 9.7* 9.4* 9.9*  HCT 26.3* 27.4* 28.4* 28.3* 28.9*  MCV 86.2 86.4 85.5 86.3 85.8  PLT 307 309 359 361 400   Cardiac Enzymes: No results for input(s): CKTOTAL, CKMB, CKMBINDEX, TROPONINI in the last 168 hours. BNP (last 3 results) No results for input(s): BNP in the last 8760 hours.  ProBNP (last 3 results) No results for input(s): PROBNP in the last 8760 hours.  CBG:  Recent Labs Lab 06/29/14 0748 06/29/14 1233 06/29/14 1629 06/29/14 2134 06/30/14 0723  GLUCAP 122* 188* 259* 102* 113*    Recent Results (from the past 240 hour(s))  Blood Culture (routine x 2)     Status: None   Collection Time: 06/20/14  1:49 PM  Result Value Ref Range Status   Specimen Description BLOOD ARM RIGHT  Final   Special Requests BOTTLES DRAWN AEROBIC AND ANAEROBIC 5CC  Final  Culture   Final    NO GROWTH 5 DAYS Performed at [facility]    Report Status 06/26/2014 FINAL  Final  Urine culture     Status: None   Collection Time: 06/20/14  2:12 PM  Result Value Ref Range Status   Specimen Description URINE, RANDOM  Final   Special Requests NONE  Final   Colony Count NO GROWTH Performed at [facility]   Final   Culture NO GROWTH Performed at [facility]   Final   Report Status 06/21/2014 FINAL  Final  Blood Culture (routine x 2)     Status: None   Collection Time: 06/20/14  2:49 PM  Result Value Ref Range Status   Specimen Description BLOOD RIGHT FOREARM  Final   Special Requests BOTTLES DRAWN AEROBIC AND ANAEROBIC 4CC  Final   Culture   Final    NO GROWTH 5 DAYS Performed at [facility]    Report Status 06/26/2014 FINAL  Final  MRSA PCR Screening     Status: None   Collection Time: 06/20/14  5:20 PM  Result Value Ref Range Status   MRSA by PCR NEGATIVE NEGATIVE Final    Comment:        The GeneXpert MRSA Assay (FDA approved for NASAL specimens only), is one component of  a comprehensive MRSA colonization surveillance program. It is not intended to diagnose MRSA infection nor to guide or monitor treatment for MRSA infections.   Culture, expectorated sputum-assessment     Status: None   Collection Time: 06/21/14  3:39 PM  Result Value Ref Range Status   Specimen Description SPUTUM  Final   Special Requests NONE  Final   Sputum evaluation   Final    MICROSCOPIC FINDINGS SUGGEST THAT THIS SPECIMEN IS NOT REPRESENTATIVE OF LOWER RESPIRATORY SECRETIONS. PLEASE RECOLLECT. Avis Epley RN [id number] 06/21/14 A BROWNING    Report Status 06/21/2014 FINAL  Final  Body fluid culture     Status: None   Collection Time: 06/25/14  3:15 PM  Result Value Ref Range Status   Specimen Description FLUID RIGHT PLEURAL  Final   Special Requests Normal  Final   Gram Stain   Final    FEW WBC PRESENT, PREDOMINANTLY PMN NO ORGANISMS SEEN Performed at [facility]    Culture   Final    NO GROWTH 3 DAYS Performed at [facility]    Report Status 06/29/2014 FINAL  Final  AFB culture with smear     Status: None (Preliminary result)   Collection Time: 06/25/14  3:15 PM  Result Value Ref Range Status   Specimen Description FLUID RIGHT PLEURAL  Final   Special Requests Normal  Final   Acid Fast Smear   Final    NO ACID FAST BACILLI SEEN Performed at [facility]    Culture   Final    CULTURE WILL BE EXAMINED FOR 6 WEEKS BEFORE ISSUING A FINAL REPORT Performed at [facility]    Report Status PENDING  Incomplete  Fungal stain     Status: None   Collection Time: 06/25/14  3:15 PM  Result Value Ref Range Status   Specimen Description FLUID RIGHT PLEURAL  Final   Special Requests Normal  Final   Fungal Smear   Final    NO YEAST OR FUNGAL ELEMENTS SEEN Performed at [facility]    Report Status 06/26/2014 FINAL  Final  AFB culture with smear  Status: None (Preliminary result)   Collection Time: 06/28/14 11:19 AM  Result  Value Ref Range Status   Specimen Description BRONCHIAL ALVEOLAR LAVAGE  Final   Special Requests Normal  Final   Acid Fast Smear   Final    NO ACID FAST BACILLI SEEN Performed at [facility]    Culture   Final    CULTURE WILL BE EXAMINED FOR 6 WEEKS BEFORE ISSUING A FINAL REPORT Performed at [facility]    Report Status PENDING  Incomplete  Fungus Culture with Smear     Status: None (Preliminary result)   Collection Time: 06/28/14 11:19 AM  Result Value Ref Range Status   Specimen Description BRONCHIAL ALVEOLAR LAVAGE  Final   Special Requests Normal  Final   Fungal Smear   Final    NO YEAST OR FUNGAL ELEMENTS SEEN Performed at [facility]    Culture   Final    CULTURE IN PROGRESS FOR FOUR WEEKS Performed at [facility]    Report Status PENDING  Incomplete  Culture, bal-quantitative     Status: None (Preliminary result)   Collection Time: 06/28/14 11:19 AM  Result Value Ref Range Status   Specimen Description BRONCHIAL ALVEOLAR LAVAGE  Final   Special Requests NONE  Final   Gram Stain   Final    FEW WBC PRESENT,BOTH PMN AND MONONUCLEAR RARE SQUAMOUS EPITHELIAL CELLS PRESENT NO ORGANISMS SEEN Performed at [place] PENDING  Incomplete   Culture   Final    Culture reincubated for better growth Performed at [facility]    Report Status PENDING  Incomplete     Studies:  Recent x-ray studies have been reviewed in detail by the Attending Physician  Scheduled Meds:  Scheduled Meds: . antiseptic oral rinse  7 mL Mouth Rinse q12n4p  . chlorhexidine  15 mL Mouth Rinse BID  . clopidogrel  75 mg Oral Daily  . dronabinol  5 mg Oral BID AC  . feeding supplement (ENSURE COMPLETE)  237 mL Oral TID BM  . heparin  5,000 Units Subcutaneous 3 times per day  . hydrocortisone sod succinate (SOLU-CORTEF) inj  100 mg Intravenous Q8H  . insulin aspart  0-15 Units Subcutaneous TID WC  . lidocaine (PF)      .  multivitamin with minerals  1 tablet Oral Daily  . piperacillin-tazobactam (ZOSYN)  IV  3.375 g Intravenous Q8H  . potassium chloride  30 mEq Oral Q4H    Time spent on care of this patient: 40 mins   Gene Glazebrook, Geraldo Docker ,MD [facility] Office  [phone] Pager - [phone]  On-Call/Text Page:      [email or web address]      password [email or web address]  If 7PM-7AM, please contact night-coverage [email or web address] Password [email or web address] 06/30/2014, 1:11 PM   LOS: 10 days   Care during the described time interval was provided by me . I have reviewed this patient's available data, including medical history, events of note, physical examination, radiology studies and test results as part of my evaluation  Dia Crawford, MD [phone] Pager

## 2014-07-01 DIAGNOSIS — E43 Unspecified severe protein-calorie malnutrition: Secondary | ICD-10-CM | POA: Insufficient documentation

## 2014-07-01 DIAGNOSIS — Z9889 Other specified postprocedural states: Secondary | ICD-10-CM | POA: Insufficient documentation

## 2014-07-01 DIAGNOSIS — E41 Nutritional marasmus: Secondary | ICD-10-CM

## 2014-07-01 LAB — COMPREHENSIVE METABOLIC PANEL
ALBUMIN: 1.9 g/dL — AB (ref 3.5–5.2)
ALK PHOS: 51 U/L (ref 39–117)
ALT: 43 U/L (ref 0–53)
ANION GAP: 7 (ref 5–15)
AST: 31 U/L (ref 0–37)
BILIRUBIN TOTAL: 0.4 mg/dL (ref 0.3–1.2)
BUN: 12 mg/dL (ref 6–23)
CO2: 28 mmol/L (ref 19–32)
Calcium: 7.8 mg/dL — ABNORMAL LOW (ref 8.4–10.5)
Chloride: 101 mmol/L (ref 96–112)
Creatinine, Ser: 0.52 mg/dL (ref 0.50–1.35)
GFR calc non Af Amer: >90 mL/min (ref >90)
GLUCOSE: 116 mg/dL — AB (ref 70–99)
Potassium: 2.6 mmol/L — CL (ref 3.5–5.1)
SODIUM: 136 mmol/L (ref 135–145)
Total Protein: 4.7 g/dL — ABNORMAL LOW (ref 6.0–8.3)

## 2014-07-01 LAB — CBC WITH DIFFERENTIAL/PLATELET
Basophils Absolute: 0 10*3/uL (ref 0.0–0.1)
Basophils Relative: 0 % (ref 0–1)
Eosinophils Absolute: 0 10*3/uL (ref 0.0–0.7)
Eosinophils Relative: 0 % (ref 0–5)
HEMATOCRIT: 28.9 % — AB (ref 39.0–52.0)
Hemoglobin: 9.9 g/dL — ABNORMAL LOW (ref 13.0–17.0)
LYMPHS ABS: 1.6 10*3/uL (ref 0.7–4.0)
LYMPHS PCT: 17 % (ref 12–46)
MCH: 29.6 pg (ref 26.0–34.0)
MCHC: 34.3 g/dL (ref 30.0–36.0)
MCV: 86.5 fL (ref 78.0–100.0)
Monocytes Absolute: 0.5 10*3/uL (ref 0.1–1.0)
Monocytes Relative: 5 % (ref 3–12)
Neutro Abs: 7.6 10*3/uL (ref 1.7–7.7)
Neutrophils Relative %: 78 % — ABNORMAL HIGH (ref 43–77)
Platelets: 398 10*3/uL (ref 150–400)
RBC: 3.34 MIL/uL — AB (ref 4.22–5.81)
RDW: 15.6 % — AB (ref 11.5–15.5)
WBC: 9.8 10*3/uL (ref 4.0–10.5)

## 2014-07-01 LAB — POTASSIUM: POTASSIUM: 3.4 mmol/L — AB (ref 3.5–5.1)

## 2014-07-01 LAB — GLUCOSE, CAPILLARY
GLUCOSE-CAPILLARY: 122 mg/dL — AB (ref 70–99)
GLUCOSE-CAPILLARY: 161 mg/dL — AB (ref 70–99)
Glucose-Capillary: 106 mg/dL — ABNORMAL HIGH (ref 70–99)

## 2014-07-01 LAB — MAGNESIUM
MAGNESIUM: 2 mg/dL (ref 1.5–2.5)
Magnesium: 2 mg/dL (ref 1.5–2.5)

## 2014-07-01 MED ORDER — POTASSIUM CHLORIDE 10 MEQ/100ML IV SOLN
10.0000 meq | INTRAVENOUS | Status: AC
  Administered 2014-07-01 (×3): 10 meq via INTRAVENOUS

## 2014-07-01 MED ORDER — HYDROCORTISONE NA SUCCINATE PF 100 MG IJ SOLR
25.0000 mg | Freq: Three times a day (TID) | INTRAMUSCULAR | Status: DC
  Administered 2014-07-01 – 2014-07-02 (×3): 25 mg via INTRAVENOUS
  Filled 2014-07-01 (×5): qty 0.5

## 2014-07-01 MED ORDER — POTASSIUM CHLORIDE CRYS ER 20 MEQ PO TBCR
30.0000 meq | EXTENDED_RELEASE_TABLET | ORAL | Status: AC
  Administered 2014-07-01 (×2): 30 meq via ORAL
  Filled 2014-07-01 (×2): qty 1

## 2014-07-01 MED ORDER — POTASSIUM CHLORIDE 10 MEQ/100ML IV SOLN
INTRAVENOUS | Status: AC
Start: 2014-07-01 — End: 2014-07-01
  Filled 2014-07-01: qty 100

## 2014-07-01 MED ORDER — POTASSIUM CHLORIDE 10 MEQ/100ML IV SOLN
INTRAVENOUS | Status: AC
  Filled 2014-07-01: qty 100

## 2014-07-01 NOTE — Progress Notes (Signed)
Critical potasium of 2.6 M Lynch paged new orders received will continue to monitor

## 2014-07-01 NOTE — Progress Notes (Signed)
[place] TEAM 1 - Stepdown/ICU TEAM Progress Note  Dustin Freeman HEN:[id number] DOB: 02-19-1956 DOA: 06/20/2014 PCP: No PCP Per Patient  Admit HPI / Brief Narrative: 59 y/o WM PMHx diabetes mellitus type 2, PVD status post stent of right external iliac artery, that presented to ED with dizziness, cough, and diarrhea.  Patient with recent hospitalization for syncope and underwent stent placement in occluded right external iliac artery.  He states he's had the diarrhea since discharge from the hospital on   . In the ED, chest x-ray with lower left lobe consolidation, patient remained hypotensive despite IV fluid, and was thus admitted under [facility] and care.  CCM was reconsulted on 35 CT showed right hilar mass involving the bronchus intermedius and pericardium.  On 310 patient underwent bronchoscopy pending results.   HPI/Subjective: 3/13 A/O 4, patient understands that he has squamous cell carcinoma and that he will be transferred to [facility] long hospital next week.   Assessment/Plan:  Extensive RLL, RML, and partial RUL postobustructive PNA - LLL infiltrate -CXR with worsening PNA and concern of postobstructive etiology -CT chest -large R hilar mass, Pulm performed thora but cytology not diagnostic, pt informed that is is almost definitely CA.  -Per CCM- Bronchoscopy 3/10- pending results; per Dr. Nelda Marseille [facility] M) right bronchus intermedius completely obstructed not amenable to any type of stenting. -Cont Cont emperic broad coverage  -Titrate O2 to maintain SPO2 89-93%  Fungal pneumonia? -Phone consult to Dr. Bobby Rumpf (infectious disease), discussed whether to treat or not; given that right bronchus intermedius was totally occluded (therefore not from behind obstruction) would not treat at this time.  Large soft tissue mass in the right hilum  -obstructs the Rt bronchus intermedius, (squamous cell carcinoma) - PCCM following- see above.  -Bronchoscopy 3/10- see results  below  SQUAMOUS CELL CARCINOMA. -Patient will be transferred to [facility] long hospital next week for radiation+ low-dose chemotherapy per Dr.Peter Oletha Cruel, (oncology)  Panlobular emphysema  -Stable  COPD via CXR in known smoker -Cont scheduled nebs - no wheezing at this time   Hypovolemic Shock - Sepsis due to PNA - resolved- BP stable -Cortisol normal - Continue to titrate Stress dose steroids; decrease Solu-Cortef 25 mg TID     Hyponatremia  -Possible element of SIADH, but agree w/ Dr. Sherral Hammers that fluid restriction not appropriate - Na has improved w/ volume which does indeed argue against SIADH   Hypokalemia -K-Dur 30 mEq TID  -Potassium IV 10 mEq 3 runs -Recheck potassium level  Hyperglycemia  -Hgb a1c 6.0 -on steoids,  -continue mod SSI   Anemia hgb 9.9- stable - chronic disease vs dilutional   PVD s/p stent placement to right external iliac artery 05/15/14 (Dr. Donnetta Hutching) Foot is warm w/ pulse - pt denies complaints - cont Plavix   Severe malnutrition in the context of chronic illness Albumin 1.2 - nutrition consulted; recommend Ensure Enlive TID  Hypothermia  -Use bare hugger to maintain temperature 36.4C   Code Status: FULL Family Communication: None  Disposition Plan: Transfer to [facility] next week for chemotherapy/radiation    Consultants: Dr. Rush Farmer (PCCM) Dr.Peter Oletha Cruel, (oncology) Dr. Bobby Rumpf (infectious disease)   Procedure/Significant Events:  3/10 Bronchoscopy 3/7 Thoracentesis 3/8- TEE EF 55-60%, Trivial pericardial effusion at RV apex. Inadequate assessment of LV wall 3/11 CT abdomen pelvis with contrast RIGHT infrahilar mass 4.3 x 4.0 cm with suspected RIGHT hilar adenopathy. -Emphysematous changes with subtotal collapse of RLLwith slight pulmonary infiltrates; lymphangitic tumor?. -No  acute intra-abdominal or intrapelvic metastases. 3/12 US guided left thoracentesis; 660 mL fluid removed  Culture BCx2 3/2 >  neg UCx 3/2 > Sputum Cx 3/2 > C. Diff PCR 3/2 > 3/10 pathology/cytology from bronchoscopy positive SQUAMOUS CELL CARCINOMA. 3/10 BAL positive Candida albicans, negative AFB  Antibiotics: Vanc, start date 3/2   Levaquin, start date 3/2 >>>3/3 Zosyn 3/4>>   DVT prophylaxis:  SCDs   Devices None  LINES / TUBES:  None    Continuous Infusions: . sodium chloride 60 mL/hr at 07/01/14 1200    Objective: VITAL SIGNS: Temp: 97.4 F (36.3 C) (03/13 1113) Temp Source: Oral (03/13 1113) BP: 126/76 mmHg (03/13 1220) Pulse Rate: 54 (03/13 1220) SPO2; FIO2:   Intake/Output Summary (Last 24 hours) at 07/01/14 1558 Last data filed at 07/01/14 1200  Gross per 24 hour  Intake   2390 ml  Output   1975 ml  Net    415 ml     Exam: General: A/O 4, patient breathing on room air sitting comfortably in bed, able to carry on conversation. Lungs: poor air movement diffusely, but some air movement and all lobes, negative wheezes or crackles Cardiovascular: Regular rate and rhythm without murmur gallop or rub normal S1 and S2 Abdomen: Nontender, nondistended, soft, bowel sounds positive, no rebound, no ascites, no appreciable mass Extremities: No significant cyanosis, clubbing, or edema bilateral lower extremities  Data Reviewed: Basic Metabolic Panel:  Recent Labs Lab 06/25/14 0405  06/26/14 2130 06/27/14 0300 06/28/14 0446 06/29/14 0232 06/30/14 0254 07/01/14 0402  NA 128*  < >  --  135 133* 137 136 136  K 3.2*  < >  --  4.1 3.2* 3.5 2.7* 2.6*  CL 100  < >  --  105 106 104 101 101  CO2 24  < >  --  26 24 27 27 28   GLUCOSE 112*  < >  --  124* 132* 123* 114* 116*  BUN <5*  < >  --  7 9 11 9 12   CREATININE 0.50  < >  --  0.52 0.43* 0.53 0.51 0.52  CALCIUM 6.7*  < >  --  7.8* 7.6* 7.8* 7.9* 7.8*  MG 1.8  --   --  2.1 1.9 2.0 2.0 2.0  PHOS 2.6  --  2.3  --   --   --   --   --   < > = values in this interval not displayed. Liver Function Tests:  Recent Labs Lab  06/27/14 0300 06/28/14 0446 06/29/14 0232 06/30/14 0254 07/01/14 0402  AST 37 27 42* 39* 31  ALT 32 28 37 42 43  ALKPHOS 52 45 43 47 51  BILITOT 0.4 0.4 0.4 0.5 0.4  PROT 5.0* 4.9* 4.6* 4.7* 4.7*  ALBUMIN 2.2* 1.9* 1.9* 1.9* 1.9*   No results for input(s): LIPASE, AMYLASE in the last 168 hours. No results for input(s): AMMONIA in the last 168 hours. CBC:  Recent Labs Lab 06/27/14 0300 06/28/14 0446 06/29/14 0232 06/30/14 0254 07/01/14 0402  WBC 12.0* 11.1* 6.7 7.9 9.8  NEUTROABS 11.3* 9.7* 5.5 6.4 7.6  HGB 9.2* 9.7* 9.4* 9.9* 9.9*  HCT 27.4* 28.4* 28.3* 28.9* 28.9*  MCV 86.4 85.5 86.3 85.8 86.5  PLT 309 359 361 400 398   Cardiac Enzymes: No results for input(s): CKTOTAL, CKMB, CKMBINDEX, TROPONINI in the last 168 hours. BNP (last 3 results) No results for input(s): BNP in the last 8760 hours.  ProBNP (last 3 results) No results for  input(s): PROBNP in the last 8760 hours.  CBG:  Recent Labs Lab 06/30/14 1308 06/30/14 1656 06/30/14 2137 07/01/14 0728 07/01/14 1116  GLUCAP 121* 126* 138* 106* 122*    Recent Results (from the past 240 hour(s))  Body fluid culture     Status: None   Collection Time: 06/25/14  3:15 PM  Result Value Ref Range Status   Specimen Description FLUID RIGHT PLEURAL  Final   Special Requests Normal  Final   Gram Stain   Final    FEW WBC PRESENT, PREDOMINANTLY PMN NO ORGANISMS SEEN Performed at [facility]    Culture   Final    NO GROWTH 3 DAYS Performed at [facility]    Report Status 06/29/2014 FINAL  Final  AFB culture with smear     Status: None (Preliminary result)   Collection Time: 06/25/14  3:15 PM  Result Value Ref Range Status   Specimen Description FLUID RIGHT PLEURAL  Final   Special Requests Normal  Final   Acid Fast Smear   Final    NO ACID FAST BACILLI SEEN Performed at [facility]    Culture   Final    CULTURE WILL BE EXAMINED FOR 6 WEEKS BEFORE ISSUING A FINAL REPORT Performed  at [facility]    Report Status PENDING  Incomplete  Fungal stain     Status: None   Collection Time: 06/25/14  3:15 PM  Result Value Ref Range Status   Specimen Description FLUID RIGHT PLEURAL  Final   Special Requests Normal  Final   Fungal Smear   Final    NO YEAST OR FUNGAL ELEMENTS SEEN Performed at [facility]    Report Status 06/26/2014 FINAL  Final  AFB culture with smear     Status: None (Preliminary result)   Collection Time: 06/28/14 11:19 AM  Result Value Ref Range Status   Specimen Description BRONCHIAL ALVEOLAR LAVAGE  Final   Special Requests Normal  Final   Acid Fast Smear   Final    NO ACID FAST BACILLI SEEN Performed at [facility]    Culture   Final    CULTURE WILL BE EXAMINED FOR 6 WEEKS BEFORE ISSUING A FINAL REPORT Performed at [facility]    Report Status PENDING  Incomplete  Fungus Culture with Smear     Status: None (Preliminary result)   Collection Time: 06/28/14 11:19 AM  Result Value Ref Range Status   Specimen Description BRONCHIAL ALVEOLAR LAVAGE  Final   Special Requests Normal  Final   Fungal Smear   Final    NO YEAST OR FUNGAL ELEMENTS SEEN Performed at [facility]    Culture   Final    CANDIDA ALBICANS Performed at [facility]    Report Status PENDING  Incomplete  Culture, bal-quantitative     Status: None   Collection Time: 06/28/14 11:19 AM  Result Value Ref Range Status   Specimen Description BRONCHIAL ALVEOLAR LAVAGE  Final   Special Requests NONE  Final   Gram Stain   Final    FEW WBC PRESENT,BOTH PMN AND MONONUCLEAR RARE SQUAMOUS EPITHELIAL CELLS PRESENT NO ORGANISMS SEEN Performed at [place]   Final    25,000 COLONIES/ML Performed at [facility]    Culture   Final    CANDIDA ALBICANS Performed at [facility]    Report Status 06/30/2014 FINAL  Final  Studies:  Recent x-ray studies have been reviewed in detail  by the Attending Physician  Scheduled Meds:  Scheduled Meds: . antiseptic oral rinse  7 mL Mouth Rinse q12n4p  . chlorhexidine  15 mL Mouth Rinse BID  . clopidogrel  75 mg Oral Daily  . dronabinol  5 mg Oral BID AC  . feeding supplement (ENSURE COMPLETE)  237 mL Oral TID BM  . heparin  5,000 Units Subcutaneous 3 times per day  . hydrocortisone sod succinate (SOLU-CORTEF) inj  50 mg Intravenous Q8H  . insulin aspart  0-15 Units Subcutaneous TID WC  . multivitamin with minerals  1 tablet Oral Daily  . piperacillin-tazobactam (ZOSYN)  IV  3.375 g Intravenous Q8H  . potassium chloride        Time spent on care of this patient: 40 mins   Arnelle Nale, Geraldo Docker ,MD [facility] Office  [phone] Pager - [phone]  On-Call/Text Page:      [email or web address]      password [email or web address]  If 7PM-7AM, please contact night-coverage [email or web address] Password [email or web address] 07/01/2014, 3:58 PM   LOS: 11 days   Care during the described time interval was provided by me . I have reviewed this patient's available data, including medical history, events of note, physical examination, radiology studies and test results as part of my evaluation  Dia Crawford, MD [phone] Pager

## 2014-07-02 ENCOUNTER — Ambulatory Visit: Payer: MEDICAID | Admitting: Radiation Oncology

## 2014-07-02 ENCOUNTER — Encounter: Payer: Self-pay | Admitting: Vascular Surgery

## 2014-07-02 DIAGNOSIS — E119 Type 2 diabetes mellitus without complications: Secondary | ICD-10-CM

## 2014-07-02 LAB — GLUCOSE, CAPILLARY
GLUCOSE-CAPILLARY: 95 mg/dL (ref 70–99)
GLUCOSE-CAPILLARY: 107 mg/dL — AB (ref 70–99)
Glucose-Capillary: 111 mg/dL — ABNORMAL HIGH (ref 70–99)
Glucose-Capillary: 113 mg/dL — ABNORMAL HIGH (ref 70–99)
Glucose-Capillary: 151 mg/dL — ABNORMAL HIGH (ref 70–99)

## 2014-07-02 LAB — CBC WITH DIFFERENTIAL/PLATELET
Basophils Absolute: 0 10*3/uL (ref 0.0–0.1)
Basophils Relative: 0 % (ref 0–1)
EOS PCT: 0 % (ref 0–5)
Eosinophils Absolute: 0 10*3/uL (ref 0.0–0.7)
HCT: 29.6 % — ABNORMAL LOW (ref 39.0–52.0)
Hemoglobin: 10 g/dL — ABNORMAL LOW (ref 13.0–17.0)
LYMPHS PCT: 18 % (ref 12–46)
Lymphs Abs: 1.9 10*3/uL (ref 0.7–4.0)
MCH: 30 pg (ref 26.0–34.0)
MCHC: 33.8 g/dL (ref 30.0–36.0)
MCV: 88.9 fL (ref 78.0–100.0)
Monocytes Absolute: 0.5 10*3/uL (ref 0.1–1.0)
Monocytes Relative: 5 % (ref 3–12)
Neutro Abs: 8.1 10*3/uL — ABNORMAL HIGH (ref 1.7–7.7)
Neutrophils Relative %: 77 % (ref 43–77)
PLATELETS: 370 10*3/uL (ref 150–400)
RBC: 3.33 MIL/uL — ABNORMAL LOW (ref 4.22–5.81)
RDW: 15.8 % — ABNORMAL HIGH (ref 11.5–15.5)
WBC: 10.5 10*3/uL (ref 4.0–10.5)

## 2014-07-02 LAB — MAGNESIUM: MAGNESIUM: 1.9 mg/dL (ref 1.5–2.5)

## 2014-07-02 MED ORDER — HYDROCORTISONE NA SUCCINATE PF 100 MG IJ SOLR
25.0000 mg | Freq: Two times a day (BID) | INTRAMUSCULAR | Status: DC
  Administered 2014-07-03: 25 mg via INTRAVENOUS
  Filled 2014-07-02 (×4): qty 0.5

## 2014-07-02 NOTE — Progress Notes (Signed)
Mr. Justiss looks a little bit better. He did have radiology takeoff 660 mL of fluid from the left lung. Hopefully, cytology will be out today or tomorrow.  He says he is eating a little bit better. He now is on Marinol. I realize that he has diabetes but I think that his diet can be as liberal as possible.  His prealbumin is 13.4. This actually is a little bit better than I thought.  He still needs to be moved over to [facility]. I will continue to consult on him over there.  He will need to have a Port-A-Cath placed.  Radiation oncology needs to be consult in so we can coordinate therapy for him area and the only way to open up his bronchus is with radiation and chemotherapy.  His CBC today is not too bad. His white count 10.5. Hemoglobin 10 and platelet count is 370. There is no metabolic studies for today.  On his physical exam, his vital signs pre-stable. Temperature 97.3. Pulse 53. Blood pressure 124/67. His lungs sound pretty decent. There may be some decreased over the right side. His cardiac exam is slow but regular. He has no murmurs, rubs or bruits. Abdomen is soft. Extremities shows some symmetric muscle x-ray upper lower extremities.  He has locally advanced-at least stage IIIA, probably more likely IIIB, non-small cell lung cancer of the right lung. The left pleural fluid cytology is pending.  We will proceed with radiation and chemotherapy. I will call radiation oncology.  We will continue to follow him along when he is over at [facility].  Sherrlyn Hock 2:32

## 2014-07-02 NOTE — Progress Notes (Signed)
I spoke with Dr. Marin Olp and Dr. Thereasa Solo about this patient.  He is in the process of a transfer to [facility] which hopefully will be complete as of this evening.  I am out of the clinic tomorrow but will plan to see the patient at [facility] on Wed, 3-16.  I asked Dr. Thereasa Solo to place an order for a Brain MRI so that his staging is more complete by Wednesday. That will help to facilitate my recommendations for him.  I called the patient to discuss the rationale for these plans.  Appreciate everyone's collaboration on this patient and I look forward to meeting him soon. -----------------------------------  Eppie Gibson, MD

## 2014-07-02 NOTE — Progress Notes (Signed)
Patient arrived to room 1322, per [facility]. Patient A/O x 4 ABC's intact. Denies needs or wants at this time.

## 2014-07-02 NOTE — H&P (Signed)
Attempted to call report at 1935, receiving nurse unavailable currently in a code. RN will call back when available [phone] Report called to [place] at [place], [place] notified for transport 2050 [facility] to bedside, report given. CCMD notified of pt transfer. Also called wife to notify of transfer, message left on v/m

## 2014-07-02 NOTE — Progress Notes (Addendum)
Patient ID: Dustin Freeman, male   DOB: 09-Dec-1955, 59 y.o.   MRN: [id number]    Pt with Rt lung cancer Being seen by Dr Marin Olp Need for [facility] a cath placement for chemotherapy  On Plavix now for recent iliac artery stent placed 04/2014  Did get Hep inj at 612 am today  Possible plan for transfer to [facility]  Will need off Plavix 5 days to safely proceed (if ok with MD) Possibly plan for 3/18  Talked with RN---she is holding todays dose of Plavix in case I will call [place] MD

## 2014-07-02 NOTE — Consult Note (Signed)
Chief Complaint: Chief Complaint  Patient presents with  . Diarrhea  . Weakness  New diagnosis squamous cell lung cancer   Referring Physician(s): [facility] Dr Marin Olp  History of Present Illness: Dustin Freeman is a 59 y.o. 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 History of palpitations     During [employer] intake exam, not since  . DM (diabetes mellitus)   . PVD (peripheral vascular disease)     s/p stent to right external iliac artery (05/15/14 - Dr. Donnetta Hutching)  . Collagen vascular disease   . Cancer     Past Surgical History  Procedure Laterality Date  . Lower extremity angiogram N/A 05/15/2014    Procedure: LOWER EXTREMITY ANGIOGRAM;  Surgeon: Serafina Mitchell, MD;  Location: [facility] CATH LAB;  Service: Cardiovascular;  Laterality: N/A;  . Video bronchoscopy Bilateral 06/28/2014    Procedure: VIDEO BRONCHOSCOPY WITHOUT FLUORO;  Surgeon: Rush Farmer, MD;  Location: [facility] ENDOSCOPY;  Service: Endoscopy;  Laterality: Bilateral;    Allergies: Review of patient's allergies indicates no known allergies.  Medications: Prior to Admission medications   Medication Sig Start Date End Date Taking? Authorizing Provider  clopidogrel (PLAVIX) 75 MG tablet Take 1 tablet (75 mg total) by mouth daily. 05/16/14  Yes Ripudeep Krystal Eaton, MD  metFORMIN (GLUCOPHAGE) 500 MG tablet Take 1 tablet (500 mg total) by mouth daily with breakfast. 05/16/14  Yes Ripudeep K Rai, MD  pregabalin (LYRICA) 75 MG capsule Take 1 capsule (75 mg total) by mouth 2 (two) times daily. 05/16/14  Yes Ripudeep K Rai, MD  blood glucose meter kit and supplies KIT  Diagnosis: diabetes mellitus. Dispense based on patient and insurance preference. Use up to four times daily as directed. (FOR ICD-9 250.00, 250.01). 05/16/14   Ripudeep K Rai, MD  nicotine (NICODERM CQ - DOSED IN MG/24 HOURS) 21 mg/24hr patch Place 1 patch (21 mg total) onto the skin daily. Patient not taking: Reported on 06/20/2014 05/16/14   Ripudeep Krystal Eaton, MD  oxyCODONE (OXY IR/ROXICODONE) 5 MG immediate release tablet Take 1 tablet (5 mg total) by mouth every 6 (six) hours as needed for moderate pain. Patient not taking: Reported on 06/20/2014 05/16/14   Ripudeep Krystal Eaton, MD     Family History  Problem Relation Age of Onset  . Diabetes Mellitus I Mother   . Peripheral vascular disease Father   . Leukemia Maternal Grandfather   . Leukemia Maternal Uncle   . Kidney disease Cousin   . Breast cancer Cousin     History   Social History  . Marital Status: Married    Spouse Name: N/A  . Number of Children: N/A  . Years of Education: N/A   Social History Main Topics  . Smoking status: Former Smoker -- 0.50 packs/day for 47 years    Types: Cigarettes    Quit date: 05/22/2014  . Smokeless tobacco: Not on file  . Alcohol Use: No  . Drug Use: No  . Sexual Activity: Not on file   Other Topics Concern  . None   Social History Narrative  Works at [employer], lives with wife        Review of Systems: A 12 point ROS discussed and pertinent positives are indicated in the HPI above.  All other systems are negative.  Review of Systems  Constitutional: Positive for activity change, appetite change, fatigue and unexpected weight change.  Respiratory: Positive for cough and shortness of breath. Negative for chest tightness.   Cardiovascular: Negative for chest pain.  Gastrointestinal: Positive for nausea. Negative for vomiting.  Musculoskeletal: Negative for back pain.  Neurological: Positive for weakness.  Psychiatric/Behavioral: Negative for behavioral problems and confusion.    Vital  Signs: BP 114/61 mmHg  Pulse 57  Temp(Src) 97.6 F (36.4 C) (Oral)  Resp 16  Ht _0  (1.753 m)  Wt 58.3 kg (128 lb 8.5 oz)  BMI 18.97 kg/m2  SpO2 97%  Physical Exam  Constitutional: He is oriented to person, place, and time.  Cardiovascular: Normal rate, regular rhythm and normal heart sounds.   No murmur heard. Pulmonary/Chest: Effort normal and breath sounds normal. He has no wheezes.  Abdominal: Soft. Bowel sounds are normal. There is no tenderness.  Musculoskeletal: Normal range of motion.  Neurological: He is alert and oriented to person, place, and time.  Skin: Skin is warm and dry.  Psychiatric: He has a normal mood and affect. His behavior is normal. Judgment and thought content normal.  Nursing note and vitals reviewed.   Mallampati Score:  MD Evaluation Airway: WNL Heart: WNL Abdomen: WNL ASA  Classification: 3 Mallampati/Airway Score: One  Imaging: Dg Chest 1 View  06/30/2014   CLINICAL DATA:  Post left-sided thoracentesis  EXAM: CHEST  1 VIEW  COMPARISON:  06/25/2014; 06/21/2014; chest CT -06/22/2014  FINDINGS: Grossly unchanged cardiac silhouette and mediastinal contours post median sternotomy and CABG with persistent partial obscuration of the right heart border secondary to interval increase in size of small to moderate partially loculated right-sided effusion with associated worsening right mid and lower lung heterogeneous/consolidative opacities.  Interval reduction/near resolution of left-sided effusion post thoracentesis. No pneumothorax. Improved aeration of the left lower lung.  Unchanged bones.  Regional soft tissues appear normal.  IMPRESSION: 1. Interval reduction/near resolution of left-sided effusion post thoracentesis. No pneumothorax. 2. Interval increase in small to moderate-sized potentially partially loculated effusion with worsening right mid and lower lung heterogeneous/consolidative opacities.   Electronically Signed   By: Sandi Mariscal M.D.   On:  06/30/2014 12:54   Ct Chest W Contrast  06/22/2014   CLINICAL DATA:  Peripheral vascular disease cardiac palpitations diabetes with cough and congestion currently, patient smokes, patient is homeless  EXAM: CT CHEST WITH CONTRAST  TECHNIQUE: Multidetector CT imaging of the chest was performed during intravenous contrast administration.  CONTRAST:  46m OMNIPAQUE IOHEXOL 300 MG/ML  SOLN  COMPARISON:  06/21/2014  FINDINGS: There is extensive infiltrate throughout the right lower lobe. The right lower lobe is densely consolidated. Infiltrate extends into the posterior aspects of the right upper lobe and throughout much of the right middle lobe, which is less severely involved the lower lobe. There is abnormal soft tissue in the right hilum measuring about 9 cm anterior to posterior by 3 cm left right seen best on image number 45. This extends from the superior hilum inferiorly to the level of the coronary sinus. This soft tissue is inseparable from the pericardium on the right, and causes mass effect on the bronchus intermedius, which is not visible as a result.  On the left there is mild to moderate  deep tendon infiltrate in the lower lobe. There are small bilateral pleural effusions. There is mild pericardial thickening/ fluid. There is a 10 mm subcarinal lymph node.  There is coronary artery calcification. Minimal aortic calcifications is present. Thoracic inlet is normal. There are no acute musculoskeletal findings.  IMPRESSION: Severe post obstructive pneumonia right lower lobe with extension of pneumonia into the right middle lobe and right upper lobe.  Large soft tissue mass in the right hilum obstructs the bronchus intermedius. Malignancy suspected  There are bilateral pleural effusions. There is infiltrate in the left lower lobe which, although dependent in position, is concerning for pneumonia as well. Significant atelectasis is an alternative consideration.   Electronically Signed   By: Skipper Cliche  M.D.   On: 06/22/2014 17:53   Ct Abdomen Pelvis W Contrast  06/29/2014   CLINICAL DATA:  Abnormal chest CT with RIGHT hilar mass suspicious for lung cancer, question metastatic disease, history diabetes, collagen vascular disease, former smoker  EXAM: CT ABDOMEN AND PELVIS WITH CONTRAST  TECHNIQUE: Multidetector CT imaging of the abdomen and pelvis was performed using the standard protocol following bolus administration of intravenous contrast. Sagittal and coronal MPR images reconstructed from axial data set.  CONTRAST:  136m OMNIPAQUE IOHEXOL 300 MG/ML SOLN IV. Dilute oral contrast.  COMPARISON:  CT chest 06/22/2014  FINDINGS: Bibasilar pleural effusions.  Compressive atelectasis LEFT lower lobe.  Underlying emphysematous changes.  RIGHT infrahilar mass 4.3 x 4.0 cm image 6 with suspected RIGHT hilar adenopathy.  Subtotal collapse of RIGHT lower lobe with extensive interstitial infiltrates in the RIGHT lung, involving the posterior RIGHT upper lobe and RIGHT lower lobe, question pneumonia versus lymphangitic tumor spread.  Diffuse ascites and scattered edema of soft tissue planes throughout abdomen and pelvis question anasarca or hypoproteinemia.  Scattered atherosclerotic calcifications.  Liver, spleen, pancreas, kidneys, and adrenal glands normal.  Minimally prominent stool in rectum and RIGHT colon.  Stomach and remaining bowel loops otherwise unremarkable for technique.  No mass, adenopathy, free fluid or inflammatory process.  No osseous metastatic lesions.  IMPRESSION: RIGHT infrahilar mass 4.3 x 4.0 cm with suspected RIGHT hilar adenopathy.  Emphysematous changes with subtotal collapse of RIGHT lower lobe with slight pulmonary infiltrates question pneumonia versus lymphangitic tumor.  Ascites and diffuse soft tissue edema question anasarca versus hypoproteinemia.  No acute intra-abdominal or intrapelvic metastases.   Electronically Signed   By: MLavonia DanaM.D.   On: 06/29/2014 19:53   Dg Chest Port 1  View  06/25/2014   CLINICAL DATA:  Post thoracentesis.  EXAM: PORTABLE CHEST - 1 VIEW  COMPARISON:  06/21/2014  FINDINGS: Bilateral lower lobe airspace opacities are noted, increasing on the left since prior study. Suspect layering effusions. , increasing on the left as well since prior study. No pneumothorax. There is a lying superimposed over the left upper lobe which simulates a pneumothorax, but lung markings are noted lateral to this line compatible with artifact.  Heart is borderline in size.  No acute bony abnormality.  IMPRESSION: Bilateral lower lobe airspace opacities and layering effusions, both worsening on the left since prior study. No pneumothorax.   Electronically Signed   By: KRolm BaptiseM.D.   On: 06/25/2014 15:51   Dg Chest Port 1 View  06/21/2014   CLINICAL DATA:  Hypoxia.  Respiratory distress.  Initial encounter.  EXAM: PORTABLE CHEST - 1 VIEW  COMPARISON:  Radiographs ranging from 05/13/2014 through 06/21/2014.  FINDINGS: 1642 hours. There has been further slight worsening of the  right infrahilar airspace disease, most consistent with right middle and lower lobe pneumonia. This could be on a post obstructive basis. There is minimal patchy opacity in the left lower lobe. No edema, pneumothorax or significant pleural effusion identified. The heart size is stable. Multiple telemetry leads overlie the chest.  IMPRESSION: Continue slight worsening of right basilar pneumonia, possibly postobstructive in etiology based on prior studies.   Electronically Signed   By: Richardean Sale M.D.   On: 06/21/2014 16:57   Dg Chest Port 1 View  06/21/2014   CLINICAL DATA:  Hospital acquired pneumonia  EXAM: PORTABLE CHEST - 1 VIEW  COMPARISON:  Chest x-ray of 06/20/2014  FINDINGS: There has been worsening of opacity in the right mid lung and right lung base most consistent with pneumonia. Small lucencies within the right lung base could represent areas of cavitation and followup chest x-ray or CT chest is  recommended. The lungs are somewhat hyperaerated suggesting emphysema. The heart is mildly enlarged. No bony abnormality is seen.  IMPRESSION: 1. Worsening of infiltrate in the right lung base most consistent with pneumonia. Small areas a cavitation cannot be excluded within this focus of pneumonia. Consider followup chest x-ray or CT chest. 2. Hyperaeration consistent with emphysema.   Electronically Signed   By: Ivar Drape M.D.   On: 06/21/2014 07:23   Dg Abd Acute W/chest  06/20/2014   CLINICAL DATA:  Right upper quadrant pain, cough for weeks.  EXAM: ACUTE ABDOMEN SERIES (ABDOMEN 2 VIEW & CHEST 1 VIEW)  COMPARISON:  05/13/2014  FINDINGS: New airspace opacity noted in the right lower lobe concerning for pneumonia. Underlying COPD. Heart is normal size. Left lung is clear. No effusions.  Mild diffuse gaseous distention of the colon. No evidence of bowel obstruction. No organomegaly or suspicious calcification.  IMPRESSION: Right lower lobe consolidation compatible with pneumonia.  COPD.   Electronically Signed   By: Rolm Baptise M.D.   On: 06/20/2014 13:11   US Thoracentesis Asp Pleural Space W/img Guide  06/30/2014   INDICATION: History of lung cancer, now with symptomatic left-sided pleural effusion. Please perform left-sided thoracentesis for therapeutic and diagnostic purposes.  EXAM: US THORACENTESIS ASP PLEURAL SPACE W/IMG GUIDE  COMPARISON:  Chest CT - 06/22/2014; CT abdomen pelvis - 06/29/2014; chest radiograph - 06/25/2014  MEDICATIONS: None  COMPLICATIONS: None immediate  TECHNIQUE: Informed written consent was obtained from the patient after a discussion of the risks, benefits and alternatives to treatment. A timeout was performed prior to the initiation of the procedure.  Initial ultrasound scanning demonstrates a small to moderate-sized left-sided pleural effusion. The lower chest was prepped and draped in the usual sterile fashion. 1% lidocaine was used for local anesthesia.  Under direct  ultrasound guidance, a 19 gauge, 7-cm, Yueh catheter was introduced. An ultrasound image was saved for documentation purposes. The thoracentesis was performed. The catheter was removed and a dressing was applied. The patient tolerated the procedure well without immediate post procedural complication. The patient was escorted to have an upright chest radiograph.  FINDINGS: A total of approximately 660 cc liters of serous fluid was removed. Requested samples were sent to the laboratory.  IMPRESSION: Successful ultrasound-guided left sided thoracentesis yielding 660 cc of pleural fluid.   Electronically Signed   By: Sandi Mariscal M.D.   On: 06/30/2014 13:27    Labs:  CBC:  Recent Labs  06/29/14 0232 06/30/14 0254 07/01/14 0402 07/02/14 0328  WBC 6.7 7.9 9.8 10.5  HGB 9.4* 9.9* 9.9* 10.0*  HCT 28.3* 28.9* 28.9* 29.6*  PLT 361 400 398 370    COAGS:  Recent Labs  05/14/14 0249 06/24/14 1350  INR 1.16 1.21    BMP:  Recent Labs  06/28/14 0446 06/29/14 0232 06/30/14 0254 07/01/14 0402 07/01/14 1657  NA 133* 137 136 136  --   K 3.2* 3.5 2.7* 2.6* 3.4*  CL 106 104 101 101  --   CO2 _0 --   GLUCOSE 132* 123* 114* 116*  --   BUN _1 --   CALCIUM 7.6* 7.8* 7.9* 7.8*  --   CREATININE 0.43* 0.53 0.51 0.52  --   GFRNONAA >90 >90 >90 >90  --   GFRAA >90 >90 >90 >90  --     LIVER FUNCTION TESTS:  Recent Labs  06/28/14 0446 06/29/14 0232 06/30/14 0254 07/01/14 0402  BILITOT 0.4 0.4 0.5 0.4  AST 27 42* 39* 31  ALT 28 37 42 43  ALKPHOS 45 43 47 51  PROT 4.9* 4.6* 4.7* 4.7*  ALBUMIN 1.9* 1.9* 1.9* 1.9*    TUMOR MARKERS: No results for input(s): AFPTM, CEA, CA199, CHROMGRNA in the last 8760 hours.  Assessment and Plan:  New lung ca dx Oncology consult per Dr Burnard Leigh [facility] placement for chemo Risks and Benefits discussed with the patient including, but not limited to bleeding, infection, pneumothorax, or fibrin sheath development and need for  additional procedures. All of the patient's questions were answered, patient is agreeable to proceed. Consent signed and in chart. 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  I greatly enjoyed meeting Dustin Freeman and look forward to participating in their care.  Signed: Akaisha Truman A 07/02/2014, 3:15 PM   I spent a total of 20 Minutes  in face to face in clinical consultation, greater than 50% of which was counseling/coordinating care for [facility] placement

## 2014-07-02 NOTE — Progress Notes (Signed)
UR COMPLETED  

## 2014-07-02 NOTE — Progress Notes (Signed)
[place] TEAM 1 - Stepdown/ICU TEAM Progress Note  Dustin Freeman RWE:[id number] DOB: 06/03/1955 DOA: 06/20/2014 PCP: No PCP Per Patient  Admit HPI / Brief Narrative: 59 y/o M w/ Hx of DM type II and PVD s/p stent placement right external iliac artery, brought to [facility] ED 3/2 for dizziness, cough, diarrhea. Hospitalized 3 weeks prior for syncope and underwent stent placement in occluded right external iliac artery. Ever since being discharged had diarrhea. In ED, CXR c/w RLL consolidation and patient remained hypotensive despite 4L IVF. PCCM consulted for admission.  Significant Events: 1/24 - 1/27 - admitted for syncope - found to have PVD with RLE claudication due to occluded right external iliac artery - underwent stent placement 3/2 - admit with ?HCAP  HPI/Subjective: Pt has no new complaints.  He states he is ready for transfer to [facility] to begin further w/u and start his CA tx.   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 Trula Slade who is ok w/ stopping plavix (note pt DID NOT get his dose today) - discussed w/ IR who is aware and will f/u at [facility] to place cath when safe to do so - XRT has asked that MRI brain be ordered to assist w/ staging, and will f/u w/ him on Wed at [facility] to begin planning of XRT   Fungus on BAL    -Phone consult to Dr. Bobby Rumpf (ID), discussed whether to treat or not; given that right bronchus intermedius was totally occluded would not treat at this time  Hypovolemic Shock - Sepsis due to PNA - resolved Cortisol normal - BP has stabilized - shock resolved - sepsis physiology resolved - wean stress dose steroids and follow BP    Hyponatremia  Na has improved w/ volume and stress dose steroids  Mild hypokalemia Cont to replace prn - Mg ok  COPD via CXR in known smoker Cont scheduled nebs - no wheezing at this time   Hyperglycemia  recent A1c did NOT meet criteria for true DM - patient started on stress dose steroids will cover with moderate SSI  Diarrhea  Resolved   Anemia chronic disease vs dilutional - stable Hgb   PVD s/p stent placement to right external iliac artery 05/15/14 (Dr. Trula Slade) Foot is warm w/ pulse - pt denies complaints - discussed w/ Dr. Trula Slade who has agreed w/ stopping plavix for portacath, and states ongoing use not strongly indicated    Severe malnutrition in the context of chronic illness Albumin 1.2 - nutrition consulted   Code Status: FULL Family Communication: no family present at time of exam Disposition Plan: transfer to [facility] to begin CA tx    Consultants: PCCM > [facility]   Procedures: 3/7 thoracentesis   3/8 TTE - EF 55-60% - unable to comment on WMA - trivial pericardial effusion  3/10 -  bronchoscopy 3/11 CT abdomen pelvis with contrast RIGHT infrahilar mass 4.3 x 4.0 cm with suspected RIGHT hilar Adenopathy -Emphysematous changes with subtotal collapse of RLLwith slight pulmonary infiltrates; lymphangitic tumor?-No acute intra-abdominal or intrapelvic metastases. 3/12 US guided left thoracentesis; 660 mL fluid removed  Antibiotics: Cefepime 3/2 > 3/3 Levaquin 3/2 Vanc 3/2 > 3/4 Zosyn 3/4 >  DVT prophylaxis: SQ heparin   Objective: Blood pressure 114/61, pulse 57, temperature 97.6 F (36.4 C), temperature  source Oral, resp. rate 16, height 5\' 9"  (1.753 m), weight 58.3 kg (128 lb 8.5 oz), SpO2 97 %.  Intake/Output Summary (Last 24 hours) at 07/02/14 1352 Last data filed at 07/02/14 1200  Gross per 24 hour  Intake   2200 ml  Output   4750 ml  Net  -2550 ml   Exam: General: No acute respiratory distress - alert and conversant  Lungs: R basilar crackles - no wheeze  Cardiovascular: Regular rate and rhythm - no appreciable M, G, or R Abdomen: Nontender, nondistended, soft, bowel sounds positive, no rebound, no ascites, no appreciable mass Extremities: No significant cyanosis, clubbing, edema bilateral lower extremities  Data Reviewed: Basic Metabolic Panel:  Recent Labs Lab 06/26/14 2130  06/27/14 0300 06/28/14 0446 06/29/14 0232 06/30/14 0254 07/01/14 0402 07/01/14 1657 07/02/14 0328  NA  --   --  135 133* 137 136 136  --   --   K  --   --  4.1 3.2* 3.5 2.7* 2.6* 3.4*  --   CL  --   --  105 106 104 101 101  --   --   CO2  --   --  26 24 27 27 28   --   --   GLUCOSE  --   --  124* 132* 123* 114* 116*  --   --   BUN  --   --  7 9 11 9 12   --   --   CREATININE  --   --  0.52 0.43* 0.53 0.51 0.52  --   --   CALCIUM  --   --  7.8* 7.6* 7.8* 7.9* 7.8*  --   --   MG  --   < > 2.1 1.9 2.0 2.0 2.0 2.0 1.9  PHOS 2.3  --   --   --   --   --   --   --   --   < > = values in this interval not displayed.  Liver Function Tests:  Recent Labs Lab 06/27/14 0300 06/28/14 0446 06/29/14 0232 06/30/14 0254 07/01/14 0402  AST 37 27 42* 39* 31  ALT 32 28 37 42 43  ALKPHOS 52 45 43 47 51  BILITOT 0.4 0.4 0.4 0.5 0.4  PROT 5.0* 4.9* 4.6* 4.7* 4.7*  ALBUMIN 2.2* 1.9* 1.9* 1.9* 1.9*   CBC:  Recent Labs Lab 06/28/14 0446 06/29/14 0232 06/30/14 0254 07/01/14 0402 07/02/14 0328  WBC 11.1* 6.7 7.9 9.8 10.5  NEUTROABS 9.7* 5.5 6.4 7.6 8.1*  HGB 9.7* 9.4* 9.9* 9.9* 10.0*  HCT 28.4* 28.3* 28.9* 28.9* 29.6*  MCV 85.5 86.3 85.8 86.5 88.9  PLT 359 361 400 398 370     CBG:  Recent  Labs Lab 07/01/14 1116 07/01/14 1516 07/01/14 1628 07/02/14 0736 07/02/14 1237  GLUCAP 122* 161* 151* 95 107*    Recent Results (from the past 240 hour(s))  Body fluid culture     Status: None   Collection Time: 06/25/14  3:15 PM  Result Value Ref  Range Status   Specimen Description FLUID RIGHT PLEURAL  Final   Special Requests Normal  Final   Gram Stain   Final    FEW WBC PRESENT, PREDOMINANTLY PMN NO ORGANISMS SEEN Performed at [facility]    Culture   Final    NO GROWTH 3 DAYS Performed at [facility]    Report Status 06/29/2014 FINAL  Final  AFB culture with smear     Status: None (Preliminary result)   Collection Time: 06/25/14  3:15 PM  Result Value Ref Range Status   Specimen Description FLUID RIGHT PLEURAL  Final   Special Requests Normal  Final   Acid Fast Smear   Final    NO ACID FAST BACILLI SEEN Performed at [facility]    Culture   Final    CULTURE WILL BE EXAMINED FOR 6 WEEKS BEFORE ISSUING A FINAL REPORT Performed at [facility]    Report Status PENDING  Incomplete  Fungal stain     Status: None   Collection Time: 06/25/14  3:15 PM  Result Value Ref Range Status   Specimen Description FLUID RIGHT PLEURAL  Final   Special Requests Normal  Final   Fungal Smear   Final    NO YEAST OR FUNGAL ELEMENTS SEEN Performed at [facility]    Report Status 06/26/2014 FINAL  Final  AFB culture with smear     Status: None (Preliminary result)   Collection Time: 06/28/14 11:19 AM  Result Value Ref Range Status   Specimen Description BRONCHIAL ALVEOLAR LAVAGE  Final   Special Requests Normal  Final   Acid Fast Smear   Final    NO ACID FAST BACILLI SEEN Performed at [facility]    Culture   Final    CULTURE WILL BE EXAMINED FOR 6 WEEKS BEFORE ISSUING A FINAL REPORT Performed at [facility]    Report Status PENDING  Incomplete  Fungus Culture with Smear     Status: None (Preliminary result)    Collection Time: 06/28/14 11:19 AM  Result Value Ref Range Status   Specimen Description BRONCHIAL ALVEOLAR LAVAGE  Final   Special Requests Normal  Final   Fungal Smear   Final    NO YEAST OR FUNGAL ELEMENTS SEEN Performed at [facility]    Culture   Final    CANDIDA ALBICANS Performed at [facility]    Report Status PENDING  Incomplete  Culture, bal-quantitative     Status: None   Collection Time: 06/28/14 11:19 AM  Result Value Ref Range Status   Specimen Description BRONCHIAL ALVEOLAR LAVAGE  Final   Special Requests NONE  Final   Gram Stain   Final    FEW WBC PRESENT,BOTH PMN AND MONONUCLEAR RARE SQUAMOUS EPITHELIAL CELLS PRESENT NO ORGANISMS SEEN Performed at [place]   Final    25,000 COLONIES/ML Performed at [facility]    Culture   Final    CANDIDA ALBICANS Performed at [facility]    Report Status 06/30/2014 FINAL  Final     Studies:  Recent x-ray studies have been reviewed in detail by the Attending Physician  Scheduled Meds:  Scheduled Meds: . antiseptic oral rinse  7 mL Mouth Rinse q12n4p  . chlorhexidine  15 mL Mouth Rinse BID  . clopidogrel  75 mg Oral Daily  . dronabinol  5 mg Oral BID AC  . feeding supplement (ENSURE  COMPLETE)  237 mL Oral TID BM  . heparin  5,000 Units Subcutaneous 3 times per day  . hydrocortisone sod succinate (SOLU-CORTEF) inj  25 mg Intravenous Q8H  . insulin aspart  0-15 Units Subcutaneous TID WC  . multivitamin with minerals  1 tablet Oral Daily  . piperacillin-tazobactam (ZOSYN)  IV  3.375 g Intravenous Q8H    Time spent on care of this patient: 35 mins   Giavanni Odonovan T , MD   [facility] Office  [phone] Pager - Text Page per Shea Evans as per below:  On-Call/Text Page:      [email or web address]      password [email or web address]  If 7PM-7AM, please contact night-coverage [email or web address] Password [email or web address] 07/02/2014, 1:52 PM   LOS: 12 days

## 2014-07-03 ENCOUNTER — Inpatient Hospital Stay (HOSPITAL_COMMUNITY): Payer: Medicaid Other

## 2014-07-03 ENCOUNTER — Ambulatory Visit: Payer: Self-pay | Admitting: Vascular Surgery

## 2014-07-03 ENCOUNTER — Encounter (HOSPITAL_COMMUNITY): Payer: Self-pay

## 2014-07-03 DIAGNOSIS — C3400 Malignant neoplasm of unspecified main bronchus: Secondary | ICD-10-CM | POA: Insufficient documentation

## 2014-07-03 DIAGNOSIS — C3401 Malignant neoplasm of right main bronchus: Secondary | ICD-10-CM

## 2014-07-03 LAB — GLUCOSE, CAPILLARY
Glucose-Capillary: 98 mg/dL (ref 70–99)
Glucose-Capillary: 104 mg/dL — ABNORMAL HIGH (ref 70–99)
Glucose-Capillary: 186 mg/dL — ABNORMAL HIGH (ref 70–99)
Glucose-Capillary: 137 mg/dL — ABNORMAL HIGH (ref 70–99)

## 2014-07-03 MED ORDER — POLYETHYLENE GLYCOL 3350 17 G PO PACK
17.0000 g | PACK | Freq: Every day | ORAL | Status: DC
  Administered 2014-07-03 – 2014-07-10 (×8): 17 g via ORAL
  Filled 2014-07-03 (×9): qty 1

## 2014-07-03 MED ORDER — DEXAMETHASONE 6 MG PO TABS
6.0000 mg | ORAL_TABLET | Freq: Two times a day (BID) | ORAL | Status: DC
  Administered 2014-07-03 – 2014-07-09 (×13): 6 mg via ORAL
  Filled 2014-07-03 (×18): qty 1

## 2014-07-03 MED ORDER — TECHNETIUM TC 99M MEDRONATE IV KIT
24.0000 | PACK | Freq: Once | INTRAVENOUS | Status: AC | PRN
  Administered 2014-07-03: 24 via INTRAVENOUS

## 2014-07-03 MED ORDER — GADOBENATE DIMEGLUMINE 529 MG/ML IV SOLN
10.0000 mL | Freq: Once | INTRAVENOUS | Status: AC | PRN
  Administered 2014-07-03: 10 mL via INTRAVENOUS

## 2014-07-03 MED ORDER — FLUCONAZOLE 100 MG PO TABS
100.0000 mg | ORAL_TABLET | Freq: Every day | ORAL | Status: DC
  Administered 2014-07-03 – 2014-07-10 (×8): 100 mg via ORAL
  Filled 2014-07-03 (×8): qty 1

## 2014-07-03 NOTE — Progress Notes (Signed)
Per Dr Isidore Moos, patient to arrive in rad onc nursing on 07/04/14 at 8:30- 8:40 am for brief consult with Dr Isidore Moos. He then will have ct sim at 9 am. Spoke with Hoyle Sauer, RN for pt in room 1322 and informed her of above. She states patient can be transported via wheelchair, has IV access, no O2.  Requested she pre medicate pt as needed prior to ct sim. Hoyle Sauer verbalized understanding, agreement. Informed CT Sim dept and Dr Isidore Moos. Will inform Rodolph Bong, pt transporters, today.

## 2014-07-03 NOTE — Progress Notes (Signed)
Dustin Freeman is now over on [address] at [facility]. He sees me doing okay.  Of note, he had an MRI of the brain back in late January. I really don't think he is a have another MRI the brain now.  He will get his Port-A-Cath placed today. Radiation oncology should see him soon.  Once the Port-A-Cath is in place, I will start the chemotherapy. At least we can get this going so that we can try to reverse the blockage on the right lung.  I will think about doing a bone scan on him. This might be useful for staging. We cannot do a PET scan as an inpatient.  He says he is eating okay. He is diabetic and that does put some limitation on the conic calories that he can get in. His blood sugars have not been that bad.  On his visible exam, he is thin. His vital signs show a temperature 98.4. Pulse 60. Blood pressure 131/69. His head and neck exam shows no adenopathy. There is no oral lesions. He has some temporal muscle wasting. Lungs show pretty decent breath sounds bilaterally. There may be some slight decrease over on the right side. Cardiac exam regular rate and rhythm with no murmurs, rubs or bruits. Abdomen is soft. His bowel sounds are active. There is no guarding or rebound tenderness. There is no palpable abdominal mass. There is no palpable liver or spleen tip. Back exam shows no tenderness over the spine, ribs or hips. Extremities shows some muscle/face and upper and lower extremities.  No lab work is done today. I will get some tomorrow.  I will tentatively plan to start treatment on him tomorrow. If radiation oncology sees him today, they can get him set up for planning in the next day or so. The timing will be pretty good for this.  He is ready to start treatment. He wants to try as much as he can to get better.  As always, I will pretty hard for him.  Pete E.  Rodman Key 24:13

## 2014-07-03 NOTE — Progress Notes (Addendum)
Progress Note   Dustin Freeman JHE:[id number] DOB: May 15, 1955 DOA: 06/20/2014 PCP: No PCP Per Patient   Brief Narrative:   Dustin Freeman is an 59 y.o. male with a PMH of DM type II and PVD s/p stent placement right external iliac artery, brought to [facility] ED 06/20/14 for dizziness, cough, diarrhea. Hospitalized 3 weeks prior for syncope and underwent stent placement in occluded right external iliac artery. Ever since being discharged had diarrhea. In ED, CXR c/w RLL consolidation and patient remained hypotensive despite 4L IVF. PCCM consulted for admission.  Assessment/Plan:   Principal problem:  Extensive RLL, RML, and partial RUL postobustructive PNA - LLL infiltrate - Continue emperic broad coverage.  - CT chest ordered due to concern this could be a postobstructive process confirmed large R hilar mass.  - Status post thoracentesis 06/25/14 yielding 1200 mL fluid but cytology not diagnostic.  - Status post bronchoscopy on 06/28/14 with path revealing squamous cell carcinoma (see discussion below).  Large soft tissue mass in the right hilum > Squamous cell carcinoma  - Mass obstructs the bronchus intermedius. - PCCM following - transferred to [facility] for radiation+ low-dose chemotherapy per Dr.Peter R Ennever.  - Patient will need porta-cath for chemo but has been on plavix, which was held as of 07/02/14.  - Plavix issue discussed w/ Dr. Trula Slade who is ok w/ stopping plavix. - IR is aware and will f/u at [facility] to place cath when safe to do so - XRT has asked that MRI brain be ordered to assist w/ staging, and will f/u w/ him 07/04/14 at [facility] to begin planning of XRT.  - Bone scan ordered for further staging.  Fungus on BAL  -Phone consult to Dr. Bobby Rumpf (ID), discussed whether to treat or not; given that right bronchus intermedius was totally occluded would not treat at this time.  Hypovolemic Shock - Sepsis due to PNA  - Resolved. - Cortisol normal - BP has stabilized -  shock resolved - sepsis physiology resolved - wean stress dose steroids and follow BP.   Hyponatremia  -Na has improved w/ volume and stress dose steroids.  Mild hypokalemia - Monitor and replace prn - Mg ok.  COPD via CXR in known smoker - Cont scheduled nebs - no wheezing at this time.   Hyperglycemia  - Likely steroid related - on stress dose steroids will cover with moderate SSI. - Hemoglobin A1c 6%.  Diarrhea  - Resolved.   Anemia - Chronic disease vs dilutional - stable Hgb.   PVD s/p stent placement to right external iliac artery 05/15/14 (Dr. Trula Slade) - Foot is warm w/ pulse - pt denies complaints  - Dr. Trula Slade agreed w/ stopping plavix for portacath, and feels ongoing use not strongly indicated.   Severe malnutrition in the context of chronic illness - Albumin 1.2 - nutrition consulted.  Code Status: Full. Family Communication: No family at the bedside. Disposition Plan: Home when stable.   IV Access:    Peripheral IV   Procedures and diagnostic studies:   Dg Chest 1 View  06/30/2014   CLINICAL DATA:  Post left-sided thoracentesis  EXAM: CHEST  1 VIEW  COMPARISON:  06/25/2014; 06/21/2014; chest CT -06/22/2014  FINDINGS: Grossly unchanged cardiac silhouette and mediastinal contours post median sternotomy and CABG with persistent partial obscuration of the right heart border secondary to interval increase in size of small to moderate partially loculated right-sided effusion with associated worsening right mid and lower lung heterogeneous/consolidative opacities.  Interval reduction/near resolution of left-sided effusion post thoracentesis. No pneumothorax. Improved aeration of the left lower lung.  Unchanged bones.  Regional soft tissues appear normal.  IMPRESSION: 1. Interval reduction/near resolution of left-sided effusion post thoracentesis. No pneumothorax. 2. Interval increase in small to moderate-sized potentially partially loculated effusion with  worsening right mid and lower lung heterogeneous/consolidative opacities.   Electronically Signed   By: Sandi Mariscal M.D.   On: 06/30/2014 12:54   Ct Chest W Contrast  06/22/2014   CLINICAL DATA:  Peripheral vascular disease cardiac palpitations diabetes with cough and congestion currently, patient smokes, patient is homeless  EXAM: CT CHEST WITH CONTRAST  TECHNIQUE: Multidetector CT imaging of the chest was performed during intravenous contrast administration.  CONTRAST:  109mL OMNIPAQUE IOHEXOL 300 MG/ML  SOLN  COMPARISON:  06/21/2014  FINDINGS: There is extensive infiltrate throughout the right lower lobe. The right lower lobe is densely consolidated. Infiltrate extends into the posterior aspects of the right upper lobe and throughout much of the right middle lobe, which is less severely involved the lower lobe. There is abnormal soft tissue in the right hilum measuring about 9 cm anterior to posterior by 3 cm left right seen best on image number 45. This extends from the superior hilum inferiorly to the level of the coronary sinus. This soft tissue is inseparable from the pericardium on the right, and causes mass effect on the bronchus intermedius, which is not visible as a result.  On the left there is mild to moderate deep tendon infiltrate in the lower lobe. There are small bilateral pleural effusions. There is mild pericardial thickening/ fluid. There is a 10 mm subcarinal lymph node.  There is coronary artery calcification. Minimal aortic calcifications is present. Thoracic inlet is normal. There are no acute musculoskeletal findings.  IMPRESSION: Severe post obstructive pneumonia right lower lobe with extension of pneumonia into the right middle lobe and right upper lobe.  Large soft tissue mass in the right hilum obstructs the bronchus intermedius. Malignancy suspected  There are bilateral pleural effusions. There is infiltrate in the left lower lobe which, although dependent in position, is concerning  for pneumonia as well. Significant atelectasis is an alternative consideration.   Electronically Signed   By: Skipper Cliche M.D.   On: 06/22/2014 17:53   Ct Abdomen Pelvis W Contrast  06/29/2014   CLINICAL DATA:  Abnormal chest CT with RIGHT hilar mass suspicious for lung cancer, question metastatic disease, history diabetes, collagen vascular disease, former smoker  EXAM: CT ABDOMEN AND PELVIS WITH CONTRAST  TECHNIQUE: Multidetector CT imaging of the abdomen and pelvis was performed using the standard protocol following bolus administration of intravenous contrast. Sagittal and coronal MPR images reconstructed from axial data set.  CONTRAST:  139mL OMNIPAQUE IOHEXOL 300 MG/ML SOLN IV. Dilute oral contrast.  COMPARISON:  CT chest 06/22/2014  FINDINGS: Bibasilar pleural effusions.  Compressive atelectasis LEFT lower lobe.  Underlying emphysematous changes.  RIGHT infrahilar mass 4.3 x 4.0 cm image 6 with suspected RIGHT hilar adenopathy.  Subtotal collapse of RIGHT lower lobe with extensive interstitial infiltrates in the RIGHT lung, involving the posterior RIGHT upper lobe and RIGHT lower lobe, question pneumonia versus lymphangitic tumor spread.  Diffuse ascites and scattered edema of soft tissue planes throughout abdomen and pelvis question anasarca or hypoproteinemia.  Scattered atherosclerotic calcifications.  Liver, spleen, pancreas, kidneys, and adrenal glands normal.  Minimally prominent stool in rectum and RIGHT colon.  Stomach and remaining bowel loops otherwise unremarkable for technique.  No  mass, adenopathy, free fluid or inflammatory process.  No osseous metastatic lesions.  IMPRESSION: RIGHT infrahilar mass 4.3 x 4.0 cm with suspected RIGHT hilar adenopathy.  Emphysematous changes with subtotal collapse of RIGHT lower lobe with slight pulmonary infiltrates question pneumonia versus lymphangitic tumor.  Ascites and diffuse soft tissue edema question anasarca versus hypoproteinemia.  No acute  intra-abdominal or intrapelvic metastases.   Electronically Signed   By: Lavonia Dana M.D.   On: 06/29/2014 19:53   Dg Chest Port 1 View  06/25/2014   CLINICAL DATA:  Post thoracentesis.  EXAM: PORTABLE CHEST - 1 VIEW  COMPARISON:  06/21/2014  FINDINGS: Bilateral lower lobe airspace opacities are noted, increasing on the left since prior study. Suspect layering effusions. , increasing on the left as well since prior study. No pneumothorax. There is a lying superimposed over the left upper lobe which simulates a pneumothorax, but lung markings are noted lateral to this line compatible with artifact.  Heart is borderline in size.  No acute bony abnormality.  IMPRESSION: Bilateral lower lobe airspace opacities and layering effusions, both worsening on the left since prior study. No pneumothorax.   Electronically Signed   By: Rolm Baptise M.D.   On: 06/25/2014 15:51   Dg Chest Port 1 View  06/21/2014   CLINICAL DATA:  Hypoxia.  Respiratory distress.  Initial encounter.  EXAM: PORTABLE CHEST - 1 VIEW  COMPARISON:  Radiographs ranging from 05/13/2014 through 06/21/2014.  FINDINGS: 1642 hours. There has been further slight worsening of the right infrahilar airspace disease, most consistent with right middle and lower lobe pneumonia. This could be on a post obstructive basis. There is minimal patchy opacity in the left lower lobe. No edema, pneumothorax or significant pleural effusion identified. The heart size is stable. Multiple telemetry leads overlie the chest.  IMPRESSION: Continue slight worsening of right basilar pneumonia, possibly postobstructive in etiology based on prior studies.   Electronically Signed   By: Richardean Sale M.D.   On: 06/21/2014 16:57   Dg Chest Port 1 View  06/21/2014   CLINICAL DATA:  Hospital acquired pneumonia  EXAM: PORTABLE CHEST - 1 VIEW  COMPARISON:  Chest x-ray of 06/20/2014  FINDINGS: There has been worsening of opacity in the right mid lung and right lung base most consistent  with pneumonia. Small lucencies within the right lung base could represent areas of cavitation and followup chest x-ray or CT chest is recommended. The lungs are somewhat hyperaerated suggesting emphysema. The heart is mildly enlarged. No bony abnormality is seen.  IMPRESSION: 1. Worsening of infiltrate in the right lung base most consistent with pneumonia. Small areas a cavitation cannot be excluded within this focus of pneumonia. Consider followup chest x-ray or CT chest. 2. Hyperaeration consistent with emphysema.   Electronically Signed   By: Ivar Drape M.D.   On: 06/21/2014 07:23   Dg Abd Acute W/chest  06/20/2014   CLINICAL DATA:  Right upper quadrant pain, cough for weeks.  EXAM: ACUTE ABDOMEN SERIES (ABDOMEN 2 VIEW & CHEST 1 VIEW)  COMPARISON:  05/13/2014  FINDINGS: New airspace opacity noted in the right lower lobe concerning for pneumonia. Underlying COPD. Heart is normal size. Left lung is clear. No effusions.  Mild diffuse gaseous distention of the colon. No evidence of bowel obstruction. No organomegaly or suspicious calcification.  IMPRESSION: Right lower lobe consolidation compatible with pneumonia.  COPD.   Electronically Signed   By: Rolm Baptise M.D.   On: 06/20/2014 13:11   US Thoracentesis  Asp Pleural Space W/img Guide  06/30/2014   INDICATION: History of lung cancer, now with symptomatic left-sided pleural effusion. Please perform left-sided thoracentesis for therapeutic and diagnostic purposes.  EXAM: US THORACENTESIS ASP PLEURAL SPACE W/IMG GUIDE  COMPARISON:  Chest CT - 06/22/2014; CT abdomen pelvis - 06/29/2014; chest radiograph - 06/25/2014  MEDICATIONS: None  COMPLICATIONS: None immediate  TECHNIQUE: Informed written consent was obtained from the patient after a discussion of the risks, benefits and alternatives to treatment. A timeout was performed prior to the initiation of the procedure.  Initial ultrasound scanning demonstrates a small to moderate-sized left-sided pleural  effusion. The lower chest was prepped and draped in the usual sterile fashion. 1% lidocaine was used for local anesthesia.  Under direct ultrasound guidance, a 19 gauge, 7-cm, Yueh catheter was introduced. An ultrasound image was saved for documentation purposes. The thoracentesis was performed. The catheter was removed and a dressing was applied. The patient tolerated the procedure well without immediate post procedural complication. The patient was escorted to have an upright chest radiograph.  FINDINGS: A total of approximately 660 cc liters of serous fluid was removed. Requested samples were sent to the laboratory.  IMPRESSION: Successful ultrasound-guided left sided thoracentesis yielding 660 cc of pleural fluid.   Electronically Signed   By: Sandi Mariscal M.D.   On: 06/30/2014 13:27     Medical Consultants:    Dr. Burney Gauze, Oncology  Dr. Eppie Gibson, Radiation Oncology  IR  Dr. Bobby Rumpf, ID  Anti-Infectives:    Cefepime 06/20/14--> 06/21/14  Levaquin 06/20/14--->06/20/14  Vanc 06/20/14--- > 06/22/14  Zosyn 06/22/14--->  Subjective:   Dustin Freeman tells me he has not had a BM in 3-4 weeks!  Appetite is OK.  No pain except some "stinging" in the left groin area.  No N/V.  Objective:    Filed Vitals:   07/02/14 1916 07/02/14 2220 07/03/14 0428 07/03/14 0500  BP: 134/66 127/72 131/69   Pulse: 56 62 60   Temp: 97.4 F (36.3 C) 97.6 F (36.4 C) 98.4 F (36.9 C)   TempSrc: Oral Oral Oral   Resp: 15 16 16    Height:  5\' 9"  (1.753 m)    Weight:  56.2 kg (123 lb 14.4 oz)  55.339 kg (122 lb)  SpO2: 96% 97% 97%     Intake/Output Summary (Last 24 hours) at 07/03/14 0750 Last data filed at 07/03/14 0538  Gross per 24 hour  Intake    810 ml  Output   3275 ml  Net  -2465 ml    Exam: Gen:  NAD Cardiovascular:  RRR, No M/R/G Respiratory:  Lungs diminished Gastrointestinal:  Abdomen soft, NT/ND, + BS Extremities:  No C/E/C   Data Reviewed:    Labs: Basic  Metabolic Panel:  Recent Labs Lab 06/26/14 2130  06/27/14 0300 06/28/14 0446 06/29/14 0232 06/30/14 0254 07/01/14 0402 07/01/14 1657 07/02/14 0328  NA  --   --  135 133* 137 136 136  --   --   K  --   < > 4.1 3.2* 3.5 2.7* 2.6* 3.4*  --   CL  --   --  105 106 104 101 101  --   --   CO2  --   --  26 24 27 27 28   --   --   GLUCOSE  --   --  124* 132* 123* 114* 116*  --   --   BUN  --   --  7 9 11  9 12  --   --   CREATININE  --   --  0.52 0.43* 0.53 0.51 0.52  --   --   CALCIUM  --   --  7.8* 7.6* 7.8* 7.9* 7.8*  --   --   MG  --   < > 2.1 1.9 2.0 2.0 2.0 2.0 1.9  PHOS 2.3  --   --   --   --   --   --   --   --   < > = values in this interval not displayed. GFR Estimated Creatinine Clearance: 76.8 mL/min (by C-G formula based on Cr of 0.52). Liver Function Tests:  Recent Labs Lab 06/27/14 0300 06/28/14 0446 06/29/14 0232 06/30/14 0254 07/01/14 0402  AST 37 27 42* 39* 31  ALT 32 28 37 42 43  ALKPHOS 52 45 43 47 51  BILITOT 0.4 0.4 0.4 0.5 0.4  PROT 5.0* 4.9* 4.6* 4.7* 4.7*  ALBUMIN 2.2* 1.9* 1.9* 1.9* 1.9*   CBC:  Recent Labs Lab 06/28/14 0446 06/29/14 0232 06/30/14 0254 07/01/14 0402 07/02/14 0328  WBC 11.1* 6.7 7.9 9.8 10.5  NEUTROABS 9.7* 5.5 6.4 7.6 8.1*  HGB 9.7* 9.4* 9.9* 9.9* 10.0*  HCT 28.4* 28.3* 28.9* 28.9* 29.6*  MCV 85.5 86.3 85.8 86.5 88.9  PLT 359 361 400 398 370   CBG:  Recent Labs Lab 07/01/14 1628 07/02/14 0736 07/02/14 1237 07/02/14 1540 07/02/14 2350  GLUCAP 151* 95 107* 113* 111*   Microbiology Recent Results (from the past 240 hour(s))  Body fluid culture     Status: None   Collection Time: 06/25/14  3:15 PM  Result Value Ref Range Status   Specimen Description FLUID RIGHT PLEURAL  Final   Special Requests Normal  Final   Gram Stain   Final    FEW WBC PRESENT, PREDOMINANTLY PMN NO ORGANISMS SEEN Performed at [facility]    Culture   Final    NO GROWTH 3 DAYS Performed at [facility]    Report Status  06/29/2014 FINAL  Final  AFB culture with smear     Status: None (Preliminary result)   Collection Time: 06/25/14  3:15 PM  Result Value Ref Range Status   Specimen Description FLUID RIGHT PLEURAL  Final   Special Requests Normal  Final   Acid Fast Smear   Final    NO ACID FAST BACILLI SEEN Performed at [facility]    Culture   Final    CULTURE WILL BE EXAMINED FOR 6 WEEKS BEFORE ISSUING A FINAL REPORT Performed at [facility]    Report Status PENDING  Incomplete  Fungal stain     Status: None   Collection Time: 06/25/14  3:15 PM  Result Value Ref Range Status   Specimen Description FLUID RIGHT PLEURAL  Final   Special Requests Normal  Final   Fungal Smear   Final    NO YEAST OR FUNGAL ELEMENTS SEEN Performed at [facility]    Report Status 06/26/2014 FINAL  Final  AFB culture with smear     Status: None (Preliminary result)   Collection Time: 06/28/14 11:19 AM  Result Value Ref Range Status   Specimen Description BRONCHIAL ALVEOLAR LAVAGE  Final   Special Requests Normal  Final   Acid Fast Smear   Final    NO ACID FAST BACILLI SEEN Performed at [facility]    Culture   Final    CULTURE WILL BE  EXAMINED FOR 6 WEEKS BEFORE ISSUING A FINAL REPORT Performed at [facility]    Report Status PENDING  Incomplete  Fungus Culture with Smear     Status: None (Preliminary result)   Collection Time: 06/28/14 11:19 AM  Result Value Ref Range Status   Specimen Description BRONCHIAL ALVEOLAR LAVAGE  Final   Special Requests Normal  Final   Fungal Smear   Final    NO YEAST OR FUNGAL ELEMENTS SEEN Performed at [facility]    Culture   Final    CANDIDA ALBICANS Performed at [facility]    Report Status PENDING  Incomplete  Culture, bal-quantitative     Status: None   Collection Time: 06/28/14 11:19 AM  Result Value Ref Range Status   Specimen Description BRONCHIAL ALVEOLAR LAVAGE  Final   Special Requests NONE   Final   Gram Stain   Final    FEW WBC PRESENT,BOTH PMN AND MONONUCLEAR RARE SQUAMOUS EPITHELIAL CELLS PRESENT NO ORGANISMS SEEN Performed at [facility]   Final    25,000 COLONIES/ML Performed at [facility]    Culture   Final    CANDIDA ALBICANS Performed at [facility]    Report Status 06/30/2014 FINAL  Final     Medications:   . antiseptic oral rinse  7 mL Mouth Rinse q12n4p  . chlorhexidine  15 mL Mouth Rinse BID  . dexamethasone  6 mg Oral Q12H  . dronabinol  5 mg Oral BID AC  . feeding supplement (ENSURE COMPLETE)  237 mL Oral TID BM  . fluconazole  100 mg Oral Daily  . heparin  5,000 Units Subcutaneous 3 times per day  . insulin aspart  0-15 Units Subcutaneous TID WC  . multivitamin with minerals  1 tablet Oral Daily  . piperacillin-tazobactam (ZOSYN)  IV  3.375 g Intravenous Q8H   Continuous Infusions:   Time spent: 35 minutes.  The patient is medically complex and requires high complexity decision making.    LOS: 13 days   [facility] Pager [phone]. If unable to reach me by pager, please call my cell phone at [phone].  *Please refer to [email or web address], password [email or web address] to get updated schedule on who will round on this patient, as hospitalists switch teams weekly. If 7PM-7AM, please contact night-coverage at [email or web address], password [email or web address] for any overnight needs.  07/03/2014, 7:50 AM

## 2014-07-04 ENCOUNTER — Ambulatory Visit
Admit: 2014-07-04 | Discharge: 2014-07-04 | Disposition: A | Discharge status: Home / Self Care | Payer: MEDICAID | Attending: Radiation Oncology | Admitting: Radiation Oncology

## 2014-07-04 ENCOUNTER — Ambulatory Visit: Payer: MEDICAID | Admitting: Radiation Oncology

## 2014-07-04 ENCOUNTER — Ambulatory Visit
Admit: 2014-07-04 | Discharge: 2014-07-04 | Disposition: A | Discharge status: Home / Self Care | Payer: Medicaid Other | Source: Ambulatory Visit | Attending: Radiation Oncology | Admitting: Radiation Oncology

## 2014-07-04 ENCOUNTER — Ambulatory Visit
Admit: 2014-07-04 | Discharge: 2014-07-04 | Disposition: A | Discharge status: Home / Self Care | Payer: Medicaid Other | Attending: Radiation Oncology | Admitting: Radiation Oncology

## 2014-07-04 DIAGNOSIS — C3431 Malignant neoplasm of lower lobe, right bronchus or lung: Secondary | ICD-10-CM | POA: Diagnosis not present

## 2014-07-04 DIAGNOSIS — R42 Dizziness and giddiness: Secondary | ICD-10-CM

## 2014-07-04 DIAGNOSIS — Z87891 Personal history of nicotine dependence: Secondary | ICD-10-CM | POA: Insufficient documentation

## 2014-07-04 DIAGNOSIS — D638 Anemia in other chronic diseases classified elsewhere: Secondary | ICD-10-CM | POA: Insufficient documentation

## 2014-07-04 LAB — GLUCOSE, CAPILLARY
GLUCOSE-CAPILLARY: 132 mg/dL — AB (ref 70–99)
GLUCOSE-CAPILLARY: 161 mg/dL — AB (ref 70–99)
Glucose-Capillary: 181 mg/dL — ABNORMAL HIGH (ref 70–99)
Glucose-Capillary: 151 mg/dL — ABNORMAL HIGH (ref 70–99)

## 2014-07-04 LAB — COMPREHENSIVE METABOLIC PANEL
ALT: 30 U/L (ref 0–53)
ANION GAP: 7 (ref 5–15)
AST: 19 U/L (ref 0–37)
Albumin: 2.5 g/dL — ABNORMAL LOW (ref 3.5–5.2)
Alkaline Phosphatase: 63 U/L (ref 39–117)
BILIRUBIN TOTAL: 0.5 mg/dL (ref 0.3–1.2)
BUN: 18 mg/dL (ref 6–23)
CHLORIDE: 99 mmol/L (ref 96–112)
CO2: 27 mmol/L (ref 19–32)
Calcium: 8.2 mg/dL — ABNORMAL LOW (ref 8.4–10.5)
Creatinine, Ser: 0.58 mg/dL (ref 0.50–1.35)
GFR calc Af Amer: >90 mL/min (ref >90)
Glucose, Bld: 143 mg/dL — ABNORMAL HIGH (ref 70–99)
Potassium: 3.8 mmol/L (ref 3.5–5.1)
SODIUM: 133 mmol/L — AB (ref 135–145)
Total Protein: 5.5 g/dL — ABNORMAL LOW (ref 6.0–8.3)

## 2014-07-04 LAB — CBC
HCT: 33.2 % — ABNORMAL LOW (ref 39.0–52.0)
Hemoglobin: 11.5 g/dL — ABNORMAL LOW (ref 13.0–17.0)
MCH: 31.2 pg (ref 26.0–34.0)
MCHC: 34.6 g/dL (ref 30.0–36.0)
MCV: 90 fL (ref 78.0–100.0)
Platelets: 378 10*3/uL (ref 150–400)
RBC: 3.69 MIL/uL — ABNORMAL LOW (ref 4.22–5.81)
RDW: 17.6 % — AB (ref 11.5–15.5)
WBC: 9.5 10*3/uL (ref 4.0–10.5)

## 2014-07-04 NOTE — Progress Notes (Signed)
  Radiation Oncology         [phone]) [phone] ________________________________  Name: Dustin Freeman MRN: [id number]  Date: 06/20/2014  DOB: June 22, 1955  SIMULATION AND TREATMENT PLANNING NOTE Special Treatment Procedure Note:  Inpatient  DIAGNOSIS:  C34.31 right lower lung cancer   NARRATIVE:  The patient was brought to the [place] suite.  Identity was confirmed.  All relevant records and images related to the planned course of therapy were reviewed.  The patient freely provided informed written consent to proceed with treatment after reviewing the details related to the planned course of therapy. The consent form was witnessed and verified by the simulation staff.    Then, the patient was set-up in a stable reproducible  supine position for radiation therapy.  CT images were obtained.  Surface markings were placed.  The CT images were loaded into the planning software.    RESPIRATORY MOTION MANAGEMENT SIMULATION  NARRATIVE:  In order to account for effect of respiratory motion on target structures and other organs in the planning and delivery of radiotherapy, this patient underwent respiratory motion management simulation.  To accomplish this, when the patient was brought to the CT simulation planning suite, 4D respiratoy motion management CT images were obtained.  The CT images were loaded into the planning software.  Then, using a variety of tools including Cine, MIP, and standard views, the target volume and planning target volumes (PTV) were delineated.  Avoidance structures were contoured.  Treatment planning then occurred.    TREATMENT PLANNING NOTE: Treatment planning then occurred.  The radiation prescription was entered and confirmed.    A total of 6 medically necessary complex treatment devices were fabricated and supervised by me (6 fields with MLCs to protect the following normal organs): I have requested : 3D Simulation  I have requested a DVH of the following  structures: heart lungs cord esophagus and target volumes.    The patient will receive 60 Gy in 30 fractions.  Special Treatment Procedure Note: The patient will be receiving chemotherapy concurrently. Chemotherapy heightens the risk of side effects. I have considered this during the patient's treatment planning process and will monitor the patient accordingly for side effects on a weekly basis. Concurrent chemotherapy increases the complexity of this patient's treatment and therefore this constitutes a special treatment procedure.   -----------------------------------  Eppie Gibson, MD

## 2014-07-04 NOTE — Progress Notes (Signed)
Spoke with Greggory Stallion, RN for patient in room 1322. She verified that patient is only receiving Insulin in hospital for his diabetes control. She verbally listed every medication he is receiving. Jehnna RT, ct sim dept notified by phone.

## 2014-07-04 NOTE — Care Management Note (Signed)
CARE MANAGEMENT NOTE 07/04/2014  Patient:  Dustin Freeman, Dustin Freeman   Account Number:  [id number]  Date Initiated:  06/21/2014  Documentation initiated by:  [facility]  Subjective/Objective Assessment:   Admitted with suspect pnuemonia - hypotension - fluid bolus's     Action/Plan:   homeless   Anticipated DC Date:  07/06/2014   Anticipated DC Plan:  HOMELESS SHELTER  In-house referral  Clinical Social Worker      [place]  CM consult      Choice offered to / List presented to:             Status of service:  In process, will continue to follow Medicare Important Message given?  NO (If response is "NO", the following Medicare IM given date fields will be blank) Date Medicare IM given:   Medicare IM given by:   Date Additional Medicare IM given:   Additional Medicare IM given by:    Discharge Disposition:    Per UR Regulation:  Reviewed for med. necessity/level of care/duration of stay  If discussed at [place] of Stay Meetings, dates discussed:   06/26/2014  06/28/2014    Comments:  Contact:  Ruder,Rita Spouse [phone]  07/04/14 Marney Doctor RN,BSN,NCM [phone] Pt transferred to [place] from [facility] to start chemotherapy. PT to eval pt.  CM will follow and assist with DC needs.  06/25/2014 @ 11:00  Whitman Hero RN,BSN,CM [phone] Pt  is homeless, per CSW  pt states has relationship with [employer].  CSW will call [facility] regarding assist with medications. Medicare should go into effect on 07/04/2014 per pt. Will continue to monitor for discharge needs.  06-21-14 10:35am Luz Lex, RNBSN [phone] Notes say patient homeless. - showing he has a wife?? Talked with patient.  He has been married for 37 years. Both he and his wife have been living in a hotel, but today they were to move into a house.  Have a county SW assisting them.  Wife should be in house today.  Will notify SW to follow.  CM will continue to follow also for any dc needs.

## 2014-07-04 NOTE — Clinical Social Work Note (Addendum)
CSW informed that pt transfered to [facility]. CSW spoke with CSW Alison Murray, LCSW at [facility]. CSW provided a verbal handoff regarding the pt receiving aid through [facility] for homelessness. At this time CSW will sign off.    [place], MSW, [place]

## 2014-07-04 NOTE — Progress Notes (Addendum)
Patient ID: Dustin Freeman, male   DOB: March 10, 1956, 59 y.o.   MRN: [id number] [facility] PROGRESS NOTE  RENEE BEALE SWF:[id number] DOB: December 16, 1955 DOA: 06/20/2014 PCP: No PCP Per Patient  Brief narrative:    59 y.o. male with a PMH of DM type II and PVD s/p stent placement right external iliac artery, brought to [facility] ED 06/20/14 for dizziness, cough, diarrhea. Hospitalized 3 weeks prior for syncope and underwent stent placement in occluded right external iliac artery. Ever since being discharged had diarrhea. On this admission, patient also reported weight loss of about 10 pounds and he reported history of smoking half pack per day for 48 years only recently quit. On this admission, workup revealed a large right infrahilar mass with suspicious right hilar and subcarinal adenopathy and postobstructive process, subtotal right lower lung collapse. In addition he was noted to have diffuse infiltrative process and bilateral pleural effusions and he underwent thoracentesis twice since this admission. 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 Oncology and radiation oncology on board.   Assessment/Plan:    Principal problem:  Extensive RLL, RML, and partial RUL postobustructive pneumonia / bilateral pleural effusion - Patient was started on broad-spectrum antibiotics for treatment of postobstructive pneumonia. He is currently on Zosyn only which we will continue for next 24 hours and then we will stop it after total of 2 week treatment. - Patient is status post thoracentesis on 06/25/2014 which yielded 1200 ml fluid. Cytology results nondiagnostic. Thoracentesis also done 3/12 with 600 cc fluid removed.  Active Problem: Large soft tissue mass in the right hilum / Squamous cell carcinoma  - Mass obstructs the bronchus intermedius. - Oncology and radiation oncology on board. - Plan for chemotherapy once PAC placed. -  MRI of the brain is negative for metastasis. Bone scan with no bony metastasis.  Fungus on BAL  - per Dr. Bobby Rumpf (ID), given that right bronchus intermedius was totally occluded would not treat at this time. - Patient is on fluconazole 100 mg daily  Hypovolemic Shock / Sepsis secondary to PNA  - Resolved. - Wean stress dose steroids. 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  Hyperglycemia, steroid-induced - Hemoglobin A1c 6%. - Continue sliding scale insulin.  Diarrhea  - Resolved.   Anemia of chronic disease - Likely because of malignancy.  PVD s/p stent placement to right external iliac artery 05/15/14 (Dr. Trula Slade) -  Stop Plavix prior to Port-A-Cath placement  Severe protein calorie malnutrition - In the context of chronic illness, malignancy. Continue Marinol and nutritional supplementation.   DVT Prophylaxis  -Heparin subQ ordered    Code Status: Full.  Family Communication:  plan of care discussed with the patient Disposition Plan: Home when stable. Plan for chemo in hospital.  IV access:  Peripheral IV  Procedures and diagnostic studies:    MRI brain 07/03/2014 - no evidence of intracranial metastasis  NM whole body bone scan 07/03/2014 - no findings suspicious for bony metastasis  Dg Chest 1 View 06/30/2014 1. Interval reduction/near resolution of left-sided effusion post thoracentesis. No pneumothorax. 2. Interval increase in small to moderate-sized potentially partially loculated effusion with worsening right mid and lower lung heterogeneous/consolidative opacities. Electronically Signed By: Sandi Mariscal M.D. On: 06/30/2014 12:54   US Thoracentesis Asp Pleural Space W/img Guide  06/30/2014 Successful ultrasound-guided left sided thoracentesis yielding 660 cc of pleural fluid.  Ct Abdomen Pelvis  W Contrast 06/29/2014 RIGHT infrahilar mass 4.3 x 4.0 cm with suspected RIGHT hilar adenopathy. Emphysematous changes with subtotal collapse of RIGHT lower lobe with slight pulmonary infiltrates question pneumonia versus lymphangitic tumor. Ascites and diffuse soft tissue edema question anasarca versus hypoproteinemia. No acute intra-abdominal or intrapelvic metastases.   Dg Chest Port 1 View 06/25/2014 Bilateral lower lobe airspace opacities and layering effusions, both worsening on the left since prior study. No pneumothorax.  Ct Chest W Contrast 3/4/2016Severe post obstructive pneumonia right lower lobe with extension of pneumonia into the right middle lobe and right upper lobe. Large soft tissue mass in the right hilum obstructs the bronchus intermedius. Malignancy suspected There are bilateral pleural effusions. There is infiltrate in the left lower lobe which, although dependent in position, is concerning for pneumonia as well. Significant atelectasis is an alternative consideration.  Dg Chest Port 1 View 3/3/2016Continue slight worsening of right basilar pneumonia, possibly postobstructive in etiology based on prior studies.  Dg Chest Port 1 View 3/3/20161. Worsening of infiltrate in the right lung base most consistent with pneumonia. Small areas a cavitation cannot be excluded within this focus of pneumonia. Consider followup chest x-ray or CT chest. 2. Hyperaeration consistent with emphysema.  Dg Abd Acute W/chest 06/20/2014 Right lower lobe consolidation compatible with pneumonia. COPD.    Medical Consultants:  Dr. Burney Gauze, Oncology Dr. Eppie Gibson, Radiation Oncology IR Dr. Bobby Rumpf, ID  IAnti-Infectives:   Cefepime 06/20/14--> 06/21/14 Levaquin 06/20/14--->06/20/14 Vanc 06/20/14--- > 06/22/14 Zosyn 06/22/14--->   Leisa Lenz, MD  [facility] Pager [phone]  If 7PM-7AM, please contact night-coverage [email or web address] Password [email or web address] 07/04/2014,  10:45 AM   LOS: 14 days    HPI/Subjective: No acute overnight events.  Objective: Filed Vitals:   07/03/14 0500 07/03/14 1318 07/03/14 2158 07/04/14 0523  BP:  142/65 121/72 133/66  Pulse:  55 66 62  Temp:  97.6 F (36.4 C) 98 F (36.7 C) 97.3 F (36.3 C)  TempSrc:  Oral Oral Oral  Resp:  16 16 16   Height:      Weight: 55.339 kg (122 lb)   55.2 kg (121 lb 11.1 oz)  SpO2:  98% 97% 98%    Intake/Output Summary (Last 24 hours) at 07/04/14 1045 Last data filed at 07/04/14 0906  Gross per 24 hour  Intake    780 ml  Output   2550 ml  Net  -1770 ml    Exam:   General:  Pt is alert, follows commands appropriately, not in acute distress  Cardiovascular: Regular rate and rhythm, S1/S2, no murmurs  Respiratory: Clear to auscultation bilaterally, no wheezing, no crackles, no rhonchi  Abdomen: Soft, non tender, non distended, bowel sounds present  Extremities: No edema, pulses DP and PT palpable bilaterally  Neuro: Grossly nonfocal  Data Reviewed: Basic Metabolic Panel:  Recent Labs Lab 06/28/14 0446 06/29/14 0232 06/30/14 0254 07/01/14 0402 07/01/14 1657 07/02/14 0328 07/04/14 0435  NA 133* 137 136 136  --   --  133*  K 3.2* 3.5 2.7* 2.6* 3.4*  --  3.8  CL 106 104 101 101  --   --  99  CO2 24 27 27 28   --   --  27  GLUCOSE 132* 123* 114* 116*  --   --  143*  BUN 9 11 9 12   --   --  18  CREATININE 0.43* 0.53 0.51 0.52  --   --  0.58  CALCIUM 7.6* 7.8* 7.9* 7.8*  --   --  8.2*  MG 1.9 2.0 2.0 2.0 2.0 1.9  --    Liver Function Tests:  Recent Labs Lab 06/28/14 0446 06/29/14 0232 06/30/14 0254 07/01/14 0402 07/04/14 0435  AST 27 42* 39* 31 19  ALT 28 37 42 43 30  ALKPHOS 45 43 47 51 63  BILITOT 0.4 0.4 0.5 0.4 0.5  PROT 4.9* 4.6* 4.7* 4.7* 5.5*  ALBUMIN 1.9* 1.9* 1.9* 1.9* 2.5*   No results for input(s): LIPASE, AMYLASE in the last 168 hours. No results for input(s): AMMONIA in the last 168 hours. CBC:  Recent Labs Lab 06/28/14 0446  06/29/14 0232 06/30/14 0254 07/01/14 0402 07/02/14 0328 07/04/14 0435  WBC 11.1* 6.7 7.9 9.8 10.5 9.5  NEUTROABS 9.7* 5.5 6.4 7.6 8.1*  --   HGB 9.7* 9.4* 9.9* 9.9* 10.0* 11.5*  HCT 28.4* 28.3* 28.9* 28.9* 29.6* 33.2*  MCV 85.5 86.3 85.8 86.5 88.9 90.0  PLT 359 361 400 398 370 378   Cardiac Enzymes: No results for input(s): CKTOTAL, CKMB, CKMBINDEX, TROPONINI in the last 168 hours. BNP: Invalid input(s): POCBNP CBG:  Recent Labs Lab 07/03/14 0734 07/03/14 1228 07/03/14 1558 07/03/14 2156 07/04/14 0732  GLUCAP 98 104* 186* 137* 132*    Recent Results (from the past 240 hour(s))  Body fluid culture     Status: None   Collection Time: 06/25/14  3:15 PM  Result Value Ref Range Status   Specimen Description FLUID RIGHT PLEURAL  Final   Special Requests Normal  Final   Gram Stain   Final    FEW WBC PRESENT, PREDOMINANTLY PMN NO ORGANISMS SEEN Performed at [facility]    Culture   Final    NO GROWTH 3 DAYS Performed at [facility]    Report Status 06/29/2014 FINAL  Final  AFB culture with smear     Status: None (Preliminary result)   Collection Time: 06/25/14  3:15 PM  Result Value Ref Range Status   Specimen Description FLUID RIGHT PLEURAL  Final   Special Requests Normal  Final   Acid Fast Smear   Final    NO ACID FAST BACILLI SEEN Performed at [facility]    Culture   Final    CULTURE WILL BE EXAMINED FOR 6 WEEKS BEFORE ISSUING A FINAL REPORT Performed at [facility]    Report Status PENDING  Incomplete  Fungal stain     Status: None   Collection Time: 06/25/14  3:15 PM  Result Value Ref Range Status   Specimen Description FLUID RIGHT PLEURAL  Final   Special Requests Normal  Final   Fungal Smear   Final    NO YEAST OR FUNGAL ELEMENTS SEEN Performed at [facility]    Report Status 06/26/2014 FINAL  Final  AFB culture with smear     Status: None (Preliminary result)   Collection Time: 06/28/14 11:19 AM   Result Value Ref Range Status   Specimen Description BRONCHIAL ALVEOLAR LAVAGE  Final   Special Requests Normal  Final   Acid Fast Smear   Final    NO ACID FAST BACILLI SEEN Performed at [facility]    Culture   Final    CULTURE WILL BE EXAMINED FOR 6 WEEKS BEFORE ISSUING A FINAL REPORT Performed at [facility]    Report Status PENDING  Incomplete  Fungus Culture with Smear     Status: None (Preliminary  result)   Collection Time: 06/28/14 11:19 AM  Result Value Ref Range Status   Specimen Description BRONCHIAL ALVEOLAR LAVAGE  Final   Special Requests Normal  Final   Fungal Smear   Final    NO YEAST OR FUNGAL ELEMENTS SEEN Performed at [facility]    Culture   Final    CANDIDA ALBICANS Performed at [facility]    Report Status PENDING  Incomplete  Culture, bal-quantitative     Status: None   Collection Time: 06/28/14 11:19 AM  Result Value Ref Range Status   Specimen Description BRONCHIAL ALVEOLAR LAVAGE  Final   Special Requests NONE  Final   Gram Stain   Final    FEW WBC PRESENT,BOTH PMN AND MONONUCLEAR RARE SQUAMOUS EPITHELIAL CELLS PRESENT NO ORGANISMS SEEN Performed at [facility]   Final    25,000 COLONIES/ML Performed at [facility]    Culture   Final    CANDIDA ALBICANS Performed at [facility]    Report Status 06/30/2014 FINAL  Final     Scheduled Meds: . antiseptic oral rinse  7 mL Mouth Rinse q12n4p  . chlorhexidine  15 mL Mouth Rinse BID  . dexamethasone  6 mg Oral Q12H  . dronabinol  5 mg Oral BID AC  . feeding supplement (ENSURE COMPLETE)  237 mL Oral TID BM  . fluconazole  100 mg Oral Daily  . heparin  5,000 Units Subcutaneous 3 times per day  . insulin aspart  0-15 Units Subcutaneous TID WC  . multivitamin with minerals  1 tablet Oral Daily  . piperacillin-tazobactam (ZOSYN)  IV  3.375 g Intravenous Q8H  . polyethylene glycol  17 g Oral Daily   Continuous  Infusions:

## 2014-07-04 NOTE — Progress Notes (Signed)
He still does not have the Port-A-Cath in. I think this will be done today.  He had a negative MRI of the brain. His bone scan was negative.  He says he feels dizzy when he tries to walk. He is was had a low heart rate. I wonder if this not be causing some of the dizziness. He may need to be on a monitor to see his doctor was gone with his heart rate.  He says he's eating well.  I think that physical therapy might need to be involved. He is quite deconditioned.  His labs do not  look all that bad. His calcium is 8.2. Albumin 2.5. Hemoglobin 11.5. White cell count 9.5. Potassium 3.8.  On his physical exam, his temperature 97.3. Pulse 62. Blood pressure 133/66. Oral exam shows no mucositis. Lungs show some wheezes over on the right side. He has decent air movement. Abdomen is soft. Extremities show some muscle atrophy.  Once the Port-A-Cath gets put in, then we will start chemotherapy. This probably will be tomorrow.  He still has a very good attitude. He wants to try as hard as possible. Hopefully, physical therapy will help him.  Lum Keas  Lamentations 3:32-33

## 2014-07-04 NOTE — Evaluation (Signed)
Physical Therapy Evaluation Patient Details Name: Dustin Freeman MRN: [id number] DOB: December 08, 1955 Today's Date: 07/04/2014   History of Present Illness  59 y.o. male with a PMH of DM type II and PVD s/p stent placement right external iliac artery, admitted 06/20/14 for Extensive RLL, RML, and partial RUL postobustructive pneumonia / bilateral pleural effusion as well Large soft tissue mass in the right hilum / Squamous cell carcinoma.  Plan is for radiation and chemotherapy.  Clinical Impression  Pt admitted with above diagnosis. Pt currently with functional limitations due to the deficits listed below (see PT Problem List).  Pt will benefit from skilled PT to increase their independence and safety with mobility to allow discharge to the venue listed below.  Pt requiring assist for balance with any standing activities at this time and presents as HIGH FALL RISK.  Pt states he is from motel.  Pt would best benefit from further rehab upon d/c at SNF.      Follow Up Recommendations SNF    Equipment Recommendations  Wheelchair (measurements PT);Rolling walker with 5" wheels;Wheelchair cushion (measurements PT)    Recommendations for Other Services       Precautions / Restrictions Precautions Precautions: Fall      Mobility  Bed Mobility Overal bed mobility: Needs Assistance Bed Mobility: Supine to Sit;Sit to Supine     Supine to sit: Supervision Sit to supine: Supervision      Transfers Overall transfer level: Needs assistance Equipment used: Rolling walker (2 wheeled) Transfers: Sit to/from Stand Sit to Stand: Mod assist         General transfer comment: verbal cues for safe technique, very unsteady with LOB posteriorly initially then returned to sitting, placed RW in front of pt to assist with steadying upon standing however continues to require assist  Ambulation/Gait Ambulation/Gait assistance: Mod assist Ambulation Distance (Feet): 10 Feet Assistive device: Rolling  walker (2 wheeled) Gait Pattern/deviations: Step-through pattern;Decreased stride length;Trunk flexed     General Gait Details: max verbal cues for safety with RW and balance, pt very unsteady with uncoordinated movement of LEs, only ambulated around pt's bed for safety, HIGH FALL RISK  Stairs            Wheelchair Mobility    Modified Rankin (Stroke Patients Only)       Balance Overall balance assessment: Needs assistance       Postural control: Posterior lean Standing balance support: Bilateral upper extremity supported Standing balance-Leahy Scale: Zero                               Pertinent Vitals/Pain Pain Assessment: No/denies pain    Home Living Family/patient expects to be discharged to:: Shelter/Homeless                 Additional Comments: states he was living in motel prior to admission    Prior Function Level of Independence: Independent               Hand Dominance        Extremity/Trunk Assessment   Upper Extremity Assessment: Generalized weakness           Lower Extremity Assessment: Generalized weakness         Communication   Communication: No difficulties  Cognition Arousal/Alertness: Awake/alert Behavior During Therapy: WFL for tasks assessed/performed Overall Cognitive Status: Within Functional Limits for tasks assessed  General Comments      Exercises        Assessment/Plan    PT Assessment Patient needs continued PT services  PT Diagnosis Difficulty walking;Generalized weakness   PT Problem List Decreased strength;Decreased activity tolerance;Decreased coordination;Decreased mobility;Decreased balance;Decreased safety awareness;Decreased knowledge of use of DME  PT Treatment Interventions DME instruction;Gait training;Functional mobility training;Patient/family education;Therapeutic activities;Therapeutic exercise;Balance training;Neuromuscular  re-education;Wheelchair mobility training   PT Goals (Current goals can be found in the Care Plan section) Acute Rehab PT Goals PT Goal Formulation: With patient Time For Goal Achievement: 07/18/14 Potential to Achieve Goals: Good    Frequency Min 3X/week   Barriers to discharge        Co-evaluation               End of Session Equipment Utilized During Treatment: Gait belt Activity Tolerance: Patient limited by fatigue Patient left: in bed;with call bell/phone within reach;with bed alarm set           Time: 1335-1346 PT Time Calculation (min) (ACUTE ONLY): 11 min   Charges:   PT Evaluation $Initial PT Evaluation Tier I: 1 Procedure     PT G Codes:        Dustin Freeman,Dustin Freeman 07/04/2014, 3:22 PM Dustin Freeman, PT, DPT 07/04/2014 Pager: [phone]

## 2014-07-04 NOTE — Progress Notes (Signed)
Radiation Oncology         [phone]) [phone] ________________________________  Initial inpatient Consultation  Name: Dustin Freeman MRN: [id number]  Date: 06/20/2014  DOB: 01-24-1956  CC:No PCP Per Patient  No ref. 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 male who presented with dizziness, cough, SOB and syncope x3.  Weight loss of about 10 lb in one year.  He smoked 1/2 ppd x 48 yrs and quit recently.  He was admitted on 06-20-14 and workup revealed a large right infrahilar mass with suspicious right hilar / subcarinal adenopathy and postobstructive process / subtotal RLL collapse; he is receiving treatment for PNA as there also appears to be a diffuse infiltrative process.  He has bilateral pleural effusions and has undergone thoracentesis twice since admission - pleural fluid is negative for malignancy.  MRI of brain and bone scan were negative for metastatic disease yesterday.  No clear evidence of metastatic disease per CT of A/P.  PET cannot be done as an inpatient. He has seen Dr Marin Olp with plans to start chemotherapy soon.  He denies pain or dysphagia.  PATHOLOGY: 06-28-14 Diagnosis Bronchus, biopsy, bronchus intermedius - SQUAMOUS CELL CARCINOMA. - SEE COMMENT. Microscopic Comment The carcinoma appears poorly differentiated.   Pleural fluid from 3-7 and 06-30-14 was negative for malignancy.  PREVIOUS RADIATION THERAPY: No  PAST MEDICAL HISTORY:  has a past medical history of History of palpitations; DM (diabetes mellitus); PVD (peripheral vascular disease); Collagen vascular disease; and Cancer.    PAST SURGICAL HISTORY: Past Surgical History  Procedure Laterality Date  . Lower extremity angiogram N/A 05/15/2014    Procedure: LOWER EXTREMITY ANGIOGRAM;  Surgeon: Serafina Mitchell, MD;  Location: [facility] CATH LAB;  Service:  Cardiovascular;  Laterality: N/A;  . Video bronchoscopy Bilateral 06/28/2014    Procedure: VIDEO BRONCHOSCOPY WITHOUT FLUORO;  Surgeon: Rush Farmer, MD;  Location: [facility] ENDOSCOPY;  Service: Endoscopy;  Laterality: Bilateral;    FAMILY HISTORY: family history includes Breast cancer in his cousin; Diabetes Mellitus I in his mother; Kidney disease in his cousin; Leukemia in his maternal grandfather and maternal uncle; Peripheral vascular disease in his father.  SOCIAL HISTORY:  reports that he quit smoking about 6 weeks ago. His smoking use included Cigarettes. He has a 23.5 pack-year smoking history. He does not have any smokeless tobacco history on file. He reports that he does not drink alcohol or use illicit drugs. Lives with wife in [place], works at times in a Art therapist.  ALLERGIES: Review of patient's allergies indicates no known allergies.  MEDICATIONS:  Current Facility-Administered Medications  Medication Dose Route Frequency Provider Last Rate Last Dose  . acetaminophen (TYLENOL) tablet 650 mg  650 mg Oral Q6H PRN Cherene Altes, MD   650 mg at 06/28/14 0856  . acetaminophen-codeine (TYLENOL #3) 300-30 MG per tablet 1-2 tablet  1-2 tablet Oral Q6H PRN Cherene Altes, MD   2 tablet at 06/26/14 0805  . antiseptic oral rinse (CPC / CETYLPYRIDINIUM CHLORIDE 0.05%) solution 7 mL  7 mL Mouth Rinse q12n4p Wilhelmina Mcardle, MD   7 mL at 07/03/14 1400  . chlorhexidine (PERIDEX) 0.12 % solution 15 mL  15 mL Mouth Rinse BID Wilhelmina Mcardle, MD   15 mL at 07/03/14 2011  . chlorpheniramine-HYDROcodone (TUSSIONEX) 10-8 MG/5ML suspension 5 mL  5  mL Oral Q12H PRN Dianne Dun, NP   5 mL at 06/26/14 2129  . dexamethasone (DECADRON) tablet 6 mg  6 mg Oral Q12H [name], MD   6 mg at 07/03/14 2251  . dronabinol (MARINOL) capsule 5 mg  5 mg Oral BID AC Allie Bossier, MD   5 mg at 07/03/14 1722  . feeding supplement (ENSURE COMPLETE) (ENSURE COMPLETE) liquid 237 mL  237 mL Oral TID BM  Ardeen Garland, RD   237 mL at 07/03/14 2011  . fluconazole (DIFLUCAN) tablet 100 mg  100 mg Oral Daily [name], MD   100 mg at 07/03/14 1249  . heparin injection 5,000 Units  5,000 Units Subcutaneous 3 times per day Rahul Dianna Rossetti, PA-C   5,000 Units at 07/04/14 0615  . insulin aspart (novoLOG) injection 0-15 Units  0-15 Units Subcutaneous TID WC Cherene Altes, MD   3 Units at 07/03/14 1722  . ipratropium-albuterol (DUONEB) 0.5-2.5 (3) MG/3ML nebulizer solution 3 mL  3 mL Nebulization Q6H PRN Allie Bossier, MD      . LORazepam (ATIVAN) injection 0.5-1 mg  0.5-1 mg Intravenous Q4H PRN Allie Bossier, MD   1 mg at 06/28/14 1727  . morphine 2 MG/ML injection 1-2 mg  1-2 mg Intravenous Q3H PRN Cherene Altes, MD      . multivitamin with minerals tablet 1 tablet  1 tablet Oral Daily Ardeen Garland, RD   1 tablet at 07/03/14 1248  . oxyCODONE (Oxy IR/ROXICODONE) immediate release tablet 5 mg  5 mg Oral Q6H PRN Cherene Altes, MD   5 mg at 07/01/14 2302  . piperacillin-tazobactam (ZOSYN) IVPB 3.375 g  3.375 g Intravenous Q8H Cherene Altes, MD   3.375 g at 07/04/14 0229  . polyethylene glycol (MIRALAX / GLYCOLAX) packet 17 g  17 g Oral Daily Venetia Maxon Rama, MD   17 g at 07/03/14 1248    REVIEW OF SYSTEMS:  Notable for that above.   PHYSICAL EXAM:  height is 5\' 9"  (1.753 m) and weight is 121 lb 11.1 oz (55.2 kg). His oral temperature is 97.3 F (36.3 C). His blood pressure is 133/66 and his pulse is 62. His respiration is 16 and oxygen saturation is 98%.   General: Alert and oriented, in no acute distress; thin HEENT: Head is normocephalic. Extraocular movements are intact. Oropharynx is clear. Neck: Neck is supple, no palpable cervical or supraclavicular lymphadenopathy. Heart: Regular in rate and rhythm with no murmurs, rubs, or gallops. Chest: decreased sounds throughout, but especially in RLL Abdomen: Soft, nontender, nondistended, with no rigidity or  guarding. Extremities: No cyanosis or edema. Lymphatics: see Neck Exam Skin: No concerning lesions. Musculoskeletal: in wheelchair Neurologic: Cranial nerves II through XII are grossly intact. No obvious focalities. Speech is fluent.  Psychiatric: Judgment and insight are intact. Affect is appropriate.   ECOG = 2  0 - Asymptomatic (Fully active, able to carry on all predisease activities without restriction)  1 - Symptomatic but completely ambulatory (Restricted in physically strenuous activity but ambulatory and able to carry out work of a light or sedentary nature. For example, light housework, office work)  2 - Symptomatic, <50% in bed during the day (Ambulatory and capable of all self care but unable to carry out any work activities. Up and about more than 50% of waking hours)  3 - Symptomatic, >50% in bed, but not bedbound (Capable of only limited self-care, confined to bed  or chair 50% or more of waking hours)  4 - Bedbound (Completely disabled. Cannot carry on any self-care. Totally confined to bed or chair)  5 - Death   Eustace Pen MM, Creech RH, Tormey DC, et al. [phone]). "Toxicity and response criteria of the [facility]". [place] Oncol. 5 (6): 649-55   LABORATORY DATA:  Lab Results  Component Value Date   WBC 9.5 07/04/2014   HGB 11.5* 07/04/2014   HCT 33.2* 07/04/2014   MCV 90.0 07/04/2014   PLT 378 07/04/2014   CMP     Component Value Date/Time   NA 133* 07/04/2014 0435   K 3.8 07/04/2014 0435   CL 99 07/04/2014 0435   CO2 27 07/04/2014 0435   GLUCOSE 143* 07/04/2014 0435   BUN 18 07/04/2014 0435   CREATININE 0.58 07/04/2014 0435   CALCIUM 8.2* 07/04/2014 0435   PROT 5.5* 07/04/2014 0435   ALBUMIN 2.5* 07/04/2014 0435   AST 19 07/04/2014 0435   ALT 30 07/04/2014 0435   ALKPHOS 63 07/04/2014 0435   BILITOT 0.5 07/04/2014 0435   GFRNONAA >90 07/04/2014 0435   GFRAA >90 07/04/2014 0435         RADIOGRAPHY: Dg Chest 1  View  06/30/2014   CLINICAL DATA:  Post left-sided thoracentesis  EXAM: CHEST  1 VIEW  COMPARISON:  06/25/2014; 06/21/2014; chest CT -06/22/2014  FINDINGS: Grossly unchanged cardiac silhouette and mediastinal contours post median sternotomy and CABG with persistent partial obscuration of the right heart border secondary to interval increase in size of small to moderate partially loculated right-sided effusion with associated worsening right mid and lower lung heterogeneous/consolidative opacities.  Interval reduction/near resolution of left-sided effusion post thoracentesis. No pneumothorax. Improved aeration of the left lower lung.  Unchanged bones.  Regional soft tissues appear normal.  IMPRESSION: 1. Interval reduction/near resolution of left-sided effusion post thoracentesis. No pneumothorax. 2. Interval increase in small to moderate-sized potentially partially loculated effusion with worsening right mid and lower lung heterogeneous/consolidative opacities.   Electronically Signed   By: Sandi Mariscal M.D.   On: 06/30/2014 12:54   Ct Chest W Contrast  06/22/2014   CLINICAL DATA:  Peripheral vascular disease cardiac palpitations diabetes with cough and congestion currently, patient smokes, patient is homeless  EXAM: CT CHEST WITH CONTRAST  TECHNIQUE: Multidetector CT imaging of the chest was performed during intravenous contrast administration.  CONTRAST:  29mL OMNIPAQUE IOHEXOL 300 MG/ML  SOLN  COMPARISON:  06/21/2014  FINDINGS: There is extensive infiltrate throughout the right lower lobe. The right lower lobe is densely consolidated. Infiltrate extends into the posterior aspects of the right upper lobe and throughout much of the right middle lobe, which is less severely involved the lower lobe. There is abnormal soft tissue in the right hilum measuring about 9 cm anterior to posterior by 3 cm left right seen best on image number 45. This extends from the superior hilum inferiorly to the level of the coronary  sinus. This soft tissue is inseparable from the pericardium on the right, and causes mass effect on the bronchus intermedius, which is not visible as a result.  On the left there is mild to moderate deep tendon infiltrate in the lower lobe. There are small bilateral pleural effusions. There is mild pericardial thickening/ fluid. There is a 10 mm subcarinal lymph node.  There is coronary artery calcification. Minimal aortic calcifications is present. Thoracic inlet is normal. There are no acute musculoskeletal findings.  IMPRESSION: Severe post obstructive pneumonia right  lower lobe with extension of pneumonia into the right middle lobe and right upper lobe.  Large soft tissue mass in the right hilum obstructs the bronchus intermedius. Malignancy suspected  There are bilateral pleural effusions. There is infiltrate in the left lower lobe which, although dependent in position, is concerning for pneumonia as well. Significant atelectasis is an alternative consideration.   Electronically Signed   By: Skipper Cliche M.D.   On: 06/22/2014 17:53   Mr Jeri Cos JJ Contrast  07/03/2014   CLINICAL DATA:  Lung cancer staging.  EXAM: MRI HEAD WITHOUT AND WITH CONTRAST  TECHNIQUE: Multiplanar, multiecho pulse sequences of the brain and surrounding structures were obtained without and with intravenous contrast.  CONTRAST:  3mL MULTIHANCE GADOBENATE DIMEGLUMINE 529 MG/ML IV SOLN  COMPARISON:  Noncontrast brain MRI 05/14/2014  FINDINGS: Images are mildly degraded by motion artifact.  There is no evidence of acute infarct, intracranial hemorrhage, mass, midline shift, or extra-axial fluid collection. There is mild generalized cerebral atrophy. No significant white matter disease is identified. Mild enlargement of the cisterna magna is again noted. No abnormal enhancement is identified, although very small lesions could potentially be obscured given the motion artifact.  No suspicious osseous lesions are identified. Orbits are  unremarkable. Mild mucosal thickening is present in the ethmoid air cells, maxillary sinuses, and right sphenoid sinus. There are moderately large bilateral mastoid effusions. Major intracranial vascular flow voids are preserved with the distal right vertebral artery again appearing hypoplastic.  IMPRESSION: 1. No evidence of intracranial metastases. 2. Bilateral mastoid effusions.   Electronically Signed   By: Logan Bores   On: 07/03/2014 12:17   Nm Bone Scan Whole Body  07/03/2014   CLINICAL DATA:  History of lung malignancy  EXAM: NUCLEAR MEDICINE WHOLE BODY BONE SCAN  TECHNIQUE: Whole body anterior and posterior images were obtained approximately 3 hours after intravenous injection of radiopharmaceutical.  RADIOPHARMACEUTICALS:  24.0 mCi Technetium-99 MDP  COMPARISON:  None.  FINDINGS: There is adequate erect uptake of the radiopharmaceutical by the skeleton. Adequate soft tissue clearance and renal activity is noted. There is slight increased activity projecting over the mid upper pole of the left kidney.  Uptake within the calvarium, spine, pelvis, and ribs is within the limits of normal. Uptake over the pectoral and pelvic girdles is normal.  IMPRESSION: There are no findings suspicious for bony metastatic disease.   Electronically Signed   By: David  Martinique   On: 07/03/2014 12:33   Ct Abdomen Pelvis W Contrast  06/29/2014   CLINICAL DATA:  Abnormal chest CT with RIGHT hilar mass suspicious for lung cancer, question metastatic disease, history diabetes, collagen vascular disease, former smoker  EXAM: CT ABDOMEN AND PELVIS WITH CONTRAST  TECHNIQUE: Multidetector CT imaging of the abdomen and pelvis was performed using the standard protocol following bolus administration of intravenous contrast. Sagittal and coronal MPR images reconstructed from axial data set.  CONTRAST:  141mL OMNIPAQUE IOHEXOL 300 MG/ML SOLN IV. Dilute oral contrast.  COMPARISON:  CT chest 06/22/2014  FINDINGS: Bibasilar pleural  effusions.  Compressive atelectasis LEFT lower lobe.  Underlying emphysematous changes.  RIGHT infrahilar mass 4.3 x 4.0 cm image 6 with suspected RIGHT hilar adenopathy.  Subtotal collapse of RIGHT lower lobe with extensive interstitial infiltrates in the RIGHT lung, involving the posterior RIGHT upper lobe and RIGHT lower lobe, question pneumonia versus lymphangitic tumor spread.  Diffuse ascites and scattered edema of soft tissue planes throughout abdomen and pelvis question anasarca or hypoproteinemia.  Scattered atherosclerotic  calcifications.  Liver, spleen, pancreas, kidneys, and adrenal glands normal.  Minimally prominent stool in rectum and RIGHT colon.  Stomach and remaining bowel loops otherwise unremarkable for technique.  No mass, adenopathy, free fluid or inflammatory process.  No osseous metastatic lesions.  IMPRESSION: RIGHT infrahilar mass 4.3 x 4.0 cm with suspected RIGHT hilar adenopathy.  Emphysematous changes with subtotal collapse of RIGHT lower lobe with slight pulmonary infiltrates question pneumonia versus lymphangitic tumor.  Ascites and diffuse soft tissue edema question anasarca versus hypoproteinemia.  No acute intra-abdominal or intrapelvic metastases.   Electronically Signed   By: Lavonia Dana M.D.   On: 06/29/2014 19:53   Dg Chest Port 1 View  06/25/2014   CLINICAL DATA:  Post thoracentesis.  EXAM: PORTABLE CHEST - 1 VIEW  COMPARISON:  06/21/2014  FINDINGS: Bilateral lower lobe airspace opacities are noted, increasing on the left since prior study. Suspect layering effusions. , increasing on the left as well since prior study. No pneumothorax. There is a lying superimposed over the left upper lobe which simulates a pneumothorax, but lung markings are noted lateral to this line compatible with artifact.  Heart is borderline in size.  No acute bony abnormality.  IMPRESSION: Bilateral lower lobe airspace opacities and layering effusions, both worsening on the left since prior study. No  pneumothorax.   Electronically Signed   By: Rolm Baptise M.D.   On: 06/25/2014 15:51   Dg Chest Port 1 View  06/21/2014   CLINICAL DATA:  Hypoxia.  Respiratory distress.  Initial encounter.  EXAM: PORTABLE CHEST - 1 VIEW  COMPARISON:  Radiographs ranging from 05/13/2014 through 06/21/2014.  FINDINGS: 1642 hours. There has been further slight worsening of the right infrahilar airspace disease, most consistent with right middle and lower lobe pneumonia. This could be on a post obstructive basis. There is minimal patchy opacity in the left lower lobe. No edema, pneumothorax or significant pleural effusion identified. The heart size is stable. Multiple telemetry leads overlie the chest.  IMPRESSION: Continue slight worsening of right basilar pneumonia, possibly postobstructive in etiology based on prior studies.   Electronically Signed   By: Richardean Sale M.D.   On: 06/21/2014 16:57   Dg Chest Port 1 View  06/21/2014   CLINICAL DATA:  Hospital acquired pneumonia  EXAM: PORTABLE CHEST - 1 VIEW  COMPARISON:  Chest x-ray of 06/20/2014  FINDINGS: There has been worsening of opacity in the right mid lung and right lung base most consistent with pneumonia. Small lucencies within the right lung base could represent areas of cavitation and followup chest x-ray or CT chest is recommended. The lungs are somewhat hyperaerated suggesting emphysema. The heart is mildly enlarged. No bony abnormality is seen.  IMPRESSION: 1. Worsening of infiltrate in the right lung base most consistent with pneumonia. Small areas a cavitation cannot be excluded within this focus of pneumonia. Consider followup chest x-ray or CT chest. 2. Hyperaeration consistent with emphysema.   Electronically Signed   By: Ivar Drape M.D.   On: 06/21/2014 07:23   Dg Abd Acute W/chest  06/20/2014   CLINICAL DATA:  Right upper quadrant pain, cough for weeks.  EXAM: ACUTE ABDOMEN SERIES (ABDOMEN 2 VIEW & CHEST 1 VIEW)  COMPARISON:  05/13/2014  FINDINGS: New  airspace opacity noted in the right lower lobe concerning for pneumonia. Underlying COPD. Heart is normal size. Left lung is clear. No effusions.  Mild diffuse gaseous distention of the colon. No evidence of bowel obstruction. No organomegaly or suspicious calcification.  IMPRESSION: Right lower lobe consolidation compatible with pneumonia.  COPD.   Electronically Signed   By: Rolm Baptise M.D.   On: 06/20/2014 13:11   US Thoracentesis Asp Pleural Space W/img Guide  06/30/2014   INDICATION: History of lung cancer, now with symptomatic left-sided pleural effusion. Please perform left-sided thoracentesis for therapeutic and diagnostic purposes.  EXAM: US THORACENTESIS ASP PLEURAL SPACE W/IMG GUIDE  COMPARISON:  Chest CT - 06/22/2014; CT abdomen pelvis - 06/29/2014; chest radiograph - 06/25/2014  MEDICATIONS: None  COMPLICATIONS: None immediate  TECHNIQUE: Informed written consent was obtained from the patient after a discussion of the risks, benefits and alternatives to treatment. A timeout was performed prior to the initiation of the procedure.  Initial ultrasound scanning demonstrates a small to moderate-sized left-sided pleural effusion. The lower chest was prepped and draped in the usual sterile fashion. 1% lidocaine was used for local anesthesia.  Under direct ultrasound guidance, a 19 gauge, 7-cm, Yueh catheter was introduced. An ultrasound image was saved for documentation purposes. The thoracentesis was performed. The catheter was removed and a dressing was applied. The patient tolerated the procedure well without immediate post procedural complication. The patient was escorted to have an upright chest radiograph.  FINDINGS: A total of approximately 660 cc liters of serous fluid was removed. Requested samples were sent to the laboratory.  IMPRESSION: Successful ultrasound-guided left sided thoracentesis yielding 660 cc of pleural fluid.   Electronically Signed   By: Sandi Mariscal M.D.   On: 06/30/2014 13:27       IMPRESSION/PLAN:  Today, I talked to the patient about the findings and work-up thus far. We discussed the patient's diagnosis of locally advanced STAGE IIIA right lower lung squamous cell carcinoma and general treatment for this, highlighting the role of radiotherapy in the management. We discussed the available radiation techniques, and focused on the details of logistics and delivery.    We discussed the risks, benefits, and side effects of radiotherapy. Side effects may include but not necessarily be limited to: esophagitis, skin irritation, fatigue, and injury to internal organs of the chest. We discussed our techniques to minimize risk of serious side effects. No guarantees of treatment were given. A consent form was signed and placed in the patient's medical record.  The patient was encouraged to ask questions that I answered to the best of my ability.   We will simulate him today, anticipating start of radiotherapy on 07/09/14; he will receive 6-7 weeks of treatment. Anticipate concurrent chemotherapy.   __________________________________________   Eppie Gibson, MD

## 2014-07-05 LAB — GLUCOSE, CAPILLARY
GLUCOSE-CAPILLARY: 125 mg/dL — AB (ref 70–99)
GLUCOSE-CAPILLARY: 179 mg/dL — AB (ref 70–99)
Glucose-Capillary: 152 mg/dL — ABNORMAL HIGH (ref 70–99)

## 2014-07-05 MED ORDER — HEPARIN SODIUM (PORCINE) 5000 UNIT/ML IJ SOLN
5000.0000 [IU] | Freq: Three times a day (TID) | INTRAMUSCULAR | Status: DC
  Administered 2014-07-07 – 2014-07-10 (×11): 5000 [IU] via SUBCUTANEOUS
  Filled 2014-07-05 (×10): qty 1

## 2014-07-05 NOTE — Progress Notes (Signed)
Clinical Social Work Department CLINICAL SOCIAL WORK PLACEMENT NOTE 07/05/2014  Patient:  Dustin Freeman, Dustin Freeman  Account Number:  [id number] [place] date:  06/20/2014  Clinical Social Worker:  Maryln Manuel  Date/time:  07/05/2014 12:45 PM  Clinical Social Work is seeking post-discharge placement for this patient at the following level of care:   SKILLED NURSING   (*CSW will update this form in Epic as items are completed)   07/05/2014  Patient/family provided with [place] Department of Clinical Social Work's list of facilities offering this level of care within the geographic area requested by the patient (or if unable, by the patient's family).  07/05/2014  Patient/family informed of their freedom to choose among providers that offer the needed level of care, that participate in Medicare, Medicaid or managed care program needed by the patient, have an available bed and are willing to accept the patient.  07/05/2014  Patient/family informed of [facility]' ownership interest in [facility], as well as of the fact that they are under no obligation to receive care at this facility.  PASARR submitted to EDS on 07/05/2014 PASARR number received on 07/05/2014  FL2 transmitted to all facilities in geographic area requested by pt/family on  07/05/2014 FL2 transmitted to all facilities within larger geographic area on   Patient informed that his/her managed care company has contracts with or will negotiate with  certain facilities, including the following:     Patient/family informed of bed offers received:   Patient chooses bed at  Physician recommends and patient chooses bed at    Patient to be transferred to  on   Patient to be transferred to facility by  Patient and family notified of transfer on  Name of family member notified:    The following physician request were entered in Epic: Physician Request  Please sign FL2.    Additional Comments: Pt will be Letter  of Guarantee (LOG).   Alison Murray, MSW, [facility] [phone]

## 2014-07-05 NOTE — Progress Notes (Signed)
Clinical Social Work Department BRIEF PSYCHOSOCIAL ASSESSMENT 07/05/2014  Patient:  Dustin Freeman, Dustin Freeman     Account Number:  [id number]     Admit date:  06/20/2014  Clinical Social Worker:  Maryln Manuel  Date/Time:  07/05/2014 11:30 AM  Referred by:  Physician  Date Referred:  07/05/2014 Referred for  SNF Placement  Homelessness   Other Referral:   Interview type:  Patient Other interview type:   and patient wife at bedside.    PSYCHOSOCIAL DATA Living Status:  WIFE Admitted from facility:   Level of care:   Primary support name:  Velva Harman Erb/wife/([phone] Primary support relationship to patient:  SPOUSE Degree of support available:   adequate    CURRENT CONCERNS Current Concerns  Post-Acute Placement   Other Concerns:    SOCIAL WORK ASSESSMENT / PLAN CSW received referral for homelessness/PT recommending New SNF.    CSW met with pt at bedside along with RNCM. Pt wife not present initially during visit. CSW introduced self and explained role. Pt shared that he and his wife were homeless living in a tent prior to admission. Pt discussed that pt and pt wife received funds from retirement and pt wife moved into a motel yesterday. CSW and RNCM discussed disposition needs and discussed with pt PT recommendation for rehab at [facility]. CSW discussed with pt about the benefits of rehab at SNF especially considering pt upcoming concurrent chemotherapy and radiation treatments. Pt discussed that he feels weak and states that he does not even think that he could walk to the bathroom without assistance. CSW and RNCM discussed concern about pt level of assistance and needing to travel to and from the cancer center daily by bus. Pt agreed that it would be very difficult. CSW discussed rehab at [facility] and clarified pt questions regarding rehab. Pt very hesitant and explains that he has already been away from his wife for a long time during hospitalization and does not want to be away from his  wife longer. CSW explained that pt wife can visit as often as she'd like at the facility. Pt agreeable to allow CSW to explore options, but has not yet made decision to go to SNF.    Pt wife entered room at this time. CSW and RNCM introduced selves and updated pt wife on conversation. Pt wife expressed that she wants pt to do what is best for him and is hopeful that pt will agree to rehab at [facility]. CSW discussed that CSW will review options as pt will be Letter of Guarantee (LOG) and notify pt and pt wife what option are available in order for pt to make decision about disposition.    CSW provided pt wife with contact information for financial counselor in order for pt wife to contact financial counselor regarding [place].    CSW completed FL2 and initiated SNF search to [facility]. CSW spoke with [place] department assistant director, Nathaniel Man in order to assist with locating facility given pt no payor status.    CSW to follow up with pt and pt wife regarding offers.    CSW to continue to follow to provide support and assist with pt discharge planning needs.   Assessment/plan status:  Psychosocial Support/Ongoing Assessment of Needs Other assessment/ plan:   discharge planning   Information/referral to community resources:   [facility] search-did not provide list at this time as options will be limited secondary to no payor status.    PATIENT'S/FAMILY'S RESPONSE TO PLAN OF CARE: Pt alert  and oriented x 4. Pt recogonizes that he is very weak, but remains hesitant about rehab at [facility]. Pt wife feels that rehab at SNF will be best option and hopeful that pt will agree given their current living situation and the amount of treatment pt has ahead of him.    Alison Murray, MSW, [place] [facility] [phone]

## 2014-07-05 NOTE — Progress Notes (Addendum)
Patient ID: Dustin Freeman, male   DOB: 1955/08/21, 59 y.o.   MRN: [id number] [facility] PROGRESS NOTE  DEONDRA WIGGER VFI:[id number] DOB: November 18, 1955 DOA: 06/20/2014 PCP: No PCP Per Patient  Brief narrative:    59 y.o. male with a PMH of DM type II and PVD s/p stent placement right external iliac artery, brought to [facility] ED 06/20/14 for dizziness, cough, diarrhea. Hospitalized 3 weeks prior for syncope and underwent stent placement in occluded right external iliac artery. Ever since being discharged had diarrhea. On this admission, patient also reported weight loss of about 10 pounds and he reported history of smoking half pack per day for 48 years only recently quit. On this admission, workup revealed a large right infrahilar mass with suspicious right hilar and subcarinal adenopathy and postobstructive process, subtotal right lower lung collapse. In addition he was noted to have diffuse infiltrative process and bilateral pleural effusions and he underwent thoracentesis twice since this admission. 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 Oncology and radiation oncology on board.   Assessment/Plan:    Principal problem:  Extensive RLL, RML, and partial RUL postobustructive pneumonia / bilateral pleural effusion - Patient was started on broad-spectrum antibiotics for treatment of postobstructive pneumonia. He is currently on Zosyn only. Today he has received total of 2 week treatment with antibiotics so we will stop Zosyn today. - Patient has had thoracentesis on 06/25/2014 with 1.2 L fluid drained. Nondiagnostic cytology. - Thoracentesis repeated 06/30/2014 with 600 mL fluid removed. - Plan for chemotherapy and radiation therapy to be started during this hospital stay. Oncology and radiation oncology following.  Active Problem: Large soft tissue mass in the right hilum / Squamous cell carcinoma  - Mass  obstructs the bronchus intermedius. Patient had bronchoscopy 06/28/2014 which revealed squamous cell carcinoma. - As mentioned above, plan for chemotherapy and radiation therapy during this hospital stay. - MRI of the brain is negative for metastasis. Bone scan with no bony metastasis. - Plan for Port-A-Cath placement is on Thursday, 07/06/2014.  Fungus on BAL  - Per Dr. Bobby Rumpf (ID), given that right bronchus intermedius was totally occluded would not treat at this time. - Continue fluconazole 100 mg daily  Hypovolemic Shock / Sepsis secondary to PNA  - Resolved. - Patient is currently on Decadron 6 mg every 12 hours.  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  Mild hypokalemia - Probably from sepsis. Supplemented and potassium is now within normal limits.  COPD - Respiratory status stable.  Hyperglycemia, steroid-induced - Hemoglobin A1c 6%. - Continue sliding scale insulin. - CBGs in past 24 hours: 151, 161, 125  Diarrhea  - Resolved.   Anemia of chronic disease - Likely anemia secondary to malignancy. Hemoglobin is 11.5. - No current indications for transfusion.  PVD s/p stent placement to right external iliac artery 05/15/14 (Dr. Trula Slade) -  Stopped Plavix prior to Port-A-Cath placement  Severe protein calorie malnutrition - In the context of chronic illness, malignancy.  - Continue Marinol and nutritional supplementation. Liberalize the diet.  DVT Prophylaxis  -Heparin subQ ordered while patient in hospital.   Code Status: Full.  Family Communication:  plan of care discussed with the patient Disposition Plan: Apparently patient is homeless, we plan to start radiation treatment and chemotherapy.  IV access:  Peripheral IV  Procedures and diagnostic studies:    MRI brain 07/03/2014 - no evidence of intracranial metastasis  NM whole  body bone scan 07/03/2014 - no findings suspicious for bony metastasis  Dg Chest 1 View  06/30/2014 1. Interval reduction/near resolution of left-sided effusion post thoracentesis. No pneumothorax. 2. Interval increase in small to moderate-sized potentially partially loculated effusion with worsening right mid and lower lung heterogeneous/consolidative opacities. Electronically Signed By: Sandi Mariscal M.D. On: 06/30/2014 12:54   US Thoracentesis Asp Pleural Space W/img Guide 06/30/2014 Successful ultrasound-guided left sided thoracentesis yielding 660 cc of pleural fluid.  Ct Abdomen Pelvis W Contrast 06/29/2014 RIGHT infrahilar mass 4.3 x 4.0 cm with suspected RIGHT hilar adenopathy. Emphysematous changes with subtotal collapse of RIGHT lower lobe with slight pulmonary infiltrates question pneumonia versus lymphangitic tumor. Ascites and diffuse soft tissue edema question anasarca versus hypoproteinemia. No acute intra-abdominal or intrapelvic metastases.   Dg Chest Port 1 View 06/25/2014 Bilateral lower lobe airspace opacities and layering effusions, both worsening on the left since prior study. No pneumothorax.  Ct Chest W Contrast 3/4/2016Severe post obstructive pneumonia right lower lobe with extension of pneumonia into the right middle lobe and right upper lobe. Large soft tissue mass in the right hilum obstructs the bronchus intermedius. Malignancy suspected There are bilateral pleural effusions. There is infiltrate in the left lower lobe which, although dependent in position, is concerning for pneumonia as well. Significant atelectasis is an alternative consideration.  Dg Chest Port 1 View 3/3/2016Continue slight worsening of right basilar pneumonia, possibly postobstructive in etiology based on prior studies.  Dg Chest Port 1 View 3/3/20161. Worsening of infiltrate in the right lung base most consistent with pneumonia. Small areas a cavitation cannot be excluded within this focus of pneumonia. Consider followup chest x-ray or CT chest. 2. Hyperaeration  consistent with emphysema.  Dg Abd Acute W/chest 06/20/2014 Right lower lobe consolidation compatible with pneumonia. COPD.    Medical Consultants:  Dr. Burney Gauze, Oncology Dr. Eppie Gibson, Radiation Oncology IR Dr. Bobby Rumpf, ID  IAnti-Infectives:   Cefepime 06/20/14--> 06/21/14 Levaquin 06/20/14--->06/20/14 Vanc 06/20/14--- > 06/22/14 Zosyn 06/22/14--->   Leisa Lenz, MD  [facility] Pager [phone]  If 7PM-7AM, please contact night-coverage [email or web address] Password [facility] 07/05/2014, 11:49 AM   LOS: 16 days    HPI/Subjective: No acute overnight events.  Objective: Filed Vitals:   07/04/14 1352 07/04/14 2111 07/05/14 0445 07/05/14 0500  BP: 121/69 126/53  123/72  Pulse: 65 66  46  Temp: 97.5 F (36.4 C) 97.9 F (36.6 C)  94.2 F (34.6 C)  TempSrc: Axillary Oral  Axillary  Resp: 16 16  16   Height:      Weight:   54.885 kg (121 lb)   SpO2: 100% 98%  98%    Intake/Output Summary (Last 24 hours) at 07/05/14 1149 Last data filed at 07/05/14 1126  Gross per 24 hour  Intake    840 ml  Output   1675 ml  Net   -835 ml    Exam:   General:  Pt is not in distress  Cardiovascular: Regular rhythm, appreciate S1, S2  Respiratory: No wheezing, no crackles  Abdomen: Nontender abdomen, nondistended, appreciate bowel sounds  Extremities: No lower extremity swelling, pulses palpable  Neuro: Nonfocal  Data Reviewed: Basic Metabolic Panel:  Recent Labs Lab 06/29/14 0232 06/30/14 0254 07/01/14 0402 07/01/14 1657 07/02/14 0328 07/04/14 0435  NA 137 136 136  --   --  133*  K 3.5 2.7* 2.6* 3.4*  --  3.8  CL 104 101 101  --   --  99  CO2 27 27 28   --   --  27  GLUCOSE 123* 114* 116*  --   --  143*  BUN 11 9 12   --   --  18  CREATININE 0.53 0.51 0.52  --   --  0.58  CALCIUM 7.8* 7.9* 7.8*  --   --  8.2*  MG 2.0 2.0 2.0 2.0 1.9  --    Liver Function Tests:  Recent Labs Lab 06/29/14 0232 06/30/14 0254 07/01/14 0402 07/04/14 0435  AST 42*  39* 31 19  ALT 37 42 43 30  ALKPHOS 43 47 51 63  BILITOT 0.4 0.5 0.4 0.5  PROT 4.6* 4.7* 4.7* 5.5*  ALBUMIN 1.9* 1.9* 1.9* 2.5*   No results for input(s): LIPASE, AMYLASE in the last 168 hours. No results for input(s): AMMONIA in the last 168 hours. CBC:  Recent Labs Lab 06/29/14 0232 06/30/14 0254 07/01/14 0402 07/02/14 0328 07/04/14 0435  WBC 6.7 7.9 9.8 10.5 9.5  NEUTROABS 5.5 6.4 7.6 8.1*  --   HGB 9.4* 9.9* 9.9* 10.0* 11.5*  HCT 28.3* 28.9* 28.9* 29.6* 33.2*  MCV 86.3 85.8 86.5 88.9 90.0  PLT 361 400 398 370 378   Cardiac Enzymes: No results for input(s): CKTOTAL, CKMB, CKMBINDEX, TROPONINI in the last 168 hours. BNP: Invalid input(s): POCBNP CBG:  Recent Labs Lab 07/04/14 0732 07/04/14 1140 07/04/14 1713 07/04/14 2109 07/05/14 0723  GLUCAP 132* 181* 151* 161* 125*    Recent Results (from the past 240 hour(s))  Body fluid culture     Status: None   Collection Time: 06/25/14  3:15 PM  Result Value Ref Range Status   Specimen Description FLUID RIGHT PLEURAL  Final   Special Requests Normal  Final   Gram Stain   Final    FEW WBC PRESENT, PREDOMINANTLY PMN NO ORGANISMS SEEN Performed at [facility]    Culture   Final    NO GROWTH 3 DAYS Performed at [facility]    Report Status 06/29/2014 FINAL  Final  AFB culture with smear     Status: None (Preliminary result)   Collection Time: 06/25/14  3:15 PM  Result Value Ref Range Status   Specimen Description FLUID RIGHT PLEURAL  Final   Special Requests Normal  Final   Acid Fast Smear   Final    NO ACID FAST BACILLI SEEN Performed at [facility]    Culture   Final    CULTURE WILL BE EXAMINED FOR 6 WEEKS BEFORE ISSUING A FINAL REPORT Performed at [facility]    Report Status PENDING  Incomplete  Fungal stain     Status: None   Collection Time: 06/25/14  3:15 PM  Result Value Ref Range Status   Specimen Description FLUID RIGHT PLEURAL  Final   Special Requests  Normal  Final   Fungal Smear   Final    NO YEAST OR FUNGAL ELEMENTS SEEN Performed at [facility]    Report Status 06/26/2014 FINAL  Final  AFB culture with smear     Status: None (Preliminary result)   Collection Time: 06/28/14 11:19 AM  Result Value Ref Range Status   Specimen Description BRONCHIAL ALVEOLAR LAVAGE  Final   Special Requests Normal  Final   Acid Fast Smear   Final    NO ACID FAST BACILLI SEEN Performed at [facility]    Culture   Final    CULTURE WILL BE EXAMINED FOR 6 WEEKS BEFORE ISSUING A FINAL REPORT Performed at [facility]  Report Status PENDING  Incomplete  Fungus Culture with Smear     Status: None (Preliminary result)   Collection Time: 06/28/14 11:19 AM  Result Value Ref Range Status   Specimen Description BRONCHIAL ALVEOLAR LAVAGE  Final   Special Requests Normal  Final   Fungal Smear   Final    NO YEAST OR FUNGAL ELEMENTS SEEN Performed at [facility]    Culture   Final    CANDIDA ALBICANS Performed at [facility]    Report Status PENDING  Incomplete  Culture, bal-quantitative     Status: None   Collection Time: 06/28/14 11:19 AM  Result Value Ref Range Status   Specimen Description BRONCHIAL ALVEOLAR LAVAGE  Final   Special Requests NONE  Final   Gram Stain   Final    FEW WBC PRESENT,BOTH PMN AND MONONUCLEAR RARE SQUAMOUS EPITHELIAL CELLS PRESENT NO ORGANISMS SEEN Performed at [facility]   Final    25,000 COLONIES/ML Performed at [facility]    Culture   Final    CANDIDA ALBICANS Performed at [facility]    Report Status 06/30/2014 FINAL  Final     Scheduled Meds: . antiseptic oral rinse  7 mL Mouth Rinse q12n4p  . chlorhexidine  15 mL Mouth Rinse BID  . dexamethasone  6 mg Oral Q12H  . dronabinol  5 mg Oral BID AC  . feeding supplement (ENSURE COMPLETE)  237 mL Oral TID BM  . fluconazole  100 mg Oral Daily  . heparin  5,000 Units  Subcutaneous 3 times per day  . [START ON 07/07/2014] heparin  5,000 Units Subcutaneous 3 times per day  . insulin aspart  0-15 Units Subcutaneous TID WC  . multivitamin with minerals  1 tablet Oral Daily  . piperacillin-tazobactam (ZOSYN)  IV  3.375 g Intravenous Q8H  . polyethylene glycol  17 g Oral Daily   Continuous Infusions:

## 2014-07-05 NOTE — Care Management Note (Signed)
CARE MANAGEMENT NOTE 07/05/2014  Patient:  MICIAH, COVELLI   Account Number:  [id number]  Date Initiated:  06/21/2014  Documentation initiated by:  [facility]  Subjective/Objective Assessment:   Admitted with suspect pnuemonia - hypotension - fluid bolus's     Action/Plan:   homeless   Anticipated DC Date:  07/06/2014   Anticipated DC Plan:  HOMELESS SHELTER  In-house referral  Clinical Social Worker      [place]  CM consult      Choice offered to / List presented to:             Status of service:  In process, will continue to follow Medicare Important Message given?  NO (If response is "NO", the following Medicare IM given date fields will be blank) Date Medicare IM given:   Medicare IM given by:   Date Additional Medicare IM given:   Additional Medicare IM given by:    Discharge Disposition:    Per UR Regulation:  Reviewed for med. necessity/level of care/duration of stay  If discussed at [place] of Stay Meetings, dates discussed:   06/26/2014  06/28/2014  07/03/2014  07/05/2014    Comments:  Contact:  Nicolls,Rita Spouse [phone]  07/05/14 Marney Doctor RN,BSN,NCM This CM and CSW met with pt and wife about disposition plan. Please see CSW note. Pt now agreeable to short term SNF to gain strengh. Pt also given [place] packet and explained resources there. CM will continue to follow  07/04/14 Marney Doctor RN,BSN,NCM [phone] Pt transferred to [place] from [facility] to start chemotherapy. PT to eval pt.  CM will follow and assist with DC needs.  06/25/2014 @ 11:00  Whitman Hero RN,BSN,CM [phone] Pt  is homeless, per CSW  pt states has relationship with [employer].  CSW will call [facility] regarding assist with medications. Medicare should go into effect on 07/04/2014 per pt. Will continue to monitor for discharge needs.  06-21-14 10:35am Luz Lex, RNBSN [phone] Notes say patient homeless. - showing he has a wife?? Talked with patient.  He has been married for  37 years. Both he and his wife have been living in a hotel, but today they were to move into a house.  Have a county SW assisting them.  Wife should be in house today.  Will notify SW to follow.  CM will continue to follow also for any dc needs.

## 2014-07-05 NOTE — Progress Notes (Signed)
I'm still waiting for his Port-A-Cath to be put in. It will be put in tomorrow.  He actually feels pretty good. He actually wants to go home. He seems to be eating  He is not having any obvious pain.he's had no dyspnea. I think the Marinol seems to be helping him.  Radiation oncology has seen him.  He is not feeling as dizzy. I am not sure as to why he had this episode yesterday.  I think that he has some type of event that he and his wife want to attend. This is important to him. As such, I think that this would be okay for him to the discharged tomorrow after the Port-A-Cath is put in and then we can treat him as an outpatient.  There is no lab work on him today. I will make sure he has some done tomorrow.  I still think that he would be a decent candidate for systemic therapy. I would use cisplatin/etoposide. I think this is definitely a active regimen with radiation.  On his physical exam, his vital signs are stable.there is no change in his physical exam.  We will see how he looks tomorrow. Again, he can be discharged from the inpatient unit. If he wants to go home tomorrow after his Port-A-Cath is placed, this would be okay by me.  I appreciate the great care that he is gotten from everybody on the floor.  Laurey Arrow

## 2014-07-06 ENCOUNTER — Inpatient Hospital Stay (HOSPITAL_COMMUNITY): Payer: Medicaid Other

## 2014-07-06 DIAGNOSIS — R5383 Other fatigue: Secondary | ICD-10-CM

## 2014-07-06 DIAGNOSIS — R001 Bradycardia, unspecified: Secondary | ICD-10-CM

## 2014-07-06 LAB — GLUCOSE, CAPILLARY
GLUCOSE-CAPILLARY: 111 mg/dL — AB (ref 70–99)
Glucose-Capillary: 183 mg/dL — ABNORMAL HIGH (ref 70–99)
Glucose-Capillary: 129 mg/dL — ABNORMAL HIGH (ref 70–99)
Glucose-Capillary: 125 mg/dL — ABNORMAL HIGH (ref 70–99)
Glucose-Capillary: 121 mg/dL — ABNORMAL HIGH (ref 70–99)

## 2014-07-06 MED ORDER — ALTEPLASE 2 MG IJ SOLR
2.0000 mg | Freq: Once | INTRAMUSCULAR | Status: DC | PRN
  Filled 2014-07-06: qty 2

## 2014-07-06 MED ORDER — FENTANYL CITRATE 0.05 MG/ML IJ SOLN
INTRAMUSCULAR | Status: AC | PRN
  Administered 2014-07-06: 25 ug via INTRAVENOUS

## 2014-07-06 MED ORDER — SODIUM CHLORIDE 0.9 % IJ SOLN: 10.0000 mL | INTRAMUSCULAR | Status: DC | PRN

## 2014-07-06 MED ORDER — CEFAZOLIN SODIUM-DEXTROSE 2-3 GM-% IV SOLR
2.0000 g | Freq: Once | INTRAVENOUS | Status: AC
  Administered 2014-07-06: 2 g via INTRAVENOUS

## 2014-07-06 MED ORDER — FENTANYL CITRATE 0.05 MG/ML IJ SOLN
INTRAMUSCULAR | Status: AC
  Filled 2014-07-06: qty 4

## 2014-07-06 MED ORDER — POTASSIUM CHLORIDE 2 MEQ/ML IV SOLN
Freq: Once | INTRAVENOUS | Status: DC
  Filled 2014-07-06: qty 10

## 2014-07-06 MED ORDER — DEXAMETHASONE 4 MG PO TABS: ORAL_TABLET | ORAL | Status: DC

## 2014-07-06 MED ORDER — SODIUM CHLORIDE 0.9 % IV SOLN
Freq: Once | INTRAVENOUS | Status: DC
  Filled 2014-07-06: qty 5

## 2014-07-06 MED ORDER — SODIUM CHLORIDE 0.9 % IV SOLN
Freq: Once | INTRAVENOUS | Status: AC
  Administered 2014-07-06: 13:00:00 via INTRAVENOUS

## 2014-07-06 MED ORDER — MIDAZOLAM HCL 2 MG/2ML IJ SOLN
INTRAMUSCULAR | Status: AC | PRN
  Administered 2014-07-06: 0.5 mg via INTRAVENOUS

## 2014-07-06 MED ORDER — SODIUM CHLORIDE 0.9 % IJ SOLN: 3.0000 mL | INTRAMUSCULAR | Status: DC | PRN

## 2014-07-06 MED ORDER — PROCHLORPERAZINE MALEATE 10 MG PO TABS: 10.0000 mg | ORAL_TABLET | Freq: Four times a day (QID) | ORAL | Status: DC | PRN

## 2014-07-06 MED ORDER — HEPARIN SOD (PORK) LOCK FLUSH 100 UNIT/ML IV SOLN: 500.0000 [IU] | Freq: Once | INTRAVENOUS | Status: DC | PRN

## 2014-07-06 MED ORDER — HEPARIN SOD (PORK) LOCK FLUSH 100 UNIT/ML IV SOLN: 250.0000 [IU] | Freq: Once | INTRAVENOUS | Status: DC | PRN

## 2014-07-06 MED ORDER — CEFAZOLIN SODIUM-DEXTROSE 2-3 GM-% IV SOLR
INTRAVENOUS | Status: AC
  Filled 2014-07-06: qty 50

## 2014-07-06 MED ORDER — ONDANSETRON HCL 8 MG PO TABS: 8.0000 mg | ORAL_TABLET | Freq: Two times a day (BID) | ORAL | Status: DC | PRN

## 2014-07-06 MED ORDER — SODIUM CHLORIDE 0.9 % IV SOLN
40.0000 mg/m2 | Freq: Once | INTRAVENOUS | Status: DC
  Filled 2014-07-06: qty 65

## 2014-07-06 MED ORDER — LORAZEPAM 0.5 MG PO TABS: 0.5000 mg | ORAL_TABLET | Freq: Four times a day (QID) | ORAL | Status: DC | PRN

## 2014-07-06 MED ORDER — LIDOCAINE-PRILOCAINE 2.5-2.5 % EX CREA: TOPICAL_CREAM | CUTANEOUS | Status: DC

## 2014-07-06 MED ORDER — HOT PACK MISC ONCOLOGY
1.0000 | Freq: Once | Status: DC | PRN
  Filled 2014-07-06: qty 1

## 2014-07-06 MED ORDER — PALONOSETRON HCL INJECTION 0.25 MG/5ML
0.2500 mg | Freq: Once | INTRAVENOUS | Status: DC
  Filled 2014-07-06: qty 5

## 2014-07-06 MED ORDER — MIDAZOLAM HCL 2 MG/2ML IJ SOLN
INTRAMUSCULAR | Status: AC
  Filled 2014-07-06: qty 6

## 2014-07-06 MED ORDER — SODIUM CHLORIDE 0.9 % IV SOLN
40.0000 mg/m2 | Freq: Once | INTRAVENOUS | Status: DC
  Filled 2014-07-06: qty 3.5

## 2014-07-06 MED ORDER — LIDOCAINE-EPINEPHRINE 2 %-1:100000 IJ SOLN
INTRAMUSCULAR | Status: AC
Start: 2014-07-06 — End: 2014-07-06
  Filled 2014-07-06: qty 1

## 2014-07-06 NOTE — Progress Notes (Signed)
Received from 3W patient alert and oriented, agree ith previous RN's assessment. NSR -HR 70's.

## 2014-07-06 NOTE — Progress Notes (Addendum)
Patient ID: Dustin Freeman, male   DOB: 02-Oct-1955, 59 y.o.   MRN: [id number] [facility] PROGRESS NOTE  Dustin Freeman GEX:[id number] DOB: [birth year] DOA: 06/20/2014 PCP: No PCP Per Patient  Brief narrative:    59 y.o. male with a PMH of DM type II and PVD s/p stent placement right external iliac artery, brought to [facility] ED 06/20/14 for dizziness, cough, diarrhea. Hospitalized 3 weeks prior for syncope and underwent stent placement in occluded right external iliac artery. Ever since being discharged had diarrhea. On this admission, patient also reported weight loss of about 10 pounds and he reported history of smoking half pack per day for 48 years only recently quit. On this admission, workup revealed a large right infrahilar mass with suspicious right hilar and subcarinal adenopathy and postobstructive process, subtotal right lower lung collapse. In addition he was noted to have diffuse infiltrative process and bilateral pleural effusions and he underwent thoracentesis twice since this admission. 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 Oncology and radiation oncology on board. PAC placed 3/18 and chemo to be started today.   Assessment/Plan:    Principal problem:  Extensive RLL, RML, and partial RUL postobustructive pneumonia / bilateral pleural effusion - Patient was started on broad-spectrum antibiotics for treatment of postobstructive pneumonia. He was lastly on zosyn for 2 weeks, stopped 07/05/2014. - Patient s/p thoracentesis on 06/25/2014 with 1.2 L fluid drained. Nondiagnostic cytology.  - Thoracentesis repeated 06/30/2014 with 600 mL fluid removed. - Stable respiratory status.  Active Problem: Large soft tissue mass in the right hilum / Squamous cell carcinoma  - Mass apparently obstructs the bronchus intermedius. Patient had bronchoscopy 06/28/2014 which revealed squamous cell carcinoma. - PAC  placed 3/18 and chemo will be started today. - MRI of the brain is negative for metastasis. Bone scan with no bony metastasis.  Fungus on BAL  - Per Dr. Bobby Rumpf (ID), given that right bronchus intermedius was totally occluded would not treat at this time. - we are however giving fluconazole 100 mg daily  Bradycardia - Unclear etiology - Obtain 12 lead EKG - Transfer to telemetry  Hypovolemic Shock / Sepsis secondary to PNA  - Resolved. - Patient is currently on Decadron 6 mg every 12 hours.  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  Mild hypokalemia - Probably from sepsis. Supplemented and potassium is now within normal limits.  COPD - Respiratory status stable.  Hyperglycemia, steroid-induced - Hemoglobin A1c 6%. - Continue sliding scale insulin. - CBGs in past 24 hours: 151, 161, 125  Diarrhea  - Resolved.   Anemia of chronic disease - Likely anemia secondary to malignancy. Hemoglobin is 11.5. - No current indications for transfusion.  PVD s/p stent placement to right external iliac artery 05/15/14 (Dr. Trula Slade) -  Stopped Plavix prior to Port-A-Cath placement. We can resume it now that [facility] placed.  Severe protein calorie malnutrition - In the context of chronic illness, malignancy.  - Will continue Marinol and nutritional supplementation.   DVT Prophylaxis  -Heparin subQ ordered while patient in hospital.   Code Status: Full.  Family Communication:  plan of care discussed with the patient Disposition Plan: has bradycardia, transfer to telemetry. Order placed.  IV access:  [facility] 07/06/2014  Procedures and diagnostic studies:    MRI brain 07/03/2014 - no evidence of intracranial metastasis  NM whole body bone scan 07/03/2014 - no findings suspicious for bony metastasis  Dg Chest 1 View 06/30/2014 1. Interval reduction/near resolution of left-sided effusion post thoracentesis. No pneumothorax. 2. Interval increase in small  to moderate-sized potentially partially loculated effusion with worsening right mid and lower lung heterogeneous/consolidative opacities. Electronically Signed By: Sandi Mariscal M.D. On: 06/30/2014 12:54   US Thoracentesis Asp Pleural Space W/img Guide 06/30/2014 Successful ultrasound-guided left sided thoracentesis yielding 660 cc of pleural fluid.  Ct Abdomen Pelvis W Contrast 06/29/2014 RIGHT infrahilar mass 4.3 x 4.0 cm with suspected RIGHT hilar adenopathy. Emphysematous changes with subtotal collapse of RIGHT lower lobe with slight pulmonary infiltrates question pneumonia versus lymphangitic tumor. Ascites and diffuse soft tissue edema question anasarca versus hypoproteinemia. No acute intra-abdominal or intrapelvic metastases.   Dg Chest Port 1 View 06/25/2014 Bilateral lower lobe airspace opacities and layering effusions, both worsening on the left since prior study. No pneumothorax.  Ct Chest W Contrast 3/4/2016Severe post obstructive pneumonia right lower lobe with extension of pneumonia into the right middle lobe and right upper lobe. Large soft tissue mass in the right hilum obstructs the bronchus intermedius. Malignancy suspected There are bilateral pleural effusions. There is infiltrate in the left lower lobe which, although dependent in position, is concerning for pneumonia as well. Significant atelectasis is an alternative consideration.  Dg Chest Port 1 View 3/3/2016Continue slight worsening of right basilar pneumonia, possibly postobstructive in etiology based on prior studies.  Dg Chest Port 1 View 3/3/20161. Worsening of infiltrate in the right lung base most consistent with pneumonia. Small areas a cavitation cannot be excluded within this focus of pneumonia. Consider followup chest x-ray or CT chest. 2. Hyperaeration consistent with emphysema.  Dg Abd Acute W/chest 06/20/2014 Right lower lobe consolidation compatible with pneumonia. COPD.    Medical  Consultants:  Dr. Burney Gauze, Oncology Dr. Eppie Gibson, Radiation Oncology IR Dr. Bobby Rumpf, ID  IAnti-Infectives:   Cefepime 06/20/14--> 06/21/14 Levaquin 06/20/14--->06/20/14 Vanc 06/20/14--- > 06/22/14 Zosyn 06/22/14---> 07/05/2014   Leisa Lenz, MD  [facility] Pager [phone]  If 7PM-7AM, please contact night-coverage [email or web address] Password [facility] 07/06/2014, 12:46 PM   LOS: 17 days    HPI/Subjective: No acute overnight events.  Objective: Filed Vitals:   07/06/14 1025 07/06/14 1027 07/06/14 1045 07/06/14 1100  BP: 115/67 112/69 124/72 114/60  Pulse: 53 49 56 50  Temp:   97.3 F (36.3 C)   TempSrc:   Axillary   Resp: 13 12 20    Height:      Weight:      SpO2: 99% 99% 98% 97%    Intake/Output Summary (Last 24 hours) at 07/06/14 1246 Last data filed at 07/05/14 2230  Gross per 24 hour  Intake    580 ml  Output    625 ml  Net    -45 ml    Exam:   General:  Pt is sleeping this am, not in distress  Cardiovascular: bradycardic, S1, S2 (+)  Respiratory: bilateral air entry, no wheezing  Abdomen: no tenderness, no distention, (+) BS  Extremities: No edema, pulses palpable   Neuro: No focal deficits   Data Reviewed: Basic Metabolic Panel:  Recent Labs Lab 06/30/14 0254 07/01/14 0402 07/01/14 1657 07/02/14 0328 07/04/14 0435  NA 136 136  --   --  133*  K 2.7* 2.6* 3.4*  --  3.8  CL 101 101  --   --  99  CO2 27 28  --   --  27  GLUCOSE 114* 116*  --   --  143*  BUN 9 12  --   --  18  CREATININE 0.51 0.52  --   --  0.58  CALCIUM 7.9* 7.8*  --   --  8.2*  MG 2.0 2.0 2.0 1.9  --    Liver Function Tests:  Recent Labs Lab 06/30/14 0254 07/01/14 0402 07/04/14 0435  AST 39* 31 19  ALT 42 43 30  ALKPHOS 47 51 63  BILITOT 0.5 0.4 0.5  PROT 4.7* 4.7* 5.5*  ALBUMIN 1.9* 1.9* 2.5*   No results for input(s): LIPASE, AMYLASE in the last 168 hours. No results for input(s): AMMONIA in the last 168 hours. CBC:  Recent Labs Lab  06/30/14 0254 07/01/14 0402 07/02/14 0328 07/04/14 0435  WBC 7.9 9.8 10.5 9.5  NEUTROABS 6.4 7.6 8.1*  --   HGB 9.9* 9.9* 10.0* 11.5*  HCT 28.9* 28.9* 29.6* 33.2*  MCV 85.8 86.5 88.9 90.0  PLT 400 398 370 378   Cardiac Enzymes: No results for input(s): CKTOTAL, CKMB, CKMBINDEX, TROPONINI in the last 168 hours. BNP: Invalid input(s): POCBNP CBG:  Recent Labs Lab 07/05/14 1200 07/05/14 1727 07/05/14 2236 07/06/14 0741 07/06/14 1128  GLUCAP 179* 152* 183* 111* 129*    Recent Results (from the past 240 hour(s))  AFB culture with smear     Status: None (Preliminary result)   Collection Time: 06/28/14 11:19 AM  Result Value Ref Range Status   Specimen Description BRONCHIAL ALVEOLAR LAVAGE  Final   Special Requests Normal  Final   Acid Fast Smear   Final    NO ACID FAST BACILLI SEEN Performed at [facility]    Culture   Final    CULTURE WILL BE EXAMINED FOR 6 WEEKS BEFORE ISSUING A FINAL REPORT Performed at [facility]    Report Status PENDING  Incomplete  Fungus Culture with Smear     Status: None (Preliminary result)   Collection Time: 06/28/14 11:19 AM  Result Value Ref Range Status   Specimen Description BRONCHIAL ALVEOLAR LAVAGE  Final   Special Requests Normal  Final   Fungal Smear   Final    NO YEAST OR FUNGAL ELEMENTS SEEN Performed at [facility]    Culture   Final    CANDIDA ALBICANS Performed at [facility]    Report Status PENDING  Incomplete  Culture, bal-quantitative     Status: None   Collection Time: 06/28/14 11:19 AM  Result Value Ref Range Status   Specimen Description BRONCHIAL ALVEOLAR LAVAGE  Final   Special Requests NONE  Final   Gram Stain   Final    FEW WBC PRESENT,BOTH PMN AND MONONUCLEAR RARE SQUAMOUS EPITHELIAL CELLS PRESENT NO ORGANISMS SEEN Performed at [facility]   Final    25,000 COLONIES/ML Performed at [facility]    Culture   Final    CANDIDA  ALBICANS Performed at [facility]    Report Status 06/30/2014 FINAL  Final     Scheduled Meds: . antiseptic oral rinse  7 mL Mouth Rinse q12n4p  . chlorhexidine  15 mL Mouth Rinse BID  . CISplatin  40 mg/m2 (Treatment Plan Actual) Intravenous Once  . dexamethasone  6 mg Oral Q12H  . D5-1/2NS + KCL + MG +/- MANNITOL IV CISplatin hydration   Intravenous Once  . dronabinol  5 mg Oral BID AC  . etoposide  40 mg/m2 (Treatment Plan Actual) Intravenous Once  . feeding supplement (ENSURE COMPLETE)  237 mL Oral TID BM  . fluconazole  100  mg Oral Daily  . fosaprepitant (EMEND) 150 mg + dexamethasone IV infusion   Intravenous Once  . [START ON 07/07/2014] heparin  5,000 Units Subcutaneous 3 times per day  . insulin aspart  0-15 Units Subcutaneous TID WC  . lidocaine-EPINEPHrine      . multivitamin with minerals  1 tablet Oral Daily  . palonosetron  0.25 mg Intravenous Once  . polyethylene glycol  17 g Oral Daily   Continuous Infusions:

## 2014-07-06 NOTE — Progress Notes (Signed)
Chemo therapy teaching done with patient. Patient denied questions at this time.

## 2014-07-06 NOTE — Progress Notes (Signed)
Dustin Freeman seems very fatigued today. He is somewhat lethargic. He does arouse easily to voice.  He denies any pain. He says that he ate pretty well yesterday.  He's had no dyspnea. He's had no diarrhea.  His heart rate is on the low side. I don't know if this might be causing some of the lethargy.  Again, he is to have his Port-A-Cath put in today.     On his physical exam, his temperature is 97.  Pulse is 49. Blood pressure 120/69. No change is noted with his physical exam.his lungs sound clearer on the left side. He has some slight decrease on the right side. Cardiac exam is slow but regular.abdomen is soft. Has good bowel sounds. There is no guarding or rebound tenderness. He has no palpable hepatosplenomegaly.  I really do not think that he will be able to go home over the weekend.  I will start chemo once the port is placed.follow.  I'm not sure why his heart rate is so slow. I don't know if he has to go on a Holter monitor. He probably needs to have an EKG done.  All along, I knew that this was to be a very difficult situation. I hope that he will be oh to tolerate treatment. I think that he can. He really needs to be able to eat well.  I am praying for him.  Pete E.  Romans 8:14

## 2014-07-06 NOTE — Progress Notes (Signed)
Patient pulled out port-a cath. IV team notified

## 2014-07-06 NOTE — Progress Notes (Signed)
CSW continuing to follow.   CSW reviewed chart and noted that pt had port a cath placement this morning and oncologist plans to start chemotherapy today.   CSW received phone call from pt wife who stated that she was going to [facility] today to meet with a financial counselor regarding applying for Medicaid for pt. Pt had a question regarding if pt has applied for disability and CSW discussed with pt that it will be best to talk to the financial counselor that she meets with today or contact the financial counselor, Lura Em to clarify. Pt wife expressed understanding.   CSW updated pt wife regarding disposition planning and discussed with pt wife that pt will remain in the hospital over the weekend. CSW discussed with pt wife that CSW did not yet have any options to provide regarding SNF bed offer, but CSW is working diligently with CSW Surveyor, quantity, Nathaniel Man to locate facility that can meet pt needs. Pt wife expressed understanding and appreciative of CSW assistance with planning.  CSW updated CSW Surveyor, quantity, Nathaniel Man regarding that pt will be starting chemotherapy in the hospital. Per CSW assistance director, Nathaniel Man he is in conversation with facilities in regard to pt placement and will update this CSW regarding progress.  CSW to continue to follow to provide support and assist with pt disposition needs.   Alison Murray, MSW, [employer] [phone]

## 2014-07-06 NOTE — Progress Notes (Signed)
Received a call from [place] at 1400. Per her discussion with Dr. Marin Olp, chemotherapy is postponed for now d/t ongoing bradycardia.  Romeo Rabon, PharmD, pager [phone]. 07/06/2014,2:32 PM.

## 2014-07-06 NOTE — Progress Notes (Signed)
Order placed to transfer to telemetry due to bradycardia. Alma devine trh [phone]

## 2014-07-06 NOTE — Progress Notes (Signed)
Pt transferred to room 1424 via bed.Marland Kitchen

## 2014-07-06 NOTE — Procedures (Signed)
Successful placement of left IJ approach port-a-cath with tip at the superior caval atrial junction. The catheter is ready for immediate use. No immediate post procedural complications.

## 2014-07-06 NOTE — Progress Notes (Signed)
NUTRITION FOLLOW-UP  INTERVENTION: -Diet advancement per MD -Continue Ensure Enlive PO TID when diet advanced, each supplement provides 350 kcal and 20 grams of protein -RD to continue to monitor  NUTRITION DIAGNOSIS: Inadequate oral intake related to poor appetite as evidenced by decreased PO intake; ongoing  Goal: Pt to meet >/= 90% of estimated energy needs; progressing.   Monitor:  Diet advancement, PO intake, weight status, labs  ASSESSMENT: 59 y/o male with past medical history of DMII and PVD s/p stent placement of right external iliac artery. Presented to [facility] ED with dizziness, cough, diarrhea. Hospitalized 3 weeks ago for syncope and underwent stent placement in occluded right external iliac artery at this time. Ever since discharge, has had diarrhea.   3/11: Pt meets criteria for severe malnutrition in the context of chronic illness as evidenced by intake < 75% of estimated energy expenditure for > 1 month, and severe muscle mass and body fat depletion.  Pt transferred to [facility] 3/14.  PO intake improved: 100%. Pt is drinking his Ensure supplements.  Pt's weight is -16 lb since 3/11.  Pt is having port-a-cath placed today. Pt currently NPO. Per MD note, pt will begin chemo once port placed.  Labs reviewed:  Low Na  Height: Ht Readings from Last 1 Encounters:  07/02/14 5\' 9"  (1.753 m)    Weight: Wt Readings from Last 1 Encounters:  07/06/14 119 lb 9.6 oz (54.25 kg)  06/29/14 135 lb  06/22/14 130 lb 8.2 oz (59.2 kg)   06/21/14 122 lb 12.7 oz (55.7 kg)            BMI:  Body mass index is 17.65 kg/(m^2).   Estimated Nutritional Needs: Kcal: 1750-1950 kcal Protein: 80-95 grams Fluid: >/= 1.5L daily  Skin: intact  Diet Order: Diet NPO time specified Except for: Sips with Meds   Intake/Output Summary (Last 24 hours) at 07/06/14 0947 Last data filed at 07/05/14 2230  Gross per 24 hour  Intake    580 ml  Output   1075 ml  Net   -495 ml    Last  BM: 3/13  Labs:   Recent Labs Lab 06/30/14 0254 07/01/14 0402 07/01/14 1657 07/02/14 0328 07/04/14 0435  NA 136 136  --   --  133*  K 2.7* 2.6* 3.4*  --  3.8  CL 101 101  --   --  99  CO2 27 28  --   --  27  BUN 9 12  --   --  18  CREATININE 0.51 0.52  --   --  0.58  CALCIUM 7.9* 7.8*  --   --  8.2*  MG 2.0 2.0 2.0 1.9  --   GLUCOSE 114* 116*  --   --  143*    CBG (last 3)   Recent Labs  07/05/14 1727 07/05/14 2236 07/06/14 0741  GLUCAP 152* 183* 111*    Scheduled Meds: . antiseptic oral rinse  7 mL Mouth Rinse q12n4p  . ceFAZolin      .  ceFAZolin (ANCEF) IV  2 g Intravenous Once  . chlorhexidine  15 mL Mouth Rinse BID  . dexamethasone  6 mg Oral Q12H  . dronabinol  5 mg Oral BID AC  . feeding supplement (ENSURE COMPLETE)  237 mL Oral TID BM  . fentaNYL      . fluconazole  100 mg Oral Daily  . [START ON 07/07/2014] heparin  5,000 Units Subcutaneous 3 times per day  . insulin aspart  0-15 Units Subcutaneous TID WC  . lidocaine-EPINEPHrine      . midazolam      . multivitamin with minerals  1 tablet Oral Daily  . polyethylene glycol  17 g Oral Daily    Continuous Infusions:     Clayton Bibles, MS, RD, LDN Pager: [phone] After Hours Pager: [phone]

## 2014-07-06 NOTE — Progress Notes (Signed)
PT Cancellation Note  Patient Details Name: Dustin Freeman MRN: [id number] DOB: 1955/12/29   Cancelled Treatment:     Glori Luis a Cath placement this am.  Can check back this afternoon if schedule permits.     Nathanial Rancher 07/06/2014, 11:41 AM

## 2014-07-07 DIAGNOSIS — C3400 Malignant neoplasm of unspecified main bronchus: Secondary | ICD-10-CM

## 2014-07-07 LAB — GLUCOSE, CAPILLARY
GLUCOSE-CAPILLARY: 98 mg/dL (ref 70–99)
GLUCOSE-CAPILLARY: 89 mg/dL (ref 70–99)
Glucose-Capillary: 129 mg/dL — ABNORMAL HIGH (ref 70–99)
Glucose-Capillary: 190 mg/dL — ABNORMAL HIGH (ref 70–99)
Glucose-Capillary: 119 mg/dL — ABNORMAL HIGH (ref 70–99)

## 2014-07-07 MED ORDER — SODIUM CHLORIDE 0.9 % IJ SOLN
10.0000 mL | Freq: Two times a day (BID) | INTRAMUSCULAR | Status: DC
  Administered 2014-07-07 – 2014-07-09 (×3): 10 mL

## 2014-07-07 MED ORDER — CLOPIDOGREL BISULFATE 75 MG PO TABS
75.0000 mg | ORAL_TABLET | Freq: Every day | ORAL | Status: DC
  Administered 2014-07-07 – 2014-07-10 (×4): 75 mg via ORAL
  Filled 2014-07-07 (×4): qty 1

## 2014-07-07 MED ORDER — SODIUM CHLORIDE 0.9 % IJ SOLN
10.0000 mL | INTRAMUSCULAR | Status: DC | PRN
  Administered 2014-07-09 – 2014-07-10 (×3): 10 mL
  Filled 2014-07-07 (×3): qty 40

## 2014-07-07 MED ORDER — PREGABALIN 75 MG PO CAPS
75.0000 mg | ORAL_CAPSULE | Freq: Two times a day (BID) | ORAL | Status: DC
  Administered 2014-07-07 – 2014-07-10 (×6): 75 mg via ORAL
  Filled 2014-07-07 (×6): qty 1

## 2014-07-07 NOTE — Progress Notes (Signed)
Patient ID: Dustin Freeman, male   DOB: 06/30/1955, 59 y.o.   MRN: [id number] [facility] PROGRESS NOTE  Dustin Freeman TDD:[id number] DOB: Oct 10, 1955 DOA: 06/20/2014 PCP: No PCP Per Patient  Brief narrative:    60 y.o. male with a PMH of DM type II and PVD s/p stent placement right external iliac artery, brought to [facility] ED 06/20/14 for dizziness, cough, diarrhea. Hospitalized 3 weeks prior for syncope and underwent stent placement in occluded right external iliac artery. Ever since being discharged had diarrhea. On this admission, patient also reported weight loss of about 10 pounds and he reported history of smoking half pack per day for 48 years only recently quit. On this admission, workup revealed a large right infrahilar mass with suspicious right hilar and subcarinal adenopathy and postobstructive process, subtotal right lower lung collapse. In addition he was noted to have diffuse infiltrative process and bilateral pleural effusions and he underwent thoracentesis twice since this admission. 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 Oncology and radiation oncology on board. PAC placed 3/18 and chemo started 07/06/2014 (carboplatin and etoposide).   Assessment/Plan:    Principal problem:  Extensive RLL, RML, and partial RUL postobustructive pneumonia / bilateral pleural effusion - Patient was started on broad-spectrum antibiotics for treatment of postobstructive pneumonia.  - Please see section on antiinfectives about the duration of all antibiotics - Patient is s/p thoracentesis on 06/25/2014 with 1.2 L fluid drained. Nondiagnostic cytology. Again thoracentesis 06/30/14 with 600 cc fluid drained. - Stable respiratory status.  Active Problem: Large soft tissue mass in the right hilum / Squamous cell carcinoma  - Mass apparently obstructs the bronchus intermedius. Patient had bronchoscopy 06/28/2014 which  revealed squamous cell carcinoma. - PAC placed 07/06/2014 - Chemo with carboplatin and etoposide 07/06/2014. - MRI of the brain is negative for metastasis. Bone scan with no bony metastasis.  Fungus on BAL  - Per Dr. Bobby Rumpf (ID), given that right bronchus intermedius was totally occluded would not treat at this time. - Continue fluconazole 100 mg daily  Bradycardia - Unclear etiology - HR 55 this am - Asymptomatic  Hypovolemic Shock / Sepsis secondary to PNA  - Resolved. - Continue Decadron 6 mg every 12 hours.  Hyponatremia  - Dehydration versus SIADH from malignancy - Sodium 133.  Mild hypokalemia - Probably from sepsis. Supplemented and potassium within normal limits.  COPD - Respiratory status stable.  Hyperglycemia, steroid-induced - Hemoglobin A1c 6%.  Diarrhea  - Resolved.   Anemia of chronic disease - Likely anemia secondary to malignancy.  - Hemoglobin stable. Check CBC in am. - No current indications for transfusion.  PVD s/p stent placement to right external iliac artery 05/15/14 (Dr. Trula Slade) -  Stopped Plavix prior to Port-A-Cath placement. Resume plavix.  Severe protein calorie malnutrition - Due to chronic illness, malignancy.  - Continue Marinol and nutritional supplementation.   DVT Prophylaxis  -Heparin subQ ordered while patient in hospital.   Code Status: Full.  Family Communication:  plan of care discussed with the patient Disposition Plan: continue to monitor after being given chemo 07/06/2014.  IV access:  [place] 07/06/2014  Procedures and diagnostic studies:    MRI brain 07/03/2014 - no evidence of intracranial metastasis  NM whole body bone scan 07/03/2014 - no findings suspicious for bony metastasis  Dg Chest 1 View 06/30/2014 1. Interval reduction/near resolution of left-sided effusion post thoracentesis. No pneumothorax. 2. Interval increase in small to moderate-sized  potentially partially loculated effusion with  worsening right mid and lower lung heterogeneous/consolidative opacities. Electronically Signed By: Sandi Mariscal M.D. On: 06/30/2014 12:54   US Thoracentesis Asp Pleural Space W/img Guide 06/30/2014 Successful ultrasound-guided left sided thoracentesis yielding 660 cc of pleural fluid.  Ct Abdomen Pelvis W Contrast 06/29/2014 RIGHT infrahilar mass 4.3 x 4.0 cm with suspected RIGHT hilar adenopathy. Emphysematous changes with subtotal collapse of RIGHT lower lobe with slight pulmonary infiltrates question pneumonia versus lymphangitic tumor. Ascites and diffuse soft tissue edema question anasarca versus hypoproteinemia. No acute intra-abdominal or intrapelvic metastases.   Dg Chest Port 1 View 06/25/2014 Bilateral lower lobe airspace opacities and layering effusions, both worsening on the left since prior study. No pneumothorax.  Ct Chest W Contrast 3/4/2016Severe post obstructive pneumonia right lower lobe with extension of pneumonia into the right middle lobe and right upper lobe. Large soft tissue mass in the right hilum obstructs the bronchus intermedius. Malignancy suspected There are bilateral pleural effusions. There is infiltrate in the left lower lobe which, although dependent in position, is concerning for pneumonia as well. Significant atelectasis is an alternative consideration.  Dg Chest Port 1 View 3/3/2016Continue slight worsening of right basilar pneumonia, possibly postobstructive in etiology based on prior studies.  Dg Chest Port 1 View 3/3/20161. Worsening of infiltrate in the right lung base most consistent with pneumonia. Small areas a cavitation cannot be excluded within this focus of pneumonia. Consider followup chest x-ray or CT chest. 2. Hyperaeration consistent with emphysema.  Dg Abd Acute W/chest 06/20/2014 Right lower lobe consolidation compatible with pneumonia. COPD.    Medical Consultants:  Dr. Burney Gauze, Oncology Dr. Eppie Gibson,  Radiation Oncology IR Dr. Bobby Rumpf, ID  IAnti-Infectives:   Cefepime 06/20/14--> 06/21/14 Levaquin 06/20/14--->06/20/14 Vanc 06/20/14--- > 06/22/14 Zosyn 06/22/14---> 07/05/2014   Leisa Lenz, MD  [facility] Pager [phone]  If 7PM-7AM, please contact night-coverage [email or web address] Password [facility] 07/07/2014, 2:34 PM   LOS: 18 days    HPI/Subjective: No acute overnight events.  Objective: Filed Vitals:   07/06/14 2215 07/07/14 0334 07/07/14 0510 07/07/14 1427  BP: 112/69  126/78 106/55  Pulse: 50  52 55  Temp: 97.7 F (36.5 C)  98.5 F (36.9 C) 97.7 F (36.5 C)  TempSrc: Oral  Oral Oral  Resp: 17  18 16   Height:      Weight:  52.4 kg (115 lb 8.3 oz)    SpO2: 100%  98% 98%    Intake/Output Summary (Last 24 hours) at 07/07/14 1434 Last data filed at 07/07/14 1300  Gross per 24 hour  Intake    720 ml  Output    800 ml  Net    -80 ml    Exam:   General:  Pt is alert, awake, no distress  Cardiovascular: rate controlled, Appreciate S1,S2  Respiratory:no wheezing, no crackles   Abdomen: appreciate BS, non tender, non distended   Extremities: No edema, pulses palpable   Neuro: No focal deficits   Data Reviewed: Basic Metabolic Panel:  Recent Labs Lab 07/01/14 0402 07/01/14 1657 07/02/14 0328 07/04/14 0435  NA 136  --   --  133*  K 2.6* 3.4*  --  3.8  CL 101  --   --  99  CO2 28  --   --  27  GLUCOSE 116*  --   --  143*  BUN 12  --   --  18  CREATININE 0.52  --   --  0.58  CALCIUM 7.8*  --   --  8.2*  MG 2.0 2.0 1.9  --    Liver Function Tests:  Recent Labs Lab 07/01/14 0402 07/04/14 0435  AST 31 19  ALT 43 30  ALKPHOS 51 63  BILITOT 0.4 0.5  PROT 4.7* 5.5*  ALBUMIN 1.9* 2.5*   No results for input(s): LIPASE, AMYLASE in the last 168 hours. No results for input(s): AMMONIA in the last 168 hours. CBC:  Recent Labs Lab 07/01/14 0402 07/02/14 0328 07/04/14 0435  WBC 9.8 10.5 9.5  NEUTROABS 7.6 8.1*  --   HGB 9.9* 10.0* 11.5*   HCT 28.9* 29.6* 33.2*  MCV 86.5 88.9 90.0  PLT 398 370 378   Cardiac Enzymes: No results for input(s): CKTOTAL, CKMB, CKMBINDEX, TROPONINI in the last 168 hours. BNP: Invalid input(s): POCBNP CBG:  Recent Labs Lab 07/06/14 1638 07/06/14 2229 07/07/14 0725 07/07/14 1147 07/07/14 1242  GLUCAP 125* 121* 98 129* 89    Recent Results (from the past 240 hour(s))  AFB culture with smear     Status: None (Preliminary result)   Collection Time: 06/28/14 11:19 AM  Result Value Ref Range Status   Specimen Description BRONCHIAL ALVEOLAR LAVAGE  Final   Special Requests Normal  Final   Acid Fast Smear   Final    NO ACID FAST BACILLI SEEN Performed at [facility]    Culture   Final    CULTURE WILL BE EXAMINED FOR 6 WEEKS BEFORE ISSUING A FINAL REPORT Performed at [facility]    Report Status PENDING  Incomplete  Fungus Culture with Smear     Status: None (Preliminary result)   Collection Time: 06/28/14 11:19 AM  Result Value Ref Range Status   Specimen Description BRONCHIAL ALVEOLAR LAVAGE  Final   Special Requests Normal  Final   Fungal Smear   Final    NO YEAST OR FUNGAL ELEMENTS SEEN Performed at [facility]    Culture   Final    CANDIDA ALBICANS Performed at [facility]    Report Status PENDING  Incomplete  Culture, bal-quantitative     Status: None   Collection Time: 06/28/14 11:19 AM  Result Value Ref Range Status   Specimen Description BRONCHIAL ALVEOLAR LAVAGE  Final   Special Requests NONE  Final   Gram Stain   Final    FEW WBC PRESENT,BOTH PMN AND MONONUCLEAR RARE SQUAMOUS EPITHELIAL CELLS PRESENT NO ORGANISMS SEEN Performed at [place]   Final   Culture   Final    CANDIDA ALBICANS Performed at [facility]    Report Status 06/30/2014 FINAL  Final     Scheduled Meds: . dexamethasone  6 mg Oral Q12H  . dronabinol  5 mg Oral BID AC  . feeding supplement (ENSURE COMPLETE)  237 mL  Oral TID BM  . fluconazole  100 mg Oral Daily  . heparin  5,000 Units Subcutaneous 3 times per day  . insulin aspart  0-15 Units Subcutaneous TID WC  . multivitamin with minerals  1 tablet Oral Daily  . polyethylene glycol  17 g Oral Daily  . sodium chloride  10-40 mL Intracatheter Q12H   Continuous Infusions:

## 2014-07-08 LAB — BASIC METABOLIC PANEL
ANION GAP: 10 (ref 5–15)
BUN: 24 mg/dL — ABNORMAL HIGH (ref 6–23)
CO2: 27 mmol/L (ref 19–32)
Calcium: 8.6 mg/dL (ref 8.4–10.5)
Chloride: 97 mmol/L (ref 96–112)
Creatinine, Ser: 0.43 mg/dL — ABNORMAL LOW (ref 0.50–1.35)
GFR calc Af Amer: >90 mL/min (ref >90)
Glucose, Bld: 139 mg/dL — ABNORMAL HIGH (ref 70–99)
Potassium: 4.3 mmol/L (ref 3.5–5.1)
Sodium: 134 mmol/L — ABNORMAL LOW (ref 135–145)

## 2014-07-08 LAB — GLUCOSE, CAPILLARY
Glucose-Capillary: 114 mg/dL — ABNORMAL HIGH (ref 70–99)
Glucose-Capillary: 122 mg/dL — ABNORMAL HIGH (ref 70–99)
Glucose-Capillary: 146 mg/dL — ABNORMAL HIGH (ref 70–99)
Glucose-Capillary: 162 mg/dL — ABNORMAL HIGH (ref 70–99)

## 2014-07-08 LAB — CBC
HCT: 37.8 % — ABNORMAL LOW (ref 39.0–52.0)
HEMOGLOBIN: 12.4 g/dL — AB (ref 13.0–17.0)
MCH: 30.7 pg (ref 26.0–34.0)
MCHC: 32.8 g/dL (ref 30.0–36.0)
MCV: 93.6 fL (ref 78.0–100.0)
PLATELETS: 352 10*3/uL (ref 150–400)
RBC: 4.04 MIL/uL — ABNORMAL LOW (ref 4.22–5.81)
RDW: 19.9 % — AB (ref 11.5–15.5)
WBC: 11.2 10*3/uL — ABNORMAL HIGH (ref 4.0–10.5)

## 2014-07-08 MED ORDER — INSULIN ASPART 100 UNIT/ML ~~LOC~~ SOLN
0.0000 [IU] | Freq: Three times a day (TID) | SUBCUTANEOUS | Status: DC
  Administered 2014-07-08: 2 [IU] via SUBCUTANEOUS
  Administered 2014-07-08: 1 [IU] via SUBCUTANEOUS
  Administered 2014-07-09: 2 [IU] via SUBCUTANEOUS
  Administered 2014-07-09: 3 [IU] via SUBCUTANEOUS

## 2014-07-08 NOTE — Progress Notes (Signed)
Pt has pulled out [place] cath. IV team notified

## 2014-07-08 NOTE — Progress Notes (Signed)
MD made aware of Pt pulling out [facility]. Maintain current plan of care

## 2014-07-08 NOTE — Progress Notes (Signed)
Patient ID: Dustin Freeman, male   DOB: 12/07/1955, 59 y.o.   MRN: [id number] [facility] PROGRESS NOTE  Dustin Freeman:[id number] DOB: Jan 06, 1956 DOA: 06/20/2014 PCP: No PCP Per Patient  Brief narrative:    59 y.o. male with a PMH of DM type II and PVD s/p stent placement right external iliac artery, brought to [facility] ED 06/20/14 for dizziness, cough, diarrhea. Hospitalized 3 weeks prior for syncope and underwent stent placement in occluded right external iliac artery. Ever since being discharged had diarrhea. On this admission, patient also reported weight loss of about 10 pounds and he reported history of smoking half pack per day for 48 years only recently quit. On this admission, workup revealed a large right infrahilar mass with suspicious right hilar and subcarinal adenopathy and postobstructive process, subtotal right lower lung collapse. In addition he was noted to have diffuse infiltrative process and bilateral pleural effusions and he underwent thoracentesis twice since this admission. 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 Oncology and radiation oncology on board. PAC placed 3/18 and chemo started 07/06/2014 (carboplatin and etoposide).   Assessment/Plan:    Principal problem:  Extensive RLL, RML, and partial RUL postobustructive pneumonia / bilateral pleural effusion - Patient was started on broad-spectrum antibiotics on admission for treatment of postobstructive pneumonia.  - Please see section on antiinfectives below about the duration of all antibiotics - Patient is s/p thoracentesis on 06/25/2014 with 1.2 L fluid drained. Cytology was non diagnostic. Thoracentesis repeated 06/30/14 with 600 cc fluid drained.  Active Problem: Large soft tissue mass in the right hilum / Squamous cell carcinoma  - Mass obstructing the bronchus intermedius.  - S/P bronchoscopy 06/28/2014 which revealed squamous  cell carcinoma. MRI of the brain is negative for metastasis. Bone scan with no bony metastasis. - PAC placed 07/06/2014 - Started chemo with carboplatin and etoposide 07/06/2014.  Fungus on BAL  - Per Dr. Bobby Rumpf (ID), given that right bronchus intermedius was totally occluded would not treat at this time. - Continue fluconazole  Bradycardia - Unclear etiology. Spontaneously improved. HR 77 this am.  Hypovolemic Shock / Sepsis secondary to PNA  - Resolved. - Continue Decadron 6 mg every 12 hours.  Hyponatremia  - Dehydration versus SIADH from malignancy - Sodium 134.  Mild hypokalemia - Likely from sepsis.  - Supplemented  COPD - Stable.  Hyperglycemia, steroid-induced - Hemoglobin A1c 6%.  Diarrhea  - Resolved.   Anemia of chronic disease - Secondary to malignancy.  - Hemoglobin stable.   PVD s/p stent placement to right external iliac artery 05/15/14 (Dr. Trula Slade) -  Stopped Plavix prior to Port-A-Cath placement. Back on plavix now.  Severe protein calorie malnutrition - Due to chronic illness, malignancy.  - Continue Marinol and nutritional supplementation.  - Diet as tolerated   DVT Prophylaxis  - Heparin subQ ordered while patient in hospital.   Code Status: Full.  Family Communication:  plan of care discussed with the patient Disposition Plan: hope to discharge in next 24 hours  IV access:  [facility] 07/06/2014  Procedures and diagnostic studies:    MRI brain 07/03/2014 - no evidence of intracranial metastasis  NM whole body bone scan 07/03/2014 - no findings suspicious for bony metastasis  Dg Chest 1 View 06/30/2014 1. Interval reduction/near resolution of left-sided effusion post thoracentesis. No pneumothorax. 2. Interval increase in small to moderate-sized potentially partially loculated effusion with worsening right mid and lower lung heterogeneous/consolidative  opacities. Electronically Signed By: Sandi Mariscal M.D. On: 06/30/2014 12:54    US Thoracentesis Asp Pleural Space W/img Guide 06/30/2014 Successful ultrasound-guided left sided thoracentesis yielding 660 cc of pleural fluid.  Ct Abdomen Pelvis W Contrast 06/29/2014 RIGHT infrahilar mass 4.3 x 4.0 cm with suspected RIGHT hilar adenopathy. Emphysematous changes with subtotal collapse of RIGHT lower lobe with slight pulmonary infiltrates question pneumonia versus lymphangitic tumor. Ascites and diffuse soft tissue edema question anasarca versus hypoproteinemia. No acute intra-abdominal or intrapelvic metastases.   Dg Chest Port 1 View 06/25/2014 Bilateral lower lobe airspace opacities and layering effusions, both worsening on the left since prior study. No pneumothorax.  Ct Chest W Contrast 3/4/2016Severe post obstructive pneumonia right lower lobe with extension of pneumonia into the right middle lobe and right upper lobe. Large soft tissue mass in the right hilum obstructs the bronchus intermedius. Malignancy suspected There are bilateral pleural effusions. There is infiltrate in the left lower lobe which, although dependent in position, is concerning for pneumonia as well. Significant atelectasis is an alternative consideration.  Dg Chest Port 1 View 3/3/2016Continue slight worsening of right basilar pneumonia, possibly postobstructive in etiology based on prior studies.  Dg Chest Port 1 View 3/3/20161. Worsening of infiltrate in the right lung base most consistent with pneumonia. Small areas a cavitation cannot be excluded within this focus of pneumonia. Consider followup chest x-ray or CT chest. 2. Hyperaeration consistent with emphysema.  Dg Abd Acute W/chest 06/20/2014 Right lower lobe consolidation compatible with pneumonia. COPD.    Medical Consultants:  Dr. Burney Gauze, Oncology Dr. Eppie Gibson, Radiation Oncology IR Dr. Bobby Rumpf, ID  IAnti-Infectives:   Cefepime 06/20/14--> 06/21/14 Levaquin 06/20/14--->06/20/14 Vanc 06/20/14--- >  06/22/14 Zosyn 06/22/14---> 07/05/2014   Leisa Lenz, MD  [facility] Pager [phone]  If 7PM-7AM, please contact night-coverage [email or web address] Password [email or web address] 07/08/2014, 2:07 PM   LOS: 19 days    HPI/Subjective: No acute overnight events.  Objective: Filed Vitals:   07/07/14 0510 07/07/14 1427 07/07/14 2041 07/08/14 0420  BP: 126/78 106/55 114/69 119/64  Pulse: 52 55 72 77  Temp: 98.5 F (36.9 C) 97.7 F (36.5 C) 97.7 F (36.5 C) 97.7 F (36.5 C)  TempSrc: Oral Oral Oral Oral  Resp: 18 16 18 18   Height:      Weight:    51.3 kg (113 lb 1.5 oz)  SpO2: 98% 98% 98% 99%    Intake/Output Summary (Last 24 hours) at 07/08/14 1406 Last data filed at 07/08/14 0612  Gross per 24 hour  Intake    360 ml  Output    750 ml  Net   -390 ml    Exam:   General:  Pt is alert, no distress  Cardiovascular: RRR, S1,S2 (+)  Respiratory:bilateral air entry, no wheezing    Abdomen: no tenderness, non distended, (+) BS  Extremities: pulses palpable, no swelling in LE  Neuro: alert, oriented   Data Reviewed: Basic Metabolic Panel:  Recent Labs Lab 07/01/14 1657 07/02/14 0328 07/04/14 0435 07/08/14 0535  NA  --   --  133* 134*  K 3.4*  --  3.8 4.3  CL  --   --  99 97  CO2  --   --  27 27  GLUCOSE  --   --  143* 139*  BUN  --   --  18 24*  CREATININE  --   --  0.58 0.43*  CALCIUM  --   --  8.2* 8.6  MG 2.0 1.9  --   --  Liver Function Tests:  Recent Labs Lab 07/04/14 0435  AST 19  ALT 30  ALKPHOS 63  BILITOT 0.5  PROT 5.5*  ALBUMIN 2.5*   No results for input(s): LIPASE, AMYLASE in the last 168 hours. No results for input(s): AMMONIA in the last 168 hours. CBC:  Recent Labs Lab 07/02/14 0328 07/04/14 0435 07/08/14 0535  WBC 10.5 9.5 11.2*  NEUTROABS 8.1*  --   --   HGB 10.0* 11.5* 12.4*  HCT 29.6* 33.2* 37.8*  MCV 88.9 90.0 93.6  PLT 370 378 352   Cardiac Enzymes: No results for input(s): CKTOTAL, CKMB, CKMBINDEX, TROPONINI in the last  168 hours. BNP: Invalid input(s): POCBNP CBG:  Recent Labs Lab 07/07/14 1242 07/07/14 1740 07/07/14 2137 07/08/14 0759 07/08/14 1205  GLUCAP 89 190* 119* 114* 122*    Recent Results (from the past 240 hour(s))  AFB culture with smear     Status: None (Preliminary result)   Collection Time: 06/28/14 11:19 AM  Result Value Ref Range Status   Specimen Description BRONCHIAL ALVEOLAR LAVAGE  Final   Special Requests Normal  Final   Acid Fast Smear   Final    NO ACID FAST BACILLI SEEN Performed at [facility]    Culture   Final    CULTURE WILL BE EXAMINED FOR 6 WEEKS BEFORE ISSUING A FINAL REPORT Performed at [facility]    Report Status PENDING  Incomplete  Fungus Culture with Smear     Status: None (Preliminary result)   Collection Time: 06/28/14 11:19 AM  Result Value Ref Range Status   Specimen Description BRONCHIAL ALVEOLAR LAVAGE  Final   Special Requests Normal  Final   Fungal Smear   Final    NO YEAST OR FUNGAL ELEMENTS SEEN Performed at [facility]    Culture   Final    CANDIDA ALBICANS Performed at [facility]    Report Status PENDING  Incomplete  Culture, bal-quantitative     Status: None   Collection Time: 06/28/14 11:19 AM  Result Value Ref Range Status   Specimen Description BRONCHIAL ALVEOLAR LAVAGE  Final   Special Requests NONE  Final   Gram Stain   Final    FEW WBC PRESENT,BOTH PMN AND MONONUCLEAR RARE SQUAMOUS EPITHELIAL CELLS PRESENT NO ORGANISMS SEEN Performed at [place]   Final   Culture   Final    CANDIDA ALBICANS Performed at [facility]    Report Status 06/30/2014 FINAL  Final     Scheduled Meds: . dexamethasone  6 mg Oral Q12H  . dronabinol  5 mg Oral BID AC  . feeding supplement (ENSURE COMPLETE)  237 mL Oral TID BM  . fluconazole  100 mg Oral Daily  . heparin  5,000 Units Subcutaneous 3 times per day  . insulin aspart  0-15 Units Subcutaneous TID WC  .  multivitamin with minerals  1 tablet Oral Daily  . polyethylene glycol  17 g Oral Daily  . sodium chloride  10-40 mL Intracatheter Q12H   Continuous Infusions:

## 2014-07-09 ENCOUNTER — Ambulatory Visit
Admit: 2014-07-09 | Discharge: 2014-07-09 | Disposition: A | Discharge status: Home / Self Care | Payer: MEDICAID | Attending: Radiation Oncology | Admitting: Radiation Oncology

## 2014-07-09 ENCOUNTER — Inpatient Hospital Stay (HOSPITAL_COMMUNITY): Payer: Medicaid Other

## 2014-07-09 ENCOUNTER — Ambulatory Visit
Admit: 2014-07-09 | Discharge: 2014-07-09 | Disposition: A | Discharge status: Home / Self Care | Payer: Medicaid Other | Attending: Radiation Oncology | Admitting: Radiation Oncology

## 2014-07-09 ENCOUNTER — Ambulatory Visit: Payer: MEDICAID | Admitting: Radiation Oncology

## 2014-07-09 DIAGNOSIS — C3491 Malignant neoplasm of unspecified part of right bronchus or lung: Secondary | ICD-10-CM

## 2014-07-09 LAB — GLUCOSE, CAPILLARY
GLUCOSE-CAPILLARY: 146 mg/dL — AB (ref 70–99)
Glucose-Capillary: 136 mg/dL — ABNORMAL HIGH (ref 70–99)
Glucose-Capillary: 160 mg/dL — ABNORMAL HIGH (ref 70–99)
Glucose-Capillary: 152 mg/dL — ABNORMAL HIGH (ref 70–99)

## 2014-07-09 MED ORDER — BISACODYL 10 MG RE SUPP
10.0000 mg | Freq: Every day | RECTAL | Status: DC | PRN
  Administered 2014-07-09: 10 mg via RECTAL
  Filled 2014-07-09: qty 1

## 2014-07-09 MED ORDER — DEXAMETHASONE 4 MG PO TABS
4.0000 mg | ORAL_TABLET | Freq: Two times a day (BID) | ORAL | Status: DC
  Administered 2014-07-09 – 2014-07-10 (×2): 4 mg via ORAL
  Filled 2014-07-09 (×4): qty 1

## 2014-07-09 MED ORDER — SENNOSIDES-DOCUSATE SODIUM 8.6-50 MG PO TABS
2.0000 | ORAL_TABLET | Freq: Every day | ORAL | Status: DC
  Administered 2014-07-09: 2 via ORAL
  Filled 2014-07-09: qty 2

## 2014-07-09 MED ORDER — ONDANSETRON HCL 4 MG/2ML IJ SOLN
4.0000 mg | Freq: Four times a day (QID) | INTRAMUSCULAR | Status: DC | PRN
  Administered 2014-07-10: 4 mg via INTRAVENOUS
  Filled 2014-07-09: qty 2

## 2014-07-09 NOTE — Progress Notes (Addendum)
Patient ID: Dustin Freeman, male   DOB: 01/22/1956, 59 y.o.   MRN: [id number] [facility] PROGRESS NOTE  Dustin Freeman AYT:[id number] DOB: [birth year] DOA: 06/20/2014 PCP: No PCP Per Patient  Brief narrative:    59 y.o. male with a PMH of DM type II and PVD s/p stent placement right external iliac artery, brought to [facility] ED 06/20/14 for dizziness, cough, diarrhea. Hospitalized 3 weeks prior for syncope and underwent stent placement in occluded right external iliac artery. Ever since being discharged had diarrhea. On this admission, patient also reported weight loss of about 10 pounds and he reported history of smoking half pack per day for 48 years only recently quit. On this admission, workup revealed a large right infrahilar mass with suspicious right hilar and subcarinal adenopathy and postobstructive process, subtotal right lower lung collapse. In addition he was noted to have diffuse infiltrative process and bilateral pleural effusions and he underwent thoracentesis twice since this admission. 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 Oncology and radiation oncology on board. PAC placed 3/18 and chemo started 07/06/2014 (carboplatin and etoposide).   Assessment/Plan:    Principal problem:  Extensive RLL, RML, and partial RUL postobustructive pneumonia versus healthcare associated pneumonia / bilateral pleural effusion - Patient was started on broad-spectrum antibiotics on admission for treatment of postobstructive pneumonia. All antibiotics now stopped. Please refer to anti-infectives section below about the duration of specific antibiotics. - Status post thoracentesis on 06/25/2014 with 1.2 L fluid drained. Cytology was non diagnostic. Thoracentesis repeated 06/30/14 with 600 cc fluid drained.  Active Problem: Large soft tissue mass in the right hilum / Squamous cell carcinoma  - S/P bronchoscopy 06/28/2014  which revealed squamous cell carcinoma. MRI of the brain is negative for metastasis. Bone scan with no bony metastasis. - PAC placed 07/06/2014 - Chemo with carboplatin and etoposide 07/06/2014. - We will see with oncology there is a plan for radiation therapy while patient is in hospital.  Fungus on BAL  - Per Dr. Bobby Rumpf (ID), given that right bronchus intermedius was totally occluded would not treat at this time. - Continue fluconazole 100 mg daily.  Bradycardia - Unclear etiology. Spontaneously improved. HR 77 this am.  Hypovolemic Shock / Sepsis secondary to PNA  - Resolved. - Continue Decadron. I think we can start tapering down to 4 mg twice daily instead of 6 mg twice daily.  Hyponatremia  - Dehydration versus SIADH from malignancy - Sodium stable.  Mild hypokalemia - Likely from sepsis.  - Supplemented  COPD - Respiratory status stable.  Hyperglycemia, steroid-induced - Hemoglobin A1c 6%.  Diarrhea  - Resolved.   Anemia of chronic disease - Secondary to malignancy.  - No current indications for transfusion. Hemoglobin stable.  PVD s/p stent placement to right external iliac artery 05/15/14 (Dr. Trula Slade) - Plavix was on hold prior to Port-A-Cath placement. Resumed.  Severe protein calorie malnutrition - Due to chronic illness, malignancy.  - Continue Marinol and nutritional supplementation.  - Diet as tolerated   DVT Prophylaxis  - Heparin subQ ordered while patient in hospital.   Code Status: Full.  Family Communication:  plan of care discussed with the patient Disposition Plan: Possible discharge in next 24 hours. We'll see if plan for radiation therapy.  IV access:  [facility] 07/06/2014  Procedures and diagnostic studies:    MRI brain 07/03/2014 - no evidence of intracranial metastasis  NM whole body bone scan 07/03/2014 - no  findings suspicious for bony metastasis  Dg Chest 1 View 06/30/2014 1. Interval reduction/near resolution of left-sided  effusion post thoracentesis. No pneumothorax. 2. Interval increase in small to moderate-sized potentially partially loculated effusion with worsening right mid and lower lung heterogeneous/consolidative opacities. Electronically Signed By: Sandi Mariscal M.D. On: 06/30/2014 12:54   US Thoracentesis Asp Pleural Space W/img Guide 06/30/2014 Successful ultrasound-guided left sided thoracentesis yielding 660 cc of pleural fluid.  Ct Abdomen Pelvis W Contrast 06/29/2014 RIGHT infrahilar mass 4.3 x 4.0 cm with suspected RIGHT hilar adenopathy. Emphysematous changes with subtotal collapse of RIGHT lower lobe with slight pulmonary infiltrates question pneumonia versus lymphangitic tumor. Ascites and diffuse soft tissue edema question anasarca versus hypoproteinemia. No acute intra-abdominal or intrapelvic metastases.   Dg Chest Port 1 View 06/25/2014 Bilateral lower lobe airspace opacities and layering effusions, both worsening on the left since prior study. No pneumothorax.  Ct Chest W Contrast 3/4/2016Severe post obstructive pneumonia right lower lobe with extension of pneumonia into the right middle lobe and right upper lobe. Large soft tissue mass in the right hilum obstructs the bronchus intermedius. Malignancy suspected There are bilateral pleural effusions. There is infiltrate in the left lower lobe which, although dependent in position, is concerning for pneumonia as well. Significant atelectasis is an alternative consideration.  Dg Chest Port 1 View 3/3/2016Continue slight worsening of right basilar pneumonia, possibly postobstructive in etiology based on prior studies.  Dg Chest Port 1 View 3/3/20161. Worsening of infiltrate in the right lung base most consistent with pneumonia. Small areas a cavitation cannot be excluded within this focus of pneumonia. Consider followup chest x-ray or CT chest. 2. Hyperaeration consistent with emphysema.  Dg Abd Acute W/chest 06/20/2014 Right  lower lobe consolidation compatible with pneumonia. COPD.    Medical Consultants:  Dr. Burney Gauze, Oncology Dr. Eppie Gibson, Radiation Oncology IR Dr. Bobby Rumpf, ID  IAnti-Infectives:   Cefepime 06/20/14--> 06/21/14 Levaquin 06/20/14--->06/20/14 Vanc 06/20/14--- > 06/22/14 Zosyn 06/22/14---> 07/05/2014   Leisa Lenz, MD  [facility] Pager [phone]  If 7PM-7AM, please contact night-coverage [email or web address] Password [facility] 07/09/2014, 11:15 AM   LOS: 20 days    HPI/Subjective: No acute overnight events.  Objective: Filed Vitals:   07/08/14 0420 07/08/14 1520 07/08/14 1950 07/09/14 0435  BP: 119/64 119/68 131/69 132/82  Pulse: 77 63 68 56  Temp: 97.7 F (36.5 C) 98 F (36.7 C) 97.9 F (36.6 C) 97.4 F (36.3 C)  TempSrc: Oral Oral Oral Oral  Resp: 18 14 16 16   Height:      Weight: 51.3 kg (113 lb 1.5 oz)   51.2 kg (112 lb 14 oz)  SpO2: 99% 99% 99% 99%    Intake/Output Summary (Last 24 hours) at 07/09/14 1115 Last data filed at 07/09/14 0758  Gross per 24 hour  Intake    960 ml  Output   1400 ml  Net   -440 ml    Exam:   General:  Pt is not in distress  Cardiovascular: RRR, S1,S2 (+)  Respiratory: Clear to auscultation, no wheezing, no crackles  Abdomen: Abdomen nontender, no distention, appreciate bowel sounds  Extremities: No lower extremity edema, pulses palpable  Neuro: No focal deficits  Data Reviewed: Basic Metabolic Panel:  Recent Labs Lab 07/04/14 0435 07/08/14 0535  NA 133* 134*  K 3.8 4.3  CL 99 97  CO2 27 27  GLUCOSE 143* 139*  BUN 18 24*  CREATININE 0.58 0.43*  CALCIUM 8.2* 8.6   Liver Function Tests:  Recent Labs Lab  07/04/14 0435  AST 19  ALT 30  ALKPHOS 63  BILITOT 0.5  PROT 5.5*  ALBUMIN 2.5*   No results for input(s): LIPASE, AMYLASE in the last 168 hours. No results for input(s): AMMONIA in the last 168 hours. CBC:  Recent Labs Lab 07/04/14 0435 07/08/14 0535  WBC 9.5 11.2*  HGB 11.5* 12.4*  HCT  33.2* 37.8*  MCV 90.0 93.6  PLT 378 352   Cardiac Enzymes: No results for input(s): CKTOTAL, CKMB, CKMBINDEX, TROPONINI in the last 168 hours. BNP: Invalid input(s): POCBNP CBG:  Recent Labs Lab 07/08/14 0759 07/08/14 1205 07/08/14 1709 07/08/14 2107 07/09/14 0732  GLUCAP 114* 122* 146* 162* 136*    Recent Results (from the past 240 hour(s))  AFB culture with smear     Status: None (Preliminary result)   Collection Time: 06/28/14 11:19 AM  Result Value Ref Range Status   Specimen Description BRONCHIAL ALVEOLAR LAVAGE  Final   Special Requests Normal  Final   Acid Fast Smear   Final    NO ACID FAST BACILLI SEEN Performed at [facility]    Culture   Final    CULTURE WILL BE EXAMINED FOR 6 WEEKS BEFORE ISSUING A FINAL REPORT Performed at [facility]    Report Status PENDING  Incomplete  Fungus Culture with Smear     Status: None (Preliminary result)   Collection Time: 06/28/14 11:19 AM  Result Value Ref Range Status   Specimen Description BRONCHIAL ALVEOLAR LAVAGE  Final   Special Requests Normal  Final   Fungal Smear   Final    NO YEAST OR FUNGAL ELEMENTS SEEN Performed at [facility]    Culture   Final    CANDIDA ALBICANS Performed at [facility]    Report Status PENDING  Incomplete  Culture, bal-quantitative     Status: None   Collection Time: 06/28/14 11:19 AM  Result Value Ref Range Status   Specimen Description BRONCHIAL ALVEOLAR LAVAGE  Final   Special Requests NONE  Final   Gram Stain   Final    FEW WBC PRESENT,BOTH PMN AND MONONUCLEAR RARE SQUAMOUS EPITHELIAL CELLS PRESENT NO ORGANISMS SEEN Performed at [facility]   Final   Culture   Final    CANDIDA ALBICANS Performed at [facility]    Report Status 06/30/2014 FINAL  Final     Scheduled Meds: . dexamethasone  6 mg Oral Q12H  . dronabinol  5 mg Oral BID AC  . feeding supplement (ENSURE COMPLETE)  237 mL Oral TID BM  .  fluconazole  100 mg Oral Daily  . heparin  5,000 Units Subcutaneous 3 times per day  . insulin aspart  0-15 Units Subcutaneous TID WC  . multivitamin with minerals  1 tablet Oral Daily  . polyethylene glycol  17 g Oral Daily  . sodium chloride  10-40 mL Intracatheter Q12H   Continuous Infusions:

## 2014-07-09 NOTE — Progress Notes (Signed)
Pt with abd pain Order placed for abd x ray  Leisa Lenz [facility] [phone]

## 2014-07-09 NOTE — Progress Notes (Signed)
Spoke with Archer Asa RN for patient in room [phone]. She states he is no longer on telemetry, is not scheduled for d/c today at this point. She states he has no c/o pain. Informed her his radiation treatment is scheduled for 4:45 pm today but may be moved to earlier time. She states he should travel by bed due to being fall risk. [place], RT linac 1 notified of above, verbalized understanding.

## 2014-07-09 NOTE — Progress Notes (Signed)
Per Dustin Freeman, RT on linac #1, patient returned to inpatient unit prior to nurse assessment and weekly PUT with Dr Dustin Freeman.

## 2014-07-09 NOTE — Progress Notes (Signed)
   Weekly Management Note:  Inpatient    Current Dose:  2 Gy  Projected Dose: 60 Gy   Narrative:  The patient presents for routine under treatment assessment.  CBCT/MVCT images/Port film x-rays were reviewed.  The chart was checked. He is complaining of abdominal pain.  Has not had a recent BM but feels the need to try to produce one after I examine him.  Physical Findings:  Wt Readings from Last 3 Encounters:  07/09/14 112 lb 14 oz (51.2 kg)  05/16/14 116 lb 14.1 oz (53.016 kg)    height is 5\' 9"  (1.753 m) and weight is 112 lb 14 oz (51.2 kg). His oral temperature is 97.4 F (36.3 C). His blood pressure is 132/82 and his pulse is 56. His respiration is 16 and oxygen saturation is 99%.  Abdomen tender, especially in lower quadrants, with no rigidity or guarding.  CBC    Component Value Date/Time   WBC 11.2* 07/08/2014 0535   RBC 4.04* 07/08/2014 0535   HGB 12.4* 07/08/2014 0535   HCT 37.8* 07/08/2014 0535   PLT 352 07/08/2014 0535   MCV 93.6 07/08/2014 0535   MCH 30.7 07/08/2014 0535   MCHC 32.8 07/08/2014 0535   RDW 19.9* 07/08/2014 0535   LYMPHSABS 1.9 07/02/2014 0328   MONOABS 0.5 07/02/2014 0328   EOSABS 0.0 07/02/2014 0328   BASOSABS 0.0 07/02/2014 0328     CMP     Component Value Date/Time   NA 134* 07/08/2014 0535   K 4.3 07/08/2014 0535   CL 97 07/08/2014 0535   CO2 27 07/08/2014 0535   GLUCOSE 139* 07/08/2014 0535   BUN 24* 07/08/2014 0535   CREATININE 0.43* 07/08/2014 0535   CALCIUM 8.6 07/08/2014 0535   PROT 5.5* 07/04/2014 0435   ALBUMIN 2.5* 07/04/2014 0435   AST 19 07/04/2014 0435   ALT 30 07/04/2014 0435   ALKPHOS 63 07/04/2014 0435   BILITOT 0.5 07/04/2014 0435   GFRNONAA >90 07/08/2014 0535   GFRAA >90 07/08/2014 0535     Impression:  The patient is tolerating radiotherapy.   Plan:  Continue radiotherapy as planned. He will attempt a bowel movement now with assistance from the staff.  Chemotherapy is going to be held per Dr Marin Olp due to  limited performance status.  -----------------------------------  Eppie Gibson, MD

## 2014-07-09 NOTE — Progress Notes (Signed)
PT Cancellation Note  Patient Details Name: Dustin Freeman MRN: [id number] DOB: 1955/12/23   Cancelled Treatment:    Reason Eval/Treat Not Completed: Other (comment) patient was meeting with someone important at 1515 . Will check back in AM.   [name] 07/09/2014, 4:59 PM [name] PT [phone]

## 2014-07-09 NOTE — Progress Notes (Signed)
Pt complaining of severe abdominal pain 10/10.  Dr. Charlies Silvers notified, orders for abd x-ray placed. Vitals stable.  Will continue to monitor closely.

## 2014-07-09 NOTE — Progress Notes (Signed)
Dustin Freeman is up on 77 W. He actually looks pretty good. I cannot explain why his hemoglobin has returned to normal. His hemoglobin has been going up slowly. He has not been transfused. He does look a little bit better.  He says that he is eating better.  We had a long talk this morning. We talked about treating his lung cancer. I just don't think that he would be a good candidate for combined modality therapy.  I just don't think that he would go to handle chemotherapy along with radiation therapy. I would just favor radiation therapy alone. He agrees with this. I will have to speak with radiation therapy.  The left pleural effusion was negative for malignant cells. As such, he still has locally advanced disease. We can always add chemotherapy if he does improve with his performance status. Currently, his performance status is ECOG 2-3.  He probably would benefit from physical therapy. He is still very weak. He says he gets dizzy when he stands up.   On physical exam, his pulse is 56. Temperature 97.4. Blood pressure 130/82. His lungs show will ultimately good breath sounds bilaterally. There may be some slight decrease over on the right side. His cardiac exam is slow but regular. He has a 1/6 systolic murmur. Abdomen is soft. He has good bowel sounds. Extremity shows muscle atrophy in upper and lower extremities.  Dustin Freeman has stage IIIa/IIIB non-small cell lung cancer of the right lung. We are trying for combined modality therapy. Theoretically, there is still the possibility of cure but I sincerely doubt that this would happen as I don't think he is going to be held to tolerate aggressive therapy. For now, I think our goal is to try to shrink this tumor so that he will not run into problems with pneumonia and hopefully be able to gain some weight. We certainly could add chemotherapy in the future.  We had a great prayer session. His faith is still strong.   [place] 86:15

## 2014-07-10 ENCOUNTER — Ambulatory Visit
Admit: 2014-07-10 | Discharge: 2014-07-10 | Disposition: A | Discharge status: Home / Self Care | Payer: MEDICAID | Attending: Radiation Oncology | Admitting: Radiation Oncology

## 2014-07-10 ENCOUNTER — Ambulatory Visit
Admit: 2014-07-10 | Discharge: 2014-07-10 | Disposition: A | Discharge status: Home / Self Care | Payer: Medicaid Other | Attending: Radiation Oncology | Admitting: Radiation Oncology

## 2014-07-10 DIAGNOSIS — R531 Weakness: Secondary | ICD-10-CM

## 2014-07-10 DIAGNOSIS — R109 Unspecified abdominal pain: Secondary | ICD-10-CM

## 2014-07-10 LAB — GLUCOSE, CAPILLARY
GLUCOSE-CAPILLARY: 98 mg/dL (ref 70–99)
GLUCOSE-CAPILLARY: 121 mg/dL — AB (ref 70–99)
Glucose-Capillary: 97 mg/dL (ref 70–99)

## 2014-07-10 MED ORDER — SENNOSIDES-DOCUSATE SODIUM 8.6-50 MG PO TABS: 2.0000 | ORAL_TABLET | Freq: Every day | ORAL | Status: DC

## 2014-07-10 MED ORDER — FLUCONAZOLE 100 MG PO TABS: 100.0000 mg | ORAL_TABLET | Freq: Every day | ORAL | Status: DC

## 2014-07-10 MED ORDER — INSULIN ASPART 100 UNIT/ML ~~LOC~~ SOLN: 0.0000 [IU] | Freq: Three times a day (TID) | SUBCUTANEOUS | Status: DC

## 2014-07-10 MED ORDER — ENSURE COMPLETE PO LIQD
237.0000 mL | Freq: Three times a day (TID) | ORAL | Status: DC
Start: 2014-07-10 — End: 2015-11-07

## 2014-07-10 MED ORDER — OXYCODONE HCL 5 MG PO TABS: 5.0000 mg | ORAL_TABLET | Freq: Four times a day (QID) | ORAL | Status: DC | PRN

## 2014-07-10 MED ORDER — IPRATROPIUM-ALBUTEROL 0.5-2.5 (3) MG/3ML IN SOLN: 3.0000 mL | Freq: Four times a day (QID) | RESPIRATORY_TRACT | Status: DC | PRN

## 2014-07-10 MED ORDER — CETYLPYRIDINIUM CHLORIDE 0.05 % MT LIQD: 7.0000 mL | Freq: Two times a day (BID) | OROMUCOSAL | Status: DC

## 2014-07-10 MED ORDER — HEPARIN SOD (PORK) LOCK FLUSH 100 UNIT/ML IV SOLN
500.0000 [IU] | INTRAVENOUS | Status: AC | PRN
  Administered 2014-07-10: 500 [IU]

## 2014-07-10 MED ORDER — LORAZEPAM 1 MG PO TABS: 1.0000 mg | ORAL_TABLET | Freq: Two times a day (BID) | ORAL | Status: DC | PRN

## 2014-07-10 MED ORDER — PANTOPRAZOLE SODIUM 40 MG PO TBEC: 40.0000 mg | DELAYED_RELEASE_TABLET | Freq: Every day | ORAL | Status: DC

## 2014-07-10 MED ORDER — GI COCKTAIL ~~LOC~~
30.0000 mL | Freq: Once | ORAL | Status: AC
  Administered 2014-07-10: 30 mL via ORAL
  Filled 2014-07-10: qty 30

## 2014-07-10 MED ORDER — CHLORHEXIDINE GLUCONATE 0.12 % MT SOLN: 15.0000 mL | Freq: Two times a day (BID) | OROMUCOSAL | Status: DC

## 2014-07-10 MED ORDER — DRONABINOL 5 MG PO CAPS: 5.0000 mg | ORAL_CAPSULE | Freq: Two times a day (BID) | ORAL | Status: DC

## 2014-07-10 MED ORDER — DEXAMETHASONE 4 MG PO TABS: 4.0000 mg | ORAL_TABLET | Freq: Every day | ORAL | Status: DC

## 2014-07-10 MED ORDER — PANTOPRAZOLE SODIUM 40 MG PO TBEC
40.0000 mg | DELAYED_RELEASE_TABLET | Freq: Every day | ORAL | Status: DC
  Administered 2014-07-10: 40 mg via ORAL
  Filled 2014-07-10: qty 1

## 2014-07-10 NOTE — Progress Notes (Signed)
Patient education completed with patient today. Gave him "Radiation Therapy and You" booklet with all pertinent pages marked and discussed, [id number], skin irritation/care, throat irritation/care, nutrition, pain. Teach back method used. Advised pt to review booklet's pertinent information with his wife who he states is in his hospital room today.  Pt verbalized understanding. He is for d/c soon from hospital to rehab facility.

## 2014-07-10 NOTE — Progress Notes (Signed)
PT Cancellation Note  Patient Details Name: Dustin Freeman MRN: [id number] DOB: 03/15/1956   Cancelled Treatment:     pt declined any OOB activity stating he had a bad night of vomiting and wishes to stay in bed and rest.  Reported to RN.  Will re attempt as schedule permits.   Nathanial Rancher 07/10/2014, 10:35 AM

## 2014-07-10 NOTE — Progress Notes (Signed)
Porta cath deaccessed. Flushed with 10cc NS followed by Heparin 5ml (100u/ml). No bleeding to site, band aid to site for comfort. Erie Sica M 

## 2014-07-10 NOTE — Progress Notes (Signed)
Mr. Hair still is very weak. Unfortunately, I'm just not sure how much better he is going to be able to get. Although we don't have documented metastatic disease, he certainly is behaving like this.  He had abdominal pain last night. He had an abdominal x-ray done. This is showed stool in the descending colon. There is no operation. There is no obstruction. He seems to be doing better this morning.  He did not do any physical therapy yesterday. They came by to see him.  I talked with radiation oncology. We will try radiation for his thoracic disease. Again, I just do not know how he would tolerate this.  He is still cachectic. I'm unsure how much he really is eating.  On his physical exam, vital signs are stable. There is really no change in his exam. His abdomen is soft. He has bowel sounds. There is no distention. He has no abdominal mass. There is no palpable liver or spleen. Extremities shows most likely an upper and lower extremities.  At some point soon, I think we're going to have to make a decision as to how much he can really tolerate. Again, his performance status, at best, is ECOG 3. Even radiation therapy may not be suitable for him. I have encouraged him as much as possible. Again I do sono, she can really tolerate. I will recheck a prealbumin on him.  Pete E.

## 2014-07-10 NOTE — Discharge Instructions (Signed)
Radiation Therapy External beam radiation therapy is a radiation treatment. It may be done to:  Treat cancer. The radiation may be used to:  Destroy cancer cells. Radiation delivered during the treatment damages cancer cells. It also damages normal cells, but normal cells have the DNA to repair themselves while cancer cells do not.  Help with symptoms of your cancer.  Stop the growth of any remaining cancer cells after surgery.  Prevent cancer cells from growing in areas that do not have evidence of cancer (prophylactic radiation therapy).  Treat or shrink a tumor.  Reduce pain (palliative therapy). The therapy delivers higher doses of radiation than X-rays, CT scans, and most other imaging tests. Compared with internal radiation therapy, external beam radiation therapy can deliver radiation to a fairly large area.  The amount of radiation you will receive and the length of therapy depends on your medical condition. You should not feel the radiation being delivered or any pain during your therapy. RISKS AND COMPLICATIONS Most people experience side effects from the therapy. Side effects depend on the amount of radiation and the part of your body exposed to radiation. For example:  Hair loss may occur if the radiation therapy is directed to your head.  Coughing or difficulty swallowing may occur if the radiation therapy is directed to your head, neck, or chest.  Nausea, vomiting, or diarrhea may occur if the radiation therapy is directed to your abdomen or pelvis.  Bladder problems, frequent urination, or sexual dysfunction may occur if the radiation therapy is directed to your bladder, kidney, or prostate. Regardless of the amount or location of the radiation, you will probably have fatigue. Other side effects may include:  Red, flaking skin in the affected area.  Hair loss in the affected area.  Itching in the affected area. Side effects may take 2-3 weeks to develop. Most side  effects are temporary and can be controlled. Once the therapy is complete, side effects will not stop right away. It can take up to 3-4 weeks for you to regain your energy or for side effects to lessen. Your body does heal from the radiation. BEFORE THE PROCEDURE There will be a planning session (simulation). During the session:  Your health care provider will plan exactly where the radiation will be delivered (treatment field).  You will be positioned for your therapy. The goal is to have a position that can be reproduced for each therapy session.  Temporary marks may be drawn on your body. Permanent marks may also be drawn on your body in order for you to be positioned the same way for each therapy session. PROCEDURE  You will either lie on a table or sit in a chair in the position determined for your therapy.  The radiation machine (linear accelerator) will move around you to deliver the radiation in exact doses from many angles. AFTER THE PROCEDURE You may return to your normal schedule including diet, activities, and medicines, unless your health care provider tells you otherwise. Document Released: 08/23/2008 Document Revised: 08/21/2013 Document Reviewed: 03/15/2013 [facility] Patient Information 2015 [place], [place]. This information is not intended to replace advice given to you by your health care provider. Make sure you discuss any questions you have with your health care provider.

## 2014-07-10 NOTE — Discharge Summary (Signed)
Physician Discharge Summary  Dustin Freeman:[id number] DOB: Dec 13, 1955 DOA: 06/20/2014  PCP: No PCP Per Patient  Admit date: 06/20/2014 Discharge date: 07/10/2014  Recommendations for Outpatient Follow-up:  1. Please follow-up with oncology and radiation oncology per scheduled appointment. 2. Continue Decadron as prescribed once a day regimen. Please follow-up with oncology and radiation oncology if you need Decadron more than it was prescribed. Prescription for Decadron provided, 4 mg once a day for 10 days. Once he's completed 10 days he can continue 2 mg once a day for additional 5 days and then he can stop unless radiation oncology recommends different. 3. Continue fluconazole for 2 weeks. 4. Radiation treatment per radiation oncology.  Discharge Diagnoses:  Active Problems:   HCAP (healthcare-associated pneumonia)   Hypoxia   Mass of lower lobe of right lung   Pleural effusion   Hilar enlargement   Postobstructive pneumonia   Hypovolemic shock   Other specified hypotension   Hyponatremia   Hypokalemia   Hypocalcemia   Panlobular emphysema   S/P thoracentesis   Blood poisoning   Cancer   Squamous cell carcinoma of right lung   Status post thoracentesis   Severe malnutrition   Lung cancer, main bronchus   Primary cancer of right lower lobe of lung   Anemia of chronic disease    Discharge Condition: stable   Diet recommendation: as tolerated   History of present illness:   59 y.o. male with a PMH of DM type II and PVD s/p stent placement right external iliac artery, brought to [facility] ED 06/20/14 for dizziness, cough, diarrhea. Hospitalized 3 weeks prior for syncope and underwent stent placement in occluded right external iliac artery. Ever since being discharged had diarrhea. On this admission, patient also reported weight loss of about 10 pounds and he reported history of smoking half pack per day for 48 years only recently quit. On this admission, workup revealed a  large right infrahilar mass with suspicious right hilar and subcarinal adenopathy and postobstructive process, subtotal right lower lung collapse. In addition he was noted to have diffuse infiltrative process and bilateral pleural effusions and he underwent thoracentesis twice since this admission. 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 Oncology and radiation oncology on board. PAC placed 3/18 and chemo started 07/06/2014 (carboplatin and etoposide). Radiation treatment per radiation oncology.   Assessment/Plan:    Principal problem:  Extensive RLL, RML, and partial RUL postobustructive pneumonia versus healthcare associated pneumonia / bilateral pleural effusion - Patient was started on broad-spectrum antibiotics on admission for treatment of postobstructive pneumonia. All antibiotics now stopped. Please refer to anti-infectives section below about the duration of specific antibiotics. - Patient does not need antibiotics other than fluconazole on discharge. - Status post thoracentesis on 06/25/2014 with 1.2 L fluid drained. Cytology was non diagnostic. Thoracentesis repeated 06/30/14 with 600 cc fluid drained.  Active Problem: Large soft tissue mass in the right hilum / Squamous cell carcinoma  - S/P bronchoscopy 06/28/2014 which revealed squamous cell carcinoma. MRI of the brain is negative for metastasis. Bone scan with no bony metastasis. - PAC placed 07/06/2014 - Chemo with carboplatin and etoposide 07/06/2014. - Radiation treatment per radiation oncology recommendations.  Fungus on BAL  - Per Dr. Bobby Rumpf (ID), given that right bronchus intermedius was totally occluded would not treat at this time. - Continue fluconazole 100 mg daily for total of 2 weeks on discharge.  Bradycardia - Unclear etiology. Spontaneously improved.  -  Asymptomatic.  Hypovolemic Shock / Sepsis secondary to PNA  -  Resolved. - Continue Decadron 4 mg once a day for 10 days. Once complete 10 days then can continue 2 mg once a day for 5 days unless radiation oncology recommends different.  Hyponatremia  - Dehydration versus SIADH from malignancy - Sodium stable.  Mild hypokalemia - Likely from sepsis.  - Repleted   COPD - Respiratory status stable.  Hyperglycemia, steroid-induced - Hemoglobin A1c 6%.  Diarrhea  - Resolved.   Anemia of chronic disease - Secondary to malignancy.  - Hgb stable   PVD s/p stent placement to right external iliac artery 05/15/14 (Dr. Trula Slade) - Plavix was on hold prior to Port-A-Cath placement. Resumed.  Severe protein calorie malnutrition - Due to chronic illness, malignancy.  - Continue Marinol and nutritional supplementation.  - Diet as tolerated   DVT Prophylaxis  - Heparin subQ ordered while patient in hospital.   Code Status: Full.  Family Communication: plan of care discussed with the patient   IV access:  [facility] 07/06/2014  Procedures and diagnostic studies:   MRI brain 07/03/2014 - no evidence of intracranial metastasis  NM whole body bone scan 07/03/2014 - no findings suspicious for bony metastasis  Dg Chest 1 View 06/30/2014 1. Interval reduction/near resolution of left-sided effusion post thoracentesis. No pneumothorax. 2. Interval increase in small to moderate-sized potentially partially loculated effusion with worsening right mid and lower lung heterogeneous/consolidative opacities. Electronically Signed By: Sandi Mariscal M.D. On: 06/30/2014 12:54   US Thoracentesis Asp Pleural Space W/img Guide 06/30/2014 Successful ultrasound-guided left sided thoracentesis yielding 660 cc of pleural fluid.  Ct Abdomen Pelvis W Contrast 06/29/2014 RIGHT infrahilar mass 4.3 x 4.0 cm with suspected RIGHT hilar adenopathy. Emphysematous changes with subtotal collapse of RIGHT lower lobe with slight pulmonary infiltrates question pneumonia  versus lymphangitic tumor. Ascites and diffuse soft tissue edema question anasarca versus hypoproteinemia. No acute intra-abdominal or intrapelvic metastases.   Dg Chest Port 1 View 06/25/2014 Bilateral lower lobe airspace opacities and layering effusions, both worsening on the left since prior study. No pneumothorax.  Ct Chest W Contrast 3/4/2016Severe post obstructive pneumonia right lower lobe with extension of pneumonia into the right middle lobe and right upper lobe. Large soft tissue mass in the right hilum obstructs the bronchus intermedius. Malignancy suspected There are bilateral pleural effusions. There is infiltrate in the left lower lobe which, although dependent in position, is concerning for pneumonia as well. Significant atelectasis is an alternative consideration.  Dg Chest Port 1 View 3/3/2016Continue slight worsening of right basilar pneumonia, possibly postobstructive in etiology based on prior studies.  Dg Chest Port 1 View 3/3/20161. Worsening of infiltrate in the right lung base most consistent with pneumonia. Small areas a cavitation cannot be excluded within this focus of pneumonia. Consider followup chest x-ray or CT chest. 2. Hyperaeration consistent with emphysema.  Dg Abd Acute W/chest 06/20/2014 Right lower lobe consolidation compatible with pneumonia. COPD.    Medical Consultants:  Dr. Burney Gauze, Oncology Dr. Eppie Gibson, Radiation Oncology IR Dr. Bobby Rumpf, ID  IAnti-Infectives:   Cefepime 06/20/14--> 06/21/14 Levaquin 06/20/14--->06/20/14 Vanc 06/20/14--- > 06/22/14 Zosyn 06/22/14---> 07/05/2014  Signed:  Leisa Lenz, MD  [facility] 07/10/2014, 10:27 AM  Pager #: [phone]   Discharge Exam: Filed Vitals:   07/10/14 0450  BP: 133/77  Pulse: 60  Temp: 97.8 F (36.6 C)  Resp: 19   Filed Vitals:   07/09/14 2034 07/09/14 2037 07/10/14 0450 07/10/14 0523  BP: 165/83 171/77  133/77   Pulse: 50  60   Temp: 97.4  F (36.3 C)  97.8 F (36.6 C)   TempSrc: Oral  Oral   Resp: 16  19   Height:      Weight:    48 kg (105 lb 13.1 oz)  SpO2: 99%  99%     General: Pt is alert, not in acute distress Cardiovascular: Regular rate and rhythm, S1/S2 + Respiratory: Clear to auscultation bilaterally, no wheezing, no crackles Abdominal: Soft, non tender, non distended, bowel sounds +, no guarding Extremities: no edema, no cyanosis, pulses palpable  Neuro: Grossly nonfocal  Discharge Instructions  Discharge Instructions    Diet - low sodium heart healthy    Complete by:  As directed      Discharge instructions    Complete by:  As directed   1. Please follow-up with oncology and radiation oncology per scheduled appointment. 2. Continue Decadron as prescribed once a day regimen. Please follow-up with oncology and radiation oncology if you need Decadron more than it was prescribed. Prescription for Decadron provided, 4 mg once a day for 10 days. 3. Continue fluconazole for 2 weeks.     Increase activity slowly    Complete by:  As directed             Medication List    STOP taking these medications        metFORMIN 500 MG tablet  Commonly known as:  GLUCOPHAGE      TAKE these medications        antiseptic oral rinse 0.05 % Liqd solution  Commonly known as:  CPC / CETYLPYRIDINIUM CHLORIDE 0.05%  7 mLs by Mouth Rinse route 2 times daily at 12 noon and 4 pm.     blood glucose meter kit and supplies Kit  - Diagnosis: diabetes mellitus.  - Dispense based on patient and insurance preference. Use up to four times daily as directed. (FOR ICD-9 250.00, 250.01).     chlorhexidine 0.12 % solution  Commonly known as:  PERIDEX  15 mLs by Mouth Rinse route 2 (two) times daily.     clopidogrel 75 MG tablet  Commonly known as:  PLAVIX  Take 1 tablet (75 mg total) by mouth daily.     dexamethasone 4 MG tablet  Commonly known as:  DECADRON  Take 1 tablet (4 mg total) by mouth daily.     dronabinol 5  MG capsule  Commonly known as:  MARINOL  Take 1 capsule (5 mg total) by mouth 2 (two) times daily before lunch and supper.     feeding supplement (ENSURE COMPLETE) Liqd  Take 237 mLs by mouth 3 (three) times daily between meals.     fluconazole 100 MG tablet  Commonly known as:  DIFLUCAN  Take 1 tablet (100 mg total) by mouth daily.     insulin aspart 100 UNIT/ML injection  Commonly known as:  novoLOG  Inject 0-15 Units into the skin 3 (three) times daily with meals.     ipratropium-albuterol 0.5-2.5 (3) MG/3ML Soln  Commonly known as:  DUONEB  Take 3 mLs by nebulization every 6 (six) hours as needed.     LORazepam 1 MG tablet  Commonly known as:  ATIVAN  Take 1 tablet (1 mg total) by mouth every 12 (twelve) hours as needed for anxiety.     nicotine 21 mg/24hr patch  Commonly known as:  NICODERM CQ - dosed in mg/24 hours  Place 1 patch (21 mg total)  onto the skin daily.     oxyCODONE 5 MG immediate release tablet  Commonly known as:  Oxy IR/ROXICODONE  Take 1 tablet (5 mg total) by mouth every 6 (six) hours as needed for moderate pain.     pantoprazole 40 MG tablet  Commonly known as:  PROTONIX  Take 1 tablet (40 mg total) by mouth daily.     pregabalin 75 MG capsule  Commonly known as:  LYRICA  Take 1 capsule (75 mg total) by mouth 2 (two) times daily.     senna-docusate 8.6-50 MG per tablet  Commonly known as:  Senokot-S  Take 2 tablets by mouth at bedtime.          The results of significant diagnostics from this hospitalization (including imaging, microbiology, ancillary and laboratory) are listed below for reference.    Significant Diagnostic Studies: Dg Chest 1 View  06/30/2014   CLINICAL DATA:  Post left-sided thoracentesis  EXAM: CHEST  1 VIEW  COMPARISON:  06/25/2014; 06/21/2014; chest CT -06/22/2014  FINDINGS: Grossly unchanged cardiac silhouette and mediastinal contours post median sternotomy and CABG with persistent partial obscuration of the right  heart border secondary to interval increase in size of small to moderate partially loculated right-sided effusion with associated worsening right mid and lower lung heterogeneous/consolidative opacities.  Interval reduction/near resolution of left-sided effusion post thoracentesis. No pneumothorax. Improved aeration of the left lower lung.  Unchanged bones.  Regional soft tissues appear normal.  IMPRESSION: 1. Interval reduction/near resolution of left-sided effusion post thoracentesis. No pneumothorax. 2. Interval increase in small to moderate-sized potentially partially loculated effusion with worsening right mid and lower lung heterogeneous/consolidative opacities.   Electronically Signed   By: Sandi Mariscal M.D.   On: 06/30/2014 12:54   Ct Chest W Contrast  06/22/2014   CLINICAL DATA:  Peripheral vascular disease cardiac palpitations diabetes with cough and congestion currently, patient smokes, patient is homeless  EXAM: CT CHEST WITH CONTRAST  TECHNIQUE: Multidetector CT imaging of the chest was performed during intravenous contrast administration.  CONTRAST:  27m OMNIPAQUE IOHEXOL 300 MG/ML  SOLN  COMPARISON:  06/21/2014  FINDINGS: There is extensive infiltrate throughout the right lower lobe. The right lower lobe is densely consolidated. Infiltrate extends into the posterior aspects of the right upper lobe and throughout much of the right middle lobe, which is less severely involved the lower lobe. There is abnormal soft tissue in the right hilum measuring about 9 cm anterior to posterior by 3 cm left right seen best on image number 45. This extends from the superior hilum inferiorly to the level of the coronary sinus. This soft tissue is inseparable from the pericardium on the right, and causes mass effect on the bronchus intermedius, which is not visible as a result.  On the left there is mild to moderate deep tendon infiltrate in the lower lobe. There are small bilateral pleural effusions. There is mild  pericardial thickening/ fluid. There is a 10 mm subcarinal lymph node.  There is coronary artery calcification. Minimal aortic calcifications is present. Thoracic inlet is normal. There are no acute musculoskeletal findings.  IMPRESSION: Severe post obstructive pneumonia right lower lobe with extension of pneumonia into the right middle lobe and right upper lobe.  Large soft tissue mass in the right hilum obstructs the bronchus intermedius. Malignancy suspected  There are bilateral pleural effusions. There is infiltrate in the left lower lobe which, although dependent in position, is concerning for pneumonia as well. Significant atelectasis is an alternative consideration.  Electronically Signed   By: Skipper Cliche M.D.   On: 06/22/2014 17:53   Mr Jeri Cos GY Contrast  07/03/2014   CLINICAL DATA:  Lung cancer staging.  EXAM: MRI HEAD WITHOUT AND WITH CONTRAST  TECHNIQUE: Multiplanar, multiecho pulse sequences of the brain and surrounding structures were obtained without and with intravenous contrast.  CONTRAST:  69mL MULTIHANCE GADOBENATE DIMEGLUMINE 529 MG/ML IV SOLN  COMPARISON:  Noncontrast brain MRI 05/14/2014  FINDINGS: Images are mildly degraded by motion artifact.  There is no evidence of acute infarct, intracranial hemorrhage, mass, midline shift, or extra-axial fluid collection. There is mild generalized cerebral atrophy. No significant white matter disease is identified. Mild enlargement of the cisterna magna is again noted. No abnormal enhancement is identified, although very small lesions could potentially be obscured given the motion artifact.  No suspicious osseous lesions are identified. Orbits are unremarkable. Mild mucosal thickening is present in the ethmoid air cells, maxillary sinuses, and right sphenoid sinus. There are moderately large bilateral mastoid effusions. Major intracranial vascular flow voids are preserved with the distal right vertebral artery again appearing hypoplastic.   IMPRESSION: 1. No evidence of intracranial metastases. 2. Bilateral mastoid effusions.   Electronically Signed   By: Logan Bores   On: 07/03/2014 12:17   Nm Bone Scan Whole Body  07/03/2014   CLINICAL DATA:  History of lung malignancy  EXAM: NUCLEAR MEDICINE WHOLE BODY BONE SCAN  TECHNIQUE: Whole body anterior and posterior images were obtained approximately 3 hours after intravenous injection of radiopharmaceutical.  RADIOPHARMACEUTICALS:  24.0 mCi Technetium-99 MDP  COMPARISON:  None.  FINDINGS: There is adequate erect uptake of the radiopharmaceutical by the skeleton. Adequate soft tissue clearance and renal activity is noted. There is slight increased activity projecting over the mid upper pole of the left kidney.  Uptake within the calvarium, spine, pelvis, and ribs is within the limits of normal. Uptake over the pectoral and pelvic girdles is normal.  IMPRESSION: There are no findings suspicious for bony metastatic disease.   Electronically Signed   By: David  Martinique   On: 07/03/2014 12:33   Ct Abdomen Pelvis W Contrast  06/29/2014   CLINICAL DATA:  Abnormal chest CT with RIGHT hilar mass suspicious for lung cancer, question metastatic disease, history diabetes, collagen vascular disease, former smoker  EXAM: CT ABDOMEN AND PELVIS WITH CONTRAST  TECHNIQUE: Multidetector CT imaging of the abdomen and pelvis was performed using the standard protocol following bolus administration of intravenous contrast. Sagittal and coronal MPR images reconstructed from axial data set.  CONTRAST:  169mL OMNIPAQUE IOHEXOL 300 MG/ML SOLN IV. Dilute oral contrast.  COMPARISON:  CT chest 06/22/2014  FINDINGS: Bibasilar pleural effusions.  Compressive atelectasis LEFT lower lobe.  Underlying emphysematous changes.  RIGHT infrahilar mass 4.3 x 4.0 cm image 6 with suspected RIGHT hilar adenopathy.  Subtotal collapse of RIGHT lower lobe with extensive interstitial infiltrates in the RIGHT lung, involving the posterior RIGHT  upper lobe and RIGHT lower lobe, question pneumonia versus lymphangitic tumor spread.  Diffuse ascites and scattered edema of soft tissue planes throughout abdomen and pelvis question anasarca or hypoproteinemia.  Scattered atherosclerotic calcifications.  Liver, spleen, pancreas, kidneys, and adrenal glands normal.  Minimally prominent stool in rectum and RIGHT colon.  Stomach and remaining bowel loops otherwise unremarkable for technique.  No mass, adenopathy, free fluid or inflammatory process.  No osseous metastatic lesions.  IMPRESSION: RIGHT infrahilar mass 4.3 x 4.0 cm with suspected RIGHT hilar adenopathy.  Emphysematous changes with subtotal collapse  of RIGHT lower lobe with slight pulmonary infiltrates question pneumonia versus lymphangitic tumor.  Ascites and diffuse soft tissue edema question anasarca versus hypoproteinemia.  No acute intra-abdominal or intrapelvic metastases.   Electronically Signed   By: Lavonia Dana M.D.   On: 06/29/2014 19:53   Ir Fluoro Guide Cv Line Left  07/06/2014   INDICATION: History of right-sided lung cancer. In need of durable intravenous access for the administration of chemotherapy.  EXAM: IMPLANTED PORT A CATH PLACEMENT WITH ULTRASOUND AND FLUOROSCOPIC GUIDANCE  COMPARISON:  Chest CT - 06/22/2014  MEDICATIONS: Ancef 2 gm IV; The antibiotic was administered within an appropriate time interval prior to skin puncture.  ANESTHESIA/SEDATION: Versed 0.5 mg IV; Fentanyl 25 mcg IV;  Total Moderate Sedation Time  Twenty-six  minutes.  CONTRAST:  None  FLUOROSCOPY TIME:  18 seconds (3 mGy)  COMPLICATIONS: None immediate  PROCEDURE: The procedure, risks, benefits, and alternatives were explained to the patient. Questions regarding the procedure were encouraged and answered. The patient understands and consents to the procedure.  Given the patient's history of right-sided perihilar lung cancer, the decision was made to place a left-sided port a catheter. The left neck and chest  were prepped with chlorhexidine in a sterile fashion, and a sterile drape was applied covering the operative field. Maximum barrier sterile technique with sterile gowns and gloves were used for the procedure. A timeout was performed prior to the initiation of the procedure. Local anesthesia was provided with 1% lidocaine with epinephrine.  After creating a small venotomy incision, a micropuncture kit was utilized to access the internal jugular vein under direct, real-time ultrasound guidance. Ultrasound image documentation was performed. The microwire was kinked to measure appropriate catheter length.  A subcutaneous port pocket was then created along the upper chest wall utilizing a combination of sharp and blunt dissection. The pocket was irrigated with sterile saline. A single lumen thin power injectable port was chosen for placement. The 8 Fr catheter was tunneled from the port pocket site to the venotomy incision. The port was placed in the pocket. The external catheter was trimmed to appropriate length. At the venotomy, an 8 Fr peel-away sheath was placed over a guidewire under fluoroscopic guidance. The catheter was then placed through the sheath and the sheath was removed. Final catheter positioning was confirmed and documented with a fluoroscopic spot radiograph. The port was accessed with a Huber needle, aspirated and flushed with heparinized saline.  The venotomy site was closed with an interrupted 4-0 Vicryl suture. The port pocket incision was closed with interrupted 2-0 Vicryl suture and the skin was opposed with a running subcuticular 4-0 Vicryl suture. Dermabond and Steri-strips were applied to both incisions. Dressings were placed. The patient tolerated the procedure well without immediate post procedural complication.  FINDINGS: After catheter placement, the tip lies within the superior cavoatrial junction. The catheter aspirates and flushes normally and is ready for immediate use.  IMPRESSION:  Successful placement of a left internal jugular approach power injectable Port-A-Cath. The catheter is ready for immediate use.   Electronically Signed   By: Sandi Mariscal M.D.   On: 07/06/2014 10:42   Ir US Guide Vasc Access Left  07/06/2014   INDICATION: History of right-sided lung cancer. In need of durable intravenous access for the administration of chemotherapy.  EXAM: IMPLANTED PORT A CATH PLACEMENT WITH ULTRASOUND AND FLUOROSCOPIC GUIDANCE  COMPARISON:  Chest CT - 06/22/2014  MEDICATIONS: Ancef 2 gm IV; The antibiotic was administered within an appropriate time interval  prior to skin puncture.  ANESTHESIA/SEDATION: Versed 0.5 mg IV; Fentanyl 25 mcg IV;  Total Moderate Sedation Time  Twenty-six  minutes.  CONTRAST:  None  FLUOROSCOPY TIME:  18 seconds (3 mGy)  COMPLICATIONS: None immediate  PROCEDURE: The procedure, risks, benefits, and alternatives were explained to the patient. Questions regarding the procedure were encouraged and answered. The patient understands and consents to the procedure.  Given the patient's history of right-sided perihilar lung cancer, the decision was made to place a left-sided port a catheter. The left neck and chest were prepped with chlorhexidine in a sterile fashion, and a sterile drape was applied covering the operative field. Maximum barrier sterile technique with sterile gowns and gloves were used for the procedure. A timeout was performed prior to the initiation of the procedure. Local anesthesia was provided with 1% lidocaine with epinephrine.  After creating a small venotomy incision, a micropuncture kit was utilized to access the internal jugular vein under direct, real-time ultrasound guidance. Ultrasound image documentation was performed. The microwire was kinked to measure appropriate catheter length.  A subcutaneous port pocket was then created along the upper chest wall utilizing a combination of sharp and blunt dissection. The pocket was irrigated with sterile  saline. A single lumen thin power injectable port was chosen for placement. The 8 Fr catheter was tunneled from the port pocket site to the venotomy incision. The port was placed in the pocket. The external catheter was trimmed to appropriate length. At the venotomy, an 8 Fr peel-away sheath was placed over a guidewire under fluoroscopic guidance. The catheter was then placed through the sheath and the sheath was removed. Final catheter positioning was confirmed and documented with a fluoroscopic spot radiograph. The port was accessed with a Huber needle, aspirated and flushed with heparinized saline.  The venotomy site was closed with an interrupted 4-0 Vicryl suture. The port pocket incision was closed with interrupted 2-0 Vicryl suture and the skin was opposed with a running subcuticular 4-0 Vicryl suture. Dermabond and Steri-strips were applied to both incisions. Dressings were placed. The patient tolerated the procedure well without immediate post procedural complication.  FINDINGS: After catheter placement, the tip lies within the superior cavoatrial junction. The catheter aspirates and flushes normally and is ready for immediate use.  IMPRESSION: Successful placement of a left internal jugular approach power injectable Port-A-Cath. The catheter is ready for immediate use.   Electronically Signed   By: Sandi Mariscal M.D.   On: 07/06/2014 10:42   Dg Chest Port 1 View  06/25/2014   CLINICAL DATA:  Post thoracentesis.  EXAM: PORTABLE CHEST - 1 VIEW  COMPARISON:  06/21/2014  FINDINGS: Bilateral lower lobe airspace opacities are noted, increasing on the left since prior study. Suspect layering effusions. , increasing on the left as well since prior study. No pneumothorax. There is a lying superimposed over the left upper lobe which simulates a pneumothorax, but lung markings are noted lateral to this line compatible with artifact.  Heart is borderline in size.  No acute bony abnormality.  IMPRESSION: Bilateral  lower lobe airspace opacities and layering effusions, both worsening on the left since prior study. No pneumothorax.   Electronically Signed   By: Rolm Baptise M.D.   On: 06/25/2014 15:51   Dg Chest Port 1 View  06/21/2014   CLINICAL DATA:  Hypoxia.  Respiratory distress.  Initial encounter.  EXAM: PORTABLE CHEST - 1 VIEW  COMPARISON:  Radiographs ranging from 05/13/2014 through 06/21/2014.  FINDINGS: 1642 hours. There  has been further slight worsening of the right infrahilar airspace disease, most consistent with right middle and lower lobe pneumonia. This could be on a post obstructive basis. There is minimal patchy opacity in the left lower lobe. No edema, pneumothorax or significant pleural effusion identified. The heart size is stable. Multiple telemetry leads overlie the chest.  IMPRESSION: Continue slight worsening of right basilar pneumonia, possibly postobstructive in etiology based on prior studies.   Electronically Signed   By: Richardean Sale M.D.   On: 06/21/2014 16:57   Dg Chest Port 1 View  06/21/2014   CLINICAL DATA:  Hospital acquired pneumonia  EXAM: PORTABLE CHEST - 1 VIEW  COMPARISON:  Chest x-ray of 06/20/2014  FINDINGS: There has been worsening of opacity in the right mid lung and right lung base most consistent with pneumonia. Small lucencies within the right lung base could represent areas of cavitation and followup chest x-ray or CT chest is recommended. The lungs are somewhat hyperaerated suggesting emphysema. The heart is mildly enlarged. No bony abnormality is seen.  IMPRESSION: 1. Worsening of infiltrate in the right lung base most consistent with pneumonia. Small areas a cavitation cannot be excluded within this focus of pneumonia. Consider followup chest x-ray or CT chest. 2. Hyperaeration consistent with emphysema.   Electronically Signed   By: Ivar Drape M.D.   On: 06/21/2014 07:23   Dg Abd Acute W/chest  06/20/2014   CLINICAL DATA:  Right upper quadrant pain, cough for weeks.   EXAM: ACUTE ABDOMEN SERIES (ABDOMEN 2 VIEW & CHEST 1 VIEW)  COMPARISON:  05/13/2014  FINDINGS: New airspace opacity noted in the right lower lobe concerning for pneumonia. Underlying COPD. Heart is normal size. Left lung is clear. No effusions.  Mild diffuse gaseous distention of the colon. No evidence of bowel obstruction. No organomegaly or suspicious calcification.  IMPRESSION: Right lower lobe consolidation compatible with pneumonia.  COPD.   Electronically Signed   By: Rolm Baptise M.D.   On: 06/20/2014 13:11   Dg Abd Portable 1v  07/09/2014   CLINICAL DATA:  Right lower abdominal pain, nausea, vomiting  EXAM: PORTABLE ABDOMEN - 1 VIEW  COMPARISON:  None.  FINDINGS: Moderate amount of stool in the descending colon. There is no bowel dilatation to suggest obstruction. There is no evidence of pneumoperitoneum, portal venous gas or pneumatosis. There are no pathologic calcifications along the expected course of the ureters. There is a right iliac stent graft. The osseous structures are unremarkable.  IMPRESSION: Moderate amount of stool in the descending colon.   Electronically Signed   By: Kathreen Devoid   On: 07/09/2014 18:34   US Thoracentesis Asp Pleural Space W/img Guide  06/30/2014   INDICATION: History of lung cancer, now with symptomatic left-sided pleural effusion. Please perform left-sided thoracentesis for therapeutic and diagnostic purposes.  EXAM: US THORACENTESIS ASP PLEURAL SPACE W/IMG GUIDE  COMPARISON:  Chest CT - 06/22/2014; CT abdomen pelvis - 06/29/2014; chest radiograph - 06/25/2014  MEDICATIONS: None  COMPLICATIONS: None immediate  TECHNIQUE: Informed written consent was obtained from the patient after a discussion of the risks, benefits and alternatives to treatment. A timeout was performed prior to the initiation of the procedure.  Initial ultrasound scanning demonstrates a small to moderate-sized left-sided pleural effusion. The lower chest was prepped and draped in the usual sterile  fashion. 1% lidocaine was used for local anesthesia.  Under direct ultrasound guidance, a 19 gauge, 7-cm, Yueh catheter was introduced. An ultrasound image was saved for documentation  purposes. The thoracentesis was performed. The catheter was removed and a dressing was applied. The patient tolerated the procedure well without immediate post procedural complication. The patient was escorted to have an upright chest radiograph.  FINDINGS: A total of approximately 660 cc liters of serous fluid was removed. Requested samples were sent to the laboratory.  IMPRESSION: Successful ultrasound-guided left sided thoracentesis yielding 660 cc of pleural fluid.   Electronically Signed   By: Sandi Mariscal M.D.   On: 06/30/2014 13:27    Microbiology: No results found for this or any previous visit (from the past 240 hour(s)).   Labs: Basic Metabolic Panel:  Recent Labs Lab 07/04/14 0435 07/08/14 0535  NA 133* 134*  K 3.8 4.3  CL 99 97  CO2 27 27  GLUCOSE 143* 139*  BUN 18 24*  CREATININE 0.58 0.43*  CALCIUM 8.2* 8.6   Liver Function Tests:  Recent Labs Lab 07/04/14 0435  AST 19  ALT 30  ALKPHOS 63  BILITOT 0.5  PROT 5.5*  ALBUMIN 2.5*   No results for input(s): LIPASE, AMYLASE in the last 168 hours. No results for input(s): AMMONIA in the last 168 hours. CBC:  Recent Labs Lab 07/04/14 0435 07/08/14 0535  WBC 9.5 11.2*  HGB 11.5* 12.4*  HCT 33.2* 37.8*  MCV 90.0 93.6  PLT 378 352   Cardiac Enzymes: No results for input(s): CKTOTAL, CKMB, CKMBINDEX, TROPONINI in the last 168 hours. BNP: BNP (last 3 results) No results for input(s): BNP in the last 8760 hours.  ProBNP (last 3 results) No results for input(s): PROBNP in the last 8760 hours.  CBG:  Recent Labs Lab 07/09/14 0732 07/09/14 1150 07/09/14 1732 07/09/14 2133 07/10/14 0912  GLUCAP 136* 160* 146* 152* 97    Time coordinating discharge: Over 30 minutes

## 2014-07-10 NOTE — Progress Notes (Signed)
Patient is set to discharge to [facility] today. CSW received message from [place], Nathaniel Man that patient has been approved to go to [facility] when ready through a "Difficult to Place" bed. Patient & wife at bedside aware. Discharge packet given to RN, Gregary Signs. [facility] called for transport.    Clinical Social Work Department CLINICAL SOCIAL WORK PLACEMENT NOTE 07/10/2014  Patient:  Dustin Freeman, Dustin Freeman  Account Number:  [id number] [place] date:  06/20/2014  Clinical Social Worker:  Maryln Manuel  Date/time:  07/05/2014 12:45 PM  Clinical Social Work is seeking post-discharge placement for this patient at the following level of care:   SKILLED NURSING   (*CSW will update this form in Epic as items are completed)   07/05/2014  Patient/family provided with [place] Department of Clinical Social Work's list of facilities offering this level of care within the geographic area requested by the patient (or if unable, by the patient's family).  07/05/2014  Patient/family informed of their freedom to choose among providers that offer the needed level of care, that participate in Medicare, Medicaid or managed care program needed by the patient, have an available bed and are willing to accept the patient.  07/05/2014  Patient/family informed of [facility]' ownership interest in [facility], as well as of the fact that they are under no obligation to receive care at this facility.  PASARR submitted to EDS on 07/05/2014 PASARR number received on 07/05/2014  FL2 transmitted to all facilities in geographic area requested by pt/family on  07/05/2014 FL2 transmitted to all facilities within larger geographic area on   Patient informed that his/her managed care company has contracts with or will negotiate with  certain facilities, including the following:     Patient/family informed of bed offers received:  07/10/2014 Patient chooses bed at [facility] Physician recommends and patient chooses bed at    Patient to be transferred to [place] on  07/10/2014 Patient to be transferred to facility by [facility] Patient and family notified of transfer on 07/10/2014 Name of family member notified:  patient's wife at bedside  The following physician request were entered in Epic:   Additional Comments: Pt will be Letter of Guarantee (LOG).    Raynaldo Opitz, [facility] Clinical Social Worker cell #: [phone]

## 2014-07-11 ENCOUNTER — Telehealth: Payer: Self-pay | Admitting: *Deleted

## 2014-07-11 ENCOUNTER — Ambulatory Visit
Admit: 2014-07-11 | Discharge: 2014-07-11 | Disposition: A | Discharge status: Home / Self Care | Payer: Medicaid Other | Attending: Radiation Oncology | Admitting: Radiation Oncology

## 2014-07-11 DIAGNOSIS — C3431 Malignant neoplasm of lower lobe, right bronchus or lung: Secondary | ICD-10-CM | POA: Diagnosis not present

## 2014-07-11 DIAGNOSIS — Z87891 Personal history of nicotine dependence: Secondary | ICD-10-CM | POA: Diagnosis not present

## 2014-07-11 NOTE — Telephone Encounter (Signed)
Patient was d/c yesterday to [facility] and [place], [phone]. Spoke with Randell Patient, a nurse at [facility]. She states the patient "is on the books for transportation" to his treatments.  Spoke with Kristi, RT on linac 1 and informed her. She states the patient was given his treatment calendar yesterday.

## 2014-07-12 ENCOUNTER — Ambulatory Visit
Admit: 2014-07-12 | Discharge: 2014-07-12 | Disposition: A | Discharge status: Home / Self Care | Payer: Medicaid Other | Attending: Radiation Oncology | Admitting: Radiation Oncology

## 2014-07-12 DIAGNOSIS — C3431 Malignant neoplasm of lower lobe, right bronchus or lung: Secondary | ICD-10-CM | POA: Diagnosis not present

## 2014-07-13 ENCOUNTER — Ambulatory Visit
Admit: 2014-07-13 | Discharge: 2014-07-13 | Disposition: A | Discharge status: Home / Self Care | Payer: Medicaid Other | Attending: Radiation Oncology | Admitting: Radiation Oncology

## 2014-07-13 DIAGNOSIS — C3431 Malignant neoplasm of lower lobe, right bronchus or lung: Secondary | ICD-10-CM | POA: Diagnosis not present

## 2014-07-16 ENCOUNTER — Ambulatory Visit
Admission: RE | Admit: 2014-07-16 | Discharge: 2014-07-16 | Disposition: A | Discharge status: Home / Self Care | Payer: MEDICAID | Source: Ambulatory Visit | Attending: Radiation Oncology | Admitting: Radiation Oncology

## 2014-07-16 ENCOUNTER — Encounter: Payer: Self-pay | Admitting: Radiation Oncology

## 2014-07-16 ENCOUNTER — Ambulatory Visit
Admission: RE | Admit: 2014-07-16 | Discharge: 2014-07-16 | Disposition: A | Discharge status: Home / Self Care | Payer: Medicaid Other | Source: Ambulatory Visit | Attending: Radiation Oncology | Admitting: Radiation Oncology

## 2014-07-16 ENCOUNTER — Ambulatory Visit: Payer: MEDICAID | Admitting: Radiation Oncology

## 2014-07-16 VITALS — BP 121/81 | HR 105 | Temp 98.1°F | Resp 20

## 2014-07-16 DIAGNOSIS — C3431 Malignant neoplasm of lower lobe, right bronchus or lung: Secondary | ICD-10-CM | POA: Diagnosis not present

## 2014-07-16 DIAGNOSIS — C3491 Malignant neoplasm of unspecified part of right bronchus or lung: Secondary | ICD-10-CM

## 2014-07-16 NOTE — Progress Notes (Addendum)
Patient continues to reside at [facility]. He states he is doing physical therapy there. He states "I am eating everything in sight." He states he drinks a nutritional supplement nightly, but his MAR from [employer] he is receiving Ensure three times daily. He refuses to weigh today. He states "I am doing everything I can to get stronger." he denies pain, cough, has SOB with minimal activity.  BP 121/81 mmHg  Pulse 105  Temp(Src) 98.1 F (36.7 C) (Oral)  Resp 20  SpO2 97%

## 2014-07-16 NOTE — Progress Notes (Signed)
Patient did not show for treatment this morning. Spoke with [place]. She stated they have him scheduled to arrive today for a 3 pm appointment. Reviewed patient's entire radiation schedule with her. She verbalized that his transportation is scheduled correctly for the remainder of his appointments. Informed her to have patient arrive today as they have scheduled, at 3 pm, and this RN will inform therapists. Spoke with Raquel Sarna, RT, [facility] and informed. She verbalized understanding.

## 2014-07-16 NOTE — Progress Notes (Signed)
   Weekly Management Note:  Outpatient   Current Dose:  12 Gy  Projected Dose: 60 Gy   Narrative:  The patient presents for routine under treatment assessment.  CBCT/MVCT images/Port film x-rays were reviewed.  The chart was checked.  Eating well but losing weight Saw nutritionist at [facility]  Physical Findings:  Wt Readings from Last 3 Encounters:  07/10/14 105 lb 13.1 oz (48 kg)  05/16/14 116 lb 14.1 oz (53.016 kg)    oral temperature is 98.1 F (36.7 C). His blood pressure is 121/81 and his pulse is 105. His respiration is 20 and oxygen saturation is 97%.  NAD, in wheelchair. Lungs - decreased sounds throughout  CBC    Component Value Date/Time   WBC 11.2* 07/08/2014 0535   RBC 4.04* 07/08/2014 0535   HGB 12.4* 07/08/2014 0535   HCT 37.8* 07/08/2014 0535   PLT 352 07/08/2014 0535   MCV 93.6 07/08/2014 0535   MCH 30.7 07/08/2014 0535   MCHC 32.8 07/08/2014 0535   RDW 19.9* 07/08/2014 0535   LYMPHSABS 1.9 07/02/2014 0328   MONOABS 0.5 07/02/2014 0328   EOSABS 0.0 07/02/2014 0328   BASOSABS 0.0 07/02/2014 0328     CMP     Component Value Date/Time   NA 134* 07/08/2014 0535   K 4.3 07/08/2014 0535   CL 97 07/08/2014 0535   CO2 27 07/08/2014 0535   GLUCOSE 139* 07/08/2014 0535   BUN 24* 07/08/2014 0535   CREATININE 0.43* 07/08/2014 0535   CALCIUM 8.6 07/08/2014 0535   PROT 5.5* 07/04/2014 0435   ALBUMIN 2.5* 07/04/2014 0435   AST 19 07/04/2014 0435   ALT 30 07/04/2014 0435   ALKPHOS 63 07/04/2014 0435   BILITOT 0.5 07/04/2014 0435   GFRNONAA >90 07/08/2014 0535   GFRAA >90 07/08/2014 0535     Impression:  The patient is tolerating radiotherapy.   Plan:  Continue radiotherapy as planned. Note written to [facility] re: continued importance of PO intake. -----------------------------------  Eppie Gibson, MD

## 2014-07-17 ENCOUNTER — Encounter: Payer: Self-pay | Admitting: Internal Medicine

## 2014-07-17 ENCOUNTER — Ambulatory Visit
Admission: RE | Admit: 2014-07-17 | Discharge: 2014-07-17 | Disposition: A | Discharge status: Home / Self Care | Payer: Medicaid Other | Source: Ambulatory Visit | Attending: Radiation Oncology | Admitting: Radiation Oncology

## 2014-07-17 ENCOUNTER — Non-Acute Institutional Stay (SKILLED_NURSING_FACILITY): Payer: Medicaid Other | Admitting: Internal Medicine

## 2014-07-17 DIAGNOSIS — R739 Hyperglycemia, unspecified: Secondary | ICD-10-CM

## 2014-07-17 DIAGNOSIS — E43 Unspecified severe protein-calorie malnutrition: Secondary | ICD-10-CM | POA: Diagnosis not present

## 2014-07-17 DIAGNOSIS — C3491 Malignant neoplasm of unspecified part of right bronchus or lung: Secondary | ICD-10-CM | POA: Diagnosis not present

## 2014-07-17 DIAGNOSIS — E871 Hypo-osmolality and hyponatremia: Secondary | ICD-10-CM

## 2014-07-17 DIAGNOSIS — R001 Bradycardia, unspecified: Secondary | ICD-10-CM | POA: Insufficient documentation

## 2014-07-17 DIAGNOSIS — J449 Chronic obstructive pulmonary disease, unspecified: Secondary | ICD-10-CM | POA: Insufficient documentation

## 2014-07-17 DIAGNOSIS — J439 Emphysema, unspecified: Secondary | ICD-10-CM | POA: Diagnosis not present

## 2014-07-17 DIAGNOSIS — R197 Diarrhea, unspecified: Secondary | ICD-10-CM | POA: Insufficient documentation

## 2014-07-17 DIAGNOSIS — D638 Anemia in other chronic diseases classified elsewhere: Secondary | ICD-10-CM | POA: Diagnosis not present

## 2014-07-17 DIAGNOSIS — J189 Pneumonia, unspecified organism: Secondary | ICD-10-CM

## 2014-07-17 DIAGNOSIS — I739 Peripheral vascular disease, unspecified: Secondary | ICD-10-CM | POA: Insufficient documentation

## 2014-07-17 DIAGNOSIS — C3431 Malignant neoplasm of lower lobe, right bronchus or lung: Secondary | ICD-10-CM | POA: Diagnosis not present

## 2014-07-17 DIAGNOSIS — R6521 Severe sepsis with septic shock: Secondary | ICD-10-CM

## 2014-07-17 DIAGNOSIS — E876 Hypokalemia: Secondary | ICD-10-CM

## 2014-07-17 DIAGNOSIS — B49 Unspecified mycosis: Secondary | ICD-10-CM | POA: Insufficient documentation

## 2014-07-17 DIAGNOSIS — A419 Sepsis, unspecified organism: Secondary | ICD-10-CM

## 2014-07-17 NOTE — Assessment & Plan Note (Addendum)
In lung ;Fungus on BAL   Per Dr. Bobby Rumpf (ID), given that right bronchus intermedius was totally occluded would not treat at this time. - Continue fluconazole 100 mg daily for total of 2 weeks on discharge.

## 2014-07-17 NOTE — Assessment & Plan Note (Signed)
Secondary to malignancy.  - Hgb stable

## 2014-07-17 NOTE — Assessment & Plan Note (Signed)
2/2 shock;repleated

## 2014-07-17 NOTE — Assessment & Plan Note (Signed)
Unclear etiology. Spontaneously improved.  - Asymptomatic.

## 2014-07-17 NOTE — Assessment & Plan Note (Signed)
Resolved. - Continue Decadron 4 mg once a day for 10 days. Once complete 10 days then can continue 2 mg once a day for 5 days unless radiation oncology recommends different.

## 2014-07-17 NOTE — Assessment & Plan Note (Signed)
Due to chronic illness, malignancy.  - Continue Marinol and nutritional supplementation.  - Diet as tolerated

## 2014-07-17 NOTE — Progress Notes (Signed)
MRN: [id number] Name: Dustin Freeman  Sex: male Age: 59 y.o. DOB: 03-30-1956  [place] #: [facility] Facility/Room: Level Of Care: SNF Provider: Inocencio Homes D Emergency Contacts: Extended Emergency Contact Information Primary Emergency Contact: Gwenyth [place] Phone: [phone] Relation: Spouse  Code Status:   Allergies: Review of patient's allergies indicates no known allergies.  Chief Complaint  Patient presents with  . New Admit To SNF    HPI: Patient is 59 y.o. male who is admitted to SNF for generalized weakness for OT/PT with a new dx of lung CA,s/p post obstructive PNA.  Past Medical History  Diagnosis Date  . History of palpitations     During [employer] intake exam, not since  . DM (diabetes mellitus)   . PVD (peripheral vascular disease)     s/p stent to right external iliac artery (05/15/14 - Dr. Donnetta Hutching)  . Collagen vascular disease   . Cancer     Past Surgical History  Procedure Laterality Date  . Lower extremity angiogram N/A 05/15/2014    Procedure: LOWER EXTREMITY ANGIOGRAM;  Surgeon: Serafina Mitchell, MD;  Location: [facility] CATH LAB;  Service: Cardiovascular;  Laterality: N/A;  . Video bronchoscopy Bilateral 06/28/2014    Procedure: VIDEO BRONCHOSCOPY WITHOUT FLUORO;  Surgeon: Rush Farmer, MD;  Location: [facility] ENDOSCOPY;  Service: Endoscopy;  Laterality: Bilateral;      Medication List       This list is accurate as of: 07/17/14 11:59 PM.  Always use your most recent med list.               antiseptic oral rinse 0.05 % Liqd solution  Commonly known as:  CPC / CETYLPYRIDINIUM CHLORIDE 0.05%  7 mLs by Mouth Rinse route 2 times daily at 12 noon and 4 pm.     blood glucose meter kit and supplies Kit  - Diagnosis: diabetes mellitus.  - Dispense based on patient and insurance preference. Use up to four times daily as directed. (FOR ICD-9 250.00, 250.01).     chlorhexidine 0.12 % solution  Commonly known as:  PERIDEX  15 mLs by  Mouth Rinse route 2 (two) times daily.     clopidogrel 75 MG tablet  Commonly known as:  PLAVIX  Take 1 tablet (75 mg total) by mouth daily.     dexamethasone 4 MG tablet  Commonly known as:  DECADRON  Take 1 tablet (4 mg total) by mouth daily.     dronabinol 5 MG capsule  Commonly known as:  MARINOL  Take 1 capsule (5 mg total) by mouth 2 (two) times daily before lunch and supper.     feeding supplement (ENSURE COMPLETE) Liqd  Take 237 mLs by mouth 3 (three) times daily between meals.     fluconazole 100 MG tablet  Commonly known as:  DIFLUCAN  Take 1 tablet (100 mg total) by mouth daily.     insulin aspart 100 UNIT/ML injection  Commonly known as:  novoLOG  Inject 0-15 Units into the skin 3 (three) times daily with meals.     ipratropium-albuterol 0.5-2.5 (3) MG/3ML Soln  Commonly known as:  DUONEB  Take 3 mLs by nebulization every 6 (six) hours as needed.     LORazepam 1 MG tablet  Commonly known as:  ATIVAN  Take 1 tablet (1 mg total) by mouth every 12 (twelve) hours as needed for anxiety.     nicotine 21 mg/24hr patch  Commonly known as:  NICODERM CQ -  dosed in mg/24 hours  Place 1 patch (21 mg total) onto the skin daily.     oxyCODONE 5 MG immediate release tablet  Commonly known as:  Oxy IR/ROXICODONE  Take 1 tablet (5 mg total) by mouth every 6 (six) hours as needed for moderate pain.     pantoprazole 40 MG tablet  Commonly known as:  PROTONIX  Take 1 tablet (40 mg total) by mouth daily.     pregabalin 75 MG capsule  Commonly known as:  LYRICA  Take 1 capsule (75 mg total) by mouth 2 (two) times daily.     senna-docusate 8.6-50 MG per tablet  Commonly known as:  Senokot-S  Take 2 tablets by mouth at bedtime.        No orders of the defined types were placed in this encounter.    Immunization History  Administered Date(s) Administered  . Influenza,inj,Quad PF,36+ Mos 05/14/2014  . Pneumococcal Polysaccharide-23 05/14/2014    History  Substance  Use Topics  . Smoking status: Former Smoker -- 0.50 packs/day for 47 years    Types: Cigarettes    Quit date: 05/22/2014  . Smokeless tobacco: Not on file  . Alcohol Use: No    Family history is noncontributory    Review of Systems  DATA OBTAINED: from patient, nurse GENERAL:  no fevers, fatigue, appetite changes SKIN: No itching, rash EYES: No eye pain, redness, discharge EARS: No earache, tinnitus, change in hearing NOSE: No congestion, drainage or bleeding  MOUTH/THROAT: No mouth or tooth pain, No sore throat RESPIRATORY: No cough, wheezing, some SOB, some CP CARDIAC: No chest pain, palpitations, lower extremity edema  GI: No abdominal pain, No N/V/D or constipation, No heartburn or reflux  GU: No dysuria, frequency or urgency, or incontinence  MUSCULOSKELETAL: No unrelieved bone/joint pain NEUROLOGIC: No headache, dizziness or focal weakness PSYCHIATRIC: No overt anxiety or sadness, No behavior issue.   Filed Vitals:   07/17/14 1314  BP: 129/78  Pulse: 110  Temp: 97.4 F (36.3 C)  Resp: 16    Physical Exam  GENERAL APPEARANCE: Alert, conversant,  No acute distress, thin WM SKIN: No diaphoresis rash HEAD: Normocephalic, atraumatic  EYES: Conjunctiva/lids clear. Pupils round, reactive. EOMs intact.  EARS: External exam WNL, canals clear. Hearing grossly normal.  NOSE: No deformity or discharge.  MOUTH/THROAT: Lips w/o lesions  RESPIRATORY: Breathing is even, unlabored. Lung sounds are diffusely decreased   CARDIOVASCULAR: Heart RRR no murmurs, rubs or gallops. No peripheral edema.   GASTROINTESTINAL: Abdomen is soft, non-tender, not distended w/ normal bowel sounds. GENITOURINARY: Bladder non tender, not distended  MUSCULOSKELETAL: No abnormal joints or musculature NEUROLOGIC:  Cranial nerves 2-12 grossly intact. Moves all extremities  PSYCHIATRIC: Mood and affect appropriate to situation, no behavioral issues  Patient Active Problem List   Diagnosis Date Noted   . Deep fungus infection 07/17/2014  . Bradycardia 07/17/2014  . COPD (chronic obstructive pulmonary disease) 07/17/2014  . PVD (peripheral vascular disease) 07/17/2014  . Diarrhea 07/17/2014  . Primary cancer of right lower lobe of lung 07/04/2014  . Anemia of chronic disease   . Lung cancer, main bronchus   . Status post thoracentesis   . Severe malnutrition   . Squamous cell carcinoma of right lung   . Cancer   . S/P thoracentesis   . Septic shock   . Hilar enlargement   . Postobstructive pneumonia   . Hypovolemic shock   . Other specified hypotension   . Hyponatremia   . Hypokalemia   .  Hypocalcemia   . Panlobular emphysema   . Mass of lower lobe of right lung 06/23/2014  . Pleural effusion 06/23/2014  . Hypoxia   . HCAP (healthcare-associated pneumonia) 06/20/2014  . Protein-calorie malnutrition, severe 05/15/2014  . Syncope 05/13/2014  . Leukocytosis 05/13/2014  . Hyperglycemia 05/13/2014  . Anemia 05/13/2014  . Syncope and collapse 05/13/2014    CBC    Component Value Date/Time   WBC 11.2* 07/08/2014 0535   RBC 4.04* 07/08/2014 0535   HGB 12.4* 07/08/2014 0535   HCT 37.8* 07/08/2014 0535   PLT 352 07/08/2014 0535   MCV 93.6 07/08/2014 0535   LYMPHSABS 1.9 07/02/2014 0328   MONOABS 0.5 07/02/2014 0328   EOSABS 0.0 07/02/2014 0328   BASOSABS 0.0 07/02/2014 0328    CMP     Component Value Date/Time   NA 134* 07/08/2014 0535   K 4.3 07/08/2014 0535   CL 97 07/08/2014 0535   CO2 27 07/08/2014 0535   GLUCOSE 139* 07/08/2014 0535   BUN 24* 07/08/2014 0535   CREATININE 0.43* 07/08/2014 0535   CALCIUM 8.6 07/08/2014 0535   PROT 5.5* 07/04/2014 0435   ALBUMIN 2.5* 07/04/2014 0435   AST 19 07/04/2014 0435   ALT 30 07/04/2014 0435   ALKPHOS 63 07/04/2014 0435   BILITOT 0.5 07/04/2014 0435   GFRNONAA >90 07/08/2014 0535   GFRAA >90 07/08/2014 0535    Assessment and Plan  Postobstructive pneumonia Extensive RLL, RML, and partial RUL postobustructive  pneumonia versus healthcare associated pneumonia / bilateral pleural effusion - Patient was started on broad-spectrum antibiotics on admission for treatment of postobstructive pneumonia. All antibiotics now stopped. Please refer to anti-infectives section below about the duration of specific antibiotics. - Patient does not need antibiotics other than fluconazole on discharge. - Status post thoracentesis on 06/25/2014 with 1.2 L fluid drained. Cytology was non diagnostic. Thoracentesis repeated 06/30/14 with 600 cc fluid drained.   Squamous cell carcinoma of right lung Large soft tissue mass in the right hilum / Squamous cell carcinoma  - S/P bronchoscopy 06/28/2014 which revealed squamous cell carcinoma. MRI of the brain is negative for metastasis. Bone scan with no bony metastasis. - PAC placed 07/06/2014 - Chemo with carboplatin and etoposide 07/06/2014. - Radiation treatment per radiation oncology recommendations   Deep fungus infection In lung ;Fungus on BAL   Per Dr. Bobby Rumpf (ID), given that right bronchus intermedius was totally occluded would not treat at this time. - Continue fluconazole 100 mg daily for total of 2 weeks on discharge.    Bradycardia Unclear etiology. Spontaneously improved.  - Asymptomatic.   Septic shock Resolved. - Continue Decadron 4 mg once a day for 10 days. Once complete 10 days then can continue 2 mg once a day for 5 days unless radiation oncology recommends different.   Hyponatremia Dehydration versus SIADH from malignancy - Sodium stable.    Hypokalemia 2/2 shock;repleated   COPD (chronic obstructive pulmonary disease) Chronic and stable   Anemia of chronic disease Secondary to malignancy.  - Hgb stable    PVD (peripheral vascular disease) s/p stent placement to right external iliac artery 05/15/14 (Dr. Trula Slade) - Plavix was on hold prior to Port-A-Cath placement. Resumed.   Protein-calorie malnutrition, severe Due to  chronic illness, malignancy.  - Continue Marinol and nutritional supplementation.  - Diet as tolerated    Hyperglycemia A1c 6.0;steroid induced   Diarrhea Resolved     Hennie Duos, MD

## 2014-07-17 NOTE — Assessment & Plan Note (Signed)
A1c 6.0;steroid induced

## 2014-07-17 NOTE — Assessment & Plan Note (Signed)
Chronic and stable.   

## 2014-07-17 NOTE — Assessment & Plan Note (Signed)
s/p stent placement to right external iliac artery 05/15/14 (Dr. Trula Slade) - Plavix was on hold prior to Port-A-Cath placement. Resumed.

## 2014-07-17 NOTE — Assessment & Plan Note (Signed)
Large soft tissue mass in the right hilum / Squamous cell carcinoma  - S/P bronchoscopy 06/28/2014 which revealed squamous cell carcinoma. MRI of the brain is negative for metastasis. Bone scan with no bony metastasis. - PAC placed 07/06/2014 - Chemo with carboplatin and etoposide 07/06/2014. - Radiation treatment per radiation oncology recommendations

## 2014-07-17 NOTE — Assessment & Plan Note (Signed)
Extensive RLL, RML, and partial RUL postobustructive pneumonia versus healthcare associated pneumonia / bilateral pleural effusion - Patient was started on broad-spectrum antibiotics on admission for treatment of postobstructive pneumonia. All antibiotics now stopped. Please refer to anti-infectives section below about the duration of specific antibiotics. - Patient does not need antibiotics other than fluconazole on discharge. - Status post thoracentesis on 06/25/2014 with 1.2 L fluid drained. Cytology was non diagnostic. Thoracentesis repeated 06/30/14 with 600 cc fluid drained.

## 2014-07-17 NOTE — Assessment & Plan Note (Signed)
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

## 2014-07-17 NOTE — Assessment & Plan Note (Signed)
Resolved

## 2014-07-18 ENCOUNTER — Ambulatory Visit
Admission: RE | Admit: 2014-07-18 | Discharge: 2014-07-18 | Disposition: A | Discharge status: Home / Self Care | Payer: Medicaid Other | Source: Ambulatory Visit | Attending: Radiation Oncology | Admitting: Radiation Oncology

## 2014-07-18 DIAGNOSIS — C3431 Malignant neoplasm of lower lobe, right bronchus or lung: Secondary | ICD-10-CM | POA: Diagnosis not present

## 2014-07-19 ENCOUNTER — Ambulatory Visit
Admission: RE | Admit: 2014-07-19 | Discharge: 2014-07-19 | Disposition: A | Discharge status: Home / Self Care | Payer: Medicaid Other | Source: Ambulatory Visit | Attending: Radiation Oncology | Admitting: Radiation Oncology

## 2014-07-19 DIAGNOSIS — C3431 Malignant neoplasm of lower lobe, right bronchus or lung: Secondary | ICD-10-CM | POA: Diagnosis not present

## 2014-07-20 ENCOUNTER — Ambulatory Visit
Admit: 2014-07-20 | Discharge: 2014-07-20 | Disposition: A | Discharge status: Home / Self Care | Payer: Medicaid Other | Attending: Radiation Oncology | Admitting: Radiation Oncology

## 2014-07-20 DIAGNOSIS — C3431 Malignant neoplasm of lower lobe, right bronchus or lung: Secondary | ICD-10-CM | POA: Diagnosis not present

## 2014-07-22 ENCOUNTER — Encounter: Payer: Self-pay | Admitting: Internal Medicine

## 2014-07-22 LAB — FUNGUS CULTURE W SMEAR
Fungal Smear: NONE SEEN
Special Requests: NORMAL

## 2014-07-23 ENCOUNTER — Ambulatory Visit
Admit: 2014-07-23 | Discharge: 2014-07-23 | Disposition: A | Discharge status: Home / Self Care | Payer: Medicaid Other | Attending: Radiation Oncology | Admitting: Radiation Oncology

## 2014-07-23 ENCOUNTER — Ambulatory Visit
Admission: RE | Admit: 2014-07-23 | Discharge: 2014-07-23 | Disposition: A | Discharge status: Home / Self Care | Payer: Self-pay | Source: Ambulatory Visit | Attending: Radiation Oncology | Admitting: Radiation Oncology

## 2014-07-23 DIAGNOSIS — C3431 Malignant neoplasm of lower lobe, right bronchus or lung: Secondary | ICD-10-CM | POA: Diagnosis not present

## 2014-07-23 NOTE — Addendum Note (Signed)
Encounter addended by: Minna Antis, RN on: 07/23/2014  4:16 PM<BR>     Documentation filed: Notes Section

## 2014-07-23 NOTE — Progress Notes (Signed)
   Weekly Management Note:  Outpatient   Current Dose:  22 Gy  Projected Dose: 60 Gy   Narrative:  The patient presents for routine under treatment assessment.  CBCT/MVCT images/Port film x-rays were reviewed.  The chart was checked.  Eating well and good appetite. Declines vitals due to rush to catch his ride.  Denies esophagitis Still at [facility]  Physical Findings:  Wt Readings from Last 3 Encounters:  07/10/14 105 lb 13.1 oz (48 kg)  05/16/14 116 lb 14.1 oz (53.016 kg)    vitals were not taken for this visit. NAD, in wheelchair. No skin irritation over back  CBC    Component Value Date/Time   WBC 11.2* 07/08/2014 0535   RBC 4.04* 07/08/2014 0535   HGB 12.4* 07/08/2014 0535   HCT 37.8* 07/08/2014 0535   PLT 352 07/08/2014 0535   MCV 93.6 07/08/2014 0535   MCH 30.7 07/08/2014 0535   MCHC 32.8 07/08/2014 0535   RDW 19.9* 07/08/2014 0535   LYMPHSABS 1.9 07/02/2014 0328   MONOABS 0.5 07/02/2014 0328   EOSABS 0.0 07/02/2014 0328   BASOSABS 0.0 07/02/2014 0328     CMP     Component Value Date/Time   NA 134* 07/08/2014 0535   K 4.3 07/08/2014 0535   CL 97 07/08/2014 0535   CO2 27 07/08/2014 0535   GLUCOSE 139* 07/08/2014 0535   BUN 24* 07/08/2014 0535   CREATININE 0.43* 07/08/2014 0535   CALCIUM 8.6 07/08/2014 0535   PROT 5.5* 07/04/2014 0435   ALBUMIN 2.5* 07/04/2014 0435   AST 19 07/04/2014 0435   ALT 30 07/04/2014 0435   ALKPHOS 63 07/04/2014 0435   BILITOT 0.5 07/04/2014 0435   GFRNONAA >90 07/08/2014 0535   GFRAA >90 07/08/2014 0535     Impression:  The patient is tolerating radiotherapy.   Plan:  Continue radiotherapy as planned.   -----------------------------------  Eppie Gibson, MD

## 2014-07-23 NOTE — Progress Notes (Signed)
Patient seen in treatment area by Dr Isidore Moos. Nursing assessment not completed.

## 2014-07-24 ENCOUNTER — Ambulatory Visit
Admit: 2014-07-24 | Discharge: 2014-07-24 | Disposition: A | Discharge status: Home / Self Care | Payer: Medicaid Other | Attending: Radiation Oncology | Admitting: Radiation Oncology

## 2014-07-24 DIAGNOSIS — C3431 Malignant neoplasm of lower lobe, right bronchus or lung: Secondary | ICD-10-CM | POA: Diagnosis not present

## 2014-07-25 ENCOUNTER — Ambulatory Visit
Admit: 2014-07-25 | Discharge: 2014-07-25 | Disposition: A | Discharge status: Home / Self Care | Payer: Medicaid Other | Attending: Radiation Oncology | Admitting: Radiation Oncology

## 2014-07-25 DIAGNOSIS — C3431 Malignant neoplasm of lower lobe, right bronchus or lung: Secondary | ICD-10-CM | POA: Diagnosis not present

## 2014-07-26 ENCOUNTER — Ambulatory Visit
Admit: 2014-07-26 | Discharge: 2014-07-26 | Disposition: A | Discharge status: Home / Self Care | Payer: Medicaid Other | Attending: Radiation Oncology | Admitting: Radiation Oncology

## 2014-07-26 DIAGNOSIS — C3431 Malignant neoplasm of lower lobe, right bronchus or lung: Secondary | ICD-10-CM | POA: Diagnosis not present

## 2014-07-27 ENCOUNTER — Ambulatory Visit
Admit: 2014-07-27 | Discharge: 2014-07-27 | Disposition: A | Discharge status: Home / Self Care | Payer: Medicaid Other | Attending: Radiation Oncology | Admitting: Radiation Oncology

## 2014-07-27 DIAGNOSIS — C3431 Malignant neoplasm of lower lobe, right bronchus or lung: Secondary | ICD-10-CM | POA: Diagnosis not present

## 2014-07-30 ENCOUNTER — Ambulatory Visit
Admit: 2014-07-30 | Discharge: 2014-07-30 | Disposition: A | Discharge status: Home / Self Care | Payer: Medicaid Other | Attending: Radiation Oncology | Admitting: Radiation Oncology

## 2014-07-30 ENCOUNTER — Ambulatory Visit
Admission: RE | Admit: 2014-07-30 | Discharge: 2014-07-30 | Disposition: A | Discharge status: Home / Self Care | Payer: Self-pay | Source: Ambulatory Visit | Attending: Radiation Oncology | Admitting: Radiation Oncology

## 2014-07-30 ENCOUNTER — Encounter: Payer: Self-pay | Admitting: Radiation Oncology

## 2014-07-30 VITALS — BP 96/68 | HR 116 | Temp 98.5°F | Resp 22 | Wt 115.3 lb

## 2014-07-30 DIAGNOSIS — C3431 Malignant neoplasm of lower lobe, right bronchus or lung: Secondary | ICD-10-CM

## 2014-07-30 LAB — PREALBUMIN: PREALBUMIN: 28 mg/dL (ref 21–43)

## 2014-07-30 NOTE — Progress Notes (Signed)
Patient continues to reside at [facility]. He denies pain, fatigue, loss of appetite. He has productive cough with clear sputum, SOB with minimal activity.  He states he is still doing PT, gaining strength. He has fine red rash in inner forearms, elbows which he states he noticed 2 days ago. He states he called it to the attention of his care giver at [facility]. He states they are unsure as to the cause.  BP 96/68 mmHg  Pulse 116  Temp(Src) 98.5 F (36.9 C) (Oral)  Resp 22  Wt 115 lb 4.8 oz (52.3 kg)  SpO2 97%

## 2014-07-30 NOTE — Progress Notes (Signed)
   Weekly Management Note:  Outpatient   Current Dose:  32 Gy  Projected Dose: 60 Gy   Narrative:  The patient presents for routine under treatment assessment.  CBCT/MVCT images/Port film x-rays were reviewed.  The chart was checked. No new c/o r/t RT  Physical Findings:  Wt Readings from Last 3 Encounters:  07/10/14 105 lb 13.1 oz (48 kg)  05/16/14 116 lb 14.1 oz (53.016 kg)    weight is 115 lb 4.8 oz (52.3 kg). His oral temperature is 98.5 F (36.9 C). His blood pressure is 96/68 and his pulse is 116. His respiration is 22 and oxygen saturation is 97%.  NAD, in wheelchair. Skin diffusely dry over back (stable)  CBC    Component Value Date/Time   WBC 11.2* 07/08/2014 0535   RBC 4.04* 07/08/2014 0535   HGB 12.4* 07/08/2014 0535   HCT 37.8* 07/08/2014 0535   PLT 352 07/08/2014 0535   MCV 93.6 07/08/2014 0535   MCH 30.7 07/08/2014 0535   MCHC 32.8 07/08/2014 0535   RDW 19.9* 07/08/2014 0535   LYMPHSABS 1.9 07/02/2014 0328   MONOABS 0.5 07/02/2014 0328   EOSABS 0.0 07/02/2014 0328   BASOSABS 0.0 07/02/2014 0328     CMP     Component Value Date/Time   NA 134* 07/08/2014 0535   K 4.3 07/08/2014 0535   CL 97 07/08/2014 0535   CO2 27 07/08/2014 0535   GLUCOSE 139* 07/08/2014 0535   BUN 24* 07/08/2014 0535   CREATININE 0.43* 07/08/2014 0535   CALCIUM 8.6 07/08/2014 0535   PROT 5.5* 07/04/2014 0435   ALBUMIN 2.5* 07/04/2014 0435   AST 19 07/04/2014 0435   ALT 30 07/04/2014 0435   ALKPHOS 63 07/04/2014 0435   BILITOT 0.5 07/04/2014 0435   GFRNONAA >90 07/08/2014 0535   GFRAA >90 07/08/2014 0535     Impression:  The patient is tolerating radiotherapy.   Plan:  Continue radiotherapy as planned.   -----------------------------------  Eppie Gibson, MD

## 2014-07-31 ENCOUNTER — Other Ambulatory Visit: Payer: Self-pay | Admitting: *Deleted

## 2014-07-31 ENCOUNTER — Ambulatory Visit
Admit: 2014-07-31 | Discharge: 2014-07-31 | Disposition: A | Discharge status: Home / Self Care | Payer: Medicaid Other | Attending: Radiation Oncology | Admitting: Radiation Oncology

## 2014-07-31 ENCOUNTER — Other Ambulatory Visit: Payer: Self-pay | Admitting: Hematology & Oncology

## 2014-07-31 ENCOUNTER — Telehealth: Payer: Self-pay | Admitting: Hematology & Oncology

## 2014-07-31 DIAGNOSIS — C3431 Malignant neoplasm of lower lobe, right bronchus or lung: Secondary | ICD-10-CM | POA: Diagnosis not present

## 2014-07-31 DIAGNOSIS — C801 Malignant (primary) neoplasm, unspecified: Secondary | ICD-10-CM

## 2014-07-31 DIAGNOSIS — C3491 Malignant neoplasm of unspecified part of right bronchus or lung: Secondary | ICD-10-CM

## 2014-07-31 NOTE — Telephone Encounter (Signed)
Pt aware of 5-2 appointment per MD ok to schedule after rad onc tx. When I was giving wife directions she said they were homeless. She stated she should be getting a retirement check soon. She had lots of questions and wants to speak to MD. Pt's address is the soup kitchen in [place] but they are in a hotel for the moment. Roselyn Reef RN aware I transferred call to her. I suggested to RN maybe she could reach out to SW. Wife said no one talked to them when pt was in hospital.

## 2014-08-01 ENCOUNTER — Ambulatory Visit
Admit: 2014-08-01 | Discharge: 2014-08-01 | Disposition: A | Discharge status: Home / Self Care | Payer: Medicaid Other | Attending: Radiation Oncology | Admitting: Radiation Oncology

## 2014-08-01 ENCOUNTER — Encounter: Payer: Self-pay | Admitting: *Deleted

## 2014-08-01 DIAGNOSIS — C3431 Malignant neoplasm of lower lobe, right bronchus or lung: Secondary | ICD-10-CM | POA: Diagnosis not present

## 2014-08-01 NOTE — Progress Notes (Signed)
Dustin Freeman  Clinical Social Freeman was referred by nurse for assessment of psychosocial needs due to homelessness.  Clinical Social Worker contacted wife, Velva Harman via phone to offer support and assess for needs. Wife currently on the bus and could not talk in detail over the phone. CSW explained role of CSW and how CSW team could assist. Wife currently in a motel in [place] and transportation is a Dietitian to care. Per chart review, Pt was living in a tent prior to his lengthy hospitalization. Current address is listed as the [facility] Pt wife to return CSW call in the am to discuss needs in more detail. Pt preparing to d/c from SNF soon and will need appropriate D/C plan in place. CSW to meet with pt at a radonc appt later this week as well.   Clinical Social Freeman interventions: Supportive listening REsource education  Loren Racer, [place] Worker [place]  [place] Phone: [phone] Fax: [phone]

## 2014-08-02 ENCOUNTER — Ambulatory Visit
Admit: 2014-08-02 | Discharge: 2014-08-02 | Disposition: A | Discharge status: Home / Self Care | Payer: Medicaid Other | Attending: Radiation Oncology | Admitting: Radiation Oncology

## 2014-08-02 DIAGNOSIS — C3431 Malignant neoplasm of lower lobe, right bronchus or lung: Secondary | ICD-10-CM | POA: Diagnosis not present

## 2014-08-03 ENCOUNTER — Encounter: Payer: Self-pay | Admitting: *Deleted

## 2014-08-03 ENCOUNTER — Ambulatory Visit
Admit: 2014-08-03 | Discharge: 2014-08-03 | Disposition: A | Discharge status: Home / Self Care | Payer: Medicaid Other | Attending: Radiation Oncology | Admitting: Radiation Oncology

## 2014-08-03 DIAGNOSIS — C3431 Malignant neoplasm of lower lobe, right bronchus or lung: Secondary | ICD-10-CM | POA: Diagnosis not present

## 2014-08-03 NOTE — Progress Notes (Signed)
[place] Work  Clinical Social Work was to meet Pt after radiation, but he came earlier than CSW had expected.   Clinical Education officer, museum following for resources and support, CSW to try again next week to offer support and assess for needs.    Loren Racer, [place] Worker [place]  [place] Phone: [phone] Fax: [phone]

## 2014-08-03 NOTE — Progress Notes (Signed)
[place] Clinical Social Work  Clinical Social Work working with [employer] to assist pt with ss disability application. CSW to meet with pt today after radiation to further discuss social concerns, transportation and housing after SNF d/c are most concerning currently.   Clinical Social Work interventions: Resource coordination  Loren Racer, [place] Worker [place]  [place] Phone: [phone] Fax: [phone]

## 2014-08-06 ENCOUNTER — Ambulatory Visit
Admit: 2014-08-06 | Discharge: 2014-08-06 | Disposition: A | Discharge status: Home / Self Care | Payer: Medicaid Other | Attending: Radiation Oncology | Admitting: Radiation Oncology

## 2014-08-06 ENCOUNTER — Encounter: Payer: Self-pay | Admitting: Radiation Oncology

## 2014-08-06 ENCOUNTER — Ambulatory Visit
Admission: RE | Admit: 2014-08-06 | Discharge: 2014-08-06 | Disposition: A | Discharge status: Home / Self Care | Payer: MEDICAID | Source: Ambulatory Visit | Attending: Radiation Oncology | Admitting: Radiation Oncology

## 2014-08-06 VITALS — BP 102/81 | HR 121 | Temp 97.8°F | Resp 16 | Wt 115.6 lb

## 2014-08-06 DIAGNOSIS — C3431 Malignant neoplasm of lower lobe, right bronchus or lung: Secondary | ICD-10-CM | POA: Diagnosis not present

## 2014-08-06 MED ORDER — RADIAPLEXRX EX GEL
Freq: Once | CUTANEOUS | Status: AC
  Administered 2014-08-06: 18:00:00 via TOPICAL

## 2014-08-06 NOTE — Progress Notes (Signed)
   Weekly Management Note:  Outpatient   Current Dose:  42 Gy  Projected Dose: 60 Gy   Narrative:  The patient presents for routine under treatment assessment.  CBCT/MVCT images/Port film x-rays were reviewed.  The chart was checked. No new c/o r/t RT  Physical Findings:  Wt Readings from Last 3 Encounters:  07/10/14 105 lb 13.1 oz (48 kg)  05/16/14 116 lb 14.1 oz (53.016 kg)    weight is 115 lb 9.6 oz (52.436 kg). His oral temperature is 97.8 F (36.6 C). His blood pressure is 102/81 and his pulse is 121. His respiration is 16 and oxygen saturation is 97%.  NAD, in wheelchair. Skin erythema over back   CBC    Component Value Date/Time   WBC 11.2* 07/08/2014 0535   RBC 4.04* 07/08/2014 0535   HGB 12.4* 07/08/2014 0535   HCT 37.8* 07/08/2014 0535   PLT 352 07/08/2014 0535   MCV 93.6 07/08/2014 0535   MCH 30.7 07/08/2014 0535   MCHC 32.8 07/08/2014 0535   RDW 19.9* 07/08/2014 0535   LYMPHSABS 1.9 07/02/2014 0328   MONOABS 0.5 07/02/2014 0328   EOSABS 0.0 07/02/2014 0328   BASOSABS 0.0 07/02/2014 0328     CMP     Component Value Date/Time   NA 134* 07/08/2014 0535   K 4.3 07/08/2014 0535   CL 97 07/08/2014 0535   CO2 27 07/08/2014 0535   GLUCOSE 139* 07/08/2014 0535   BUN 24* 07/08/2014 0535   CREATININE 0.43* 07/08/2014 0535   CALCIUM 8.6 07/08/2014 0535   PROT 5.5* 07/04/2014 0435   ALBUMIN 2.5* 07/04/2014 0435   AST 19 07/04/2014 0435   ALT 30 07/04/2014 0435   ALKPHOS 63 07/04/2014 0435   BILITOT 0.5 07/04/2014 0435   GFRNONAA >90 07/08/2014 0535   GFRAA >90 07/08/2014 0535    Impression:  The patient is tolerating radiotherapy.   Plan:  Continue radiotherapy as planned.  Radiaplex for skin irritation -----------------------------------  Eppie Gibson, MD

## 2014-08-06 NOTE — Progress Notes (Signed)
Dustin Freeman [phone] transportation to notify him the patient is ready to be picked up.

## 2014-08-06 NOTE — Addendum Note (Signed)
Encounter addended by: Jenene Slicker, RN on: 08/06/2014  5:49 PM<BR>     Documentation filed: Dx [employer], Inpatient MAR, Orders

## 2014-08-06 NOTE — Progress Notes (Signed)
He is currently in no pain.  Pt complains of fatigue and weakness. Shortness of Breath  Walking and Coughing  Productive and Color of Phlegm  white. Pt is on room air. Pt reports skin is warm dry and intact. Pt continues with smoking patch.  Pt denies dysphagia. The patient eats a regular, healthy diet. Pt reports drinking 2-3 ensure a day.  He reports having diarrhea everyday, he notices it more when he overeats.  Denies rectal irritation or bleeding.  BP 102/81 mmHg  Pulse 121  Temp(Src) 97.8 F (36.6 C) (Oral)  Resp 16  Wt 115 lb 9.6 oz (52.436 kg)  SpO2 97%

## 2014-08-07 ENCOUNTER — Ambulatory Visit
Admit: 2014-08-07 | Discharge: 2014-08-07 | Disposition: A | Discharge status: Home / Self Care | Payer: Medicaid Other | Attending: Radiation Oncology | Admitting: Radiation Oncology

## 2014-08-07 DIAGNOSIS — C3431 Malignant neoplasm of lower lobe, right bronchus or lung: Secondary | ICD-10-CM | POA: Diagnosis not present

## 2014-08-08 ENCOUNTER — Ambulatory Visit
Admit: 2014-08-08 | Discharge: 2014-08-08 | Disposition: A | Discharge status: Home / Self Care | Payer: Medicaid Other | Attending: Radiation Oncology | Admitting: Radiation Oncology

## 2014-08-08 DIAGNOSIS — C3431 Malignant neoplasm of lower lobe, right bronchus or lung: Secondary | ICD-10-CM | POA: Diagnosis not present

## 2014-08-08 LAB — AFB CULTURE WITH SMEAR (NOT AT ARMC)
Acid Fast Smear: NONE SEEN
SPECIAL REQUESTS: NORMAL

## 2014-08-09 ENCOUNTER — Ambulatory Visit: Payer: Medicaid Other

## 2014-08-09 ENCOUNTER — Ambulatory Visit
Admission: RE | Admit: 2014-08-09 | Discharge: 2014-08-09 | Disposition: A | Discharge status: Home / Self Care | Payer: Medicaid Other | Source: Ambulatory Visit | Attending: Radiation Oncology | Admitting: Radiation Oncology

## 2014-08-09 ENCOUNTER — Encounter: Payer: Self-pay | Admitting: *Deleted

## 2014-08-09 NOTE — Progress Notes (Signed)
[place] Work  Clinical Social Work phoned wife, Velva Harman for assessment of psychosocial needs.  CSW and wife reviewed current situation and resource issues. Per wife, she is living at the [facility]. Pt is due to be released from [facility] next week. CSW inquired if they had the means to cook and prepare meals. Per wife, they have a fridge and microwave. She was told by SNF that [facility] would be set up prior to his d/c. Pt now does want to go to treatment in [place], per wife. Transportation is a barrier to treatment as pt and wife rely on the bus to get around. Pt would qualify for [facility]AT, but SCAT will only service the [place] area. Per SCAT, pt would need an MD in [place].  CSW reviewed transportation barriers and lack of resources to get him to [place]. CSW inquired about other needs and plans. Wife then stated she needed to get off the phone as she was running out of minutes and would call CSW back on the motel phone. Wife returned call. She appears to understand transportation issues. CSW attempting to arrange a time to meet with wife and pt when he is here for treatment. Wife to call CSW back to confirm date and time for next week.   Clinical Social Work interventions: Resource education - Loren Racer, [place] Worker [place]  [place] Phone: [phone] Fax: [phone]

## 2014-08-10 ENCOUNTER — Ambulatory Visit
Admit: 2014-08-10 | Discharge: 2014-08-10 | Disposition: A | Discharge status: Home / Self Care | Payer: Medicaid Other | Attending: Radiation Oncology | Admitting: Radiation Oncology

## 2014-08-10 DIAGNOSIS — C3431 Malignant neoplasm of lower lobe, right bronchus or lung: Secondary | ICD-10-CM | POA: Diagnosis not present

## 2014-08-11 LAB — AFB CULTURE WITH SMEAR (NOT AT ARMC)
Acid Fast Smear: NONE SEEN
SPECIAL REQUESTS: NORMAL

## 2014-08-13 ENCOUNTER — Encounter: Payer: Self-pay | Admitting: Radiation Oncology

## 2014-08-13 ENCOUNTER — Ambulatory Visit
Admission: RE | Admit: 2014-08-13 | Discharge: 2014-08-13 | Disposition: A | Discharge status: Home / Self Care | Payer: Medicaid Other | Source: Ambulatory Visit | Attending: Radiation Oncology | Admitting: Radiation Oncology

## 2014-08-13 ENCOUNTER — Ambulatory Visit
Admission: RE | Admit: 2014-08-13 | Discharge: 2014-08-13 | Disposition: A | Discharge status: Home / Self Care | Payer: MEDICAID | Source: Ambulatory Visit | Attending: Radiation Oncology | Admitting: Radiation Oncology

## 2014-08-13 VITALS — BP 104/74 | HR 124 | Temp 97.4°F | Resp 16 | Wt 122.8 lb

## 2014-08-13 DIAGNOSIS — C3431 Malignant neoplasm of lower lobe, right bronchus or lung: Secondary | ICD-10-CM

## 2014-08-13 NOTE — Progress Notes (Signed)
He is currently in no pain.  Pt complains of Shortness of Breath  Walking and Coughing -Dry. Pt is on room air. Noted mild erythema - back.  He continues to use Radiaplex as directed.  He reports the radiaplex is helping tremendously with relieving skin issues on his back. Pt denies dysphagia. The patient eats a regular, healthy diet. Soft.  Pt reports drinking Ensure PO tid. BP 104/74 mmHg  Pulse 124  Temp(Src) 97.4 F (36.3 C) (Oral)  Resp 16  Wt 122 lb 12.8 oz (55.702 kg)  SpO2 97%

## 2014-08-13 NOTE — Progress Notes (Signed)
   Weekly Management Note:  Outpatient    ICD-9-CM ICD-10-CM   1. Primary cancer of right lower lobe of lung 162.5 C34.31     Current Dose:  50 Gy  Projected Dose: 60 Gy   Narrative:  The patient presents for routine under treatment assessment.  CBCT/MVCT images/Port film x-rays were reviewed.The chart was checked. Noted mild erythema - back.  He continues to use Radiaplex as directed.  He reports the radiaplex is helping tremendously with relieving skin issues on his back.   Pt notes eating well and good energy.  Gaining weight  Physical Findings:  weight is 122 lb 12.8 oz (55.702 kg). His oral temperature is 97.4 F (36.3 C). His blood pressure is 104/74 and his pulse is 124. His respiration is 16 and oxygen saturation is 97%.   Wt Readings from Last 3 Encounters:  07/10/14 105 lb 13.1 oz (48 kg)  05/16/14 116 lb 14.1 oz (53.016 kg)   Vitals with BMI 08/13/2014 08/13/2014  Height    Weight  122 lbs 13 oz  BMI    Systolic 242 353  Diastolic 74 72  Pulse 614 113  Respirations  16   Heart slightly tachycardic and regular rhythm  Lungs clear to auscultation bilaterally Skin: patch of erythema due to radiation   Impression:  The patient is tolerating radiotherapy.  Plan:  Continue radiotherapy as planned. Anticipate final treatment on May 2nd - sees med/onc in early May  ________________________________   Eppie Gibson, M.D.   This document serves as a record of services personally performed by Eppie Gibson, MD. It was created on her behalf by Derek Mound, a trained medical scribe. The creation of this record is based on the scribe's personal observations and the provider's statements to them. This document has been checked and approved by the attending provider.

## 2014-08-14 ENCOUNTER — Ambulatory Visit
Admit: 2014-08-14 | Discharge: 2014-08-14 | Disposition: A | Discharge status: Home / Self Care | Payer: Medicaid Other | Attending: Radiation Oncology | Admitting: Radiation Oncology

## 2014-08-14 DIAGNOSIS — C3431 Malignant neoplasm of lower lobe, right bronchus or lung: Secondary | ICD-10-CM | POA: Diagnosis not present

## 2014-08-15 ENCOUNTER — Ambulatory Visit
Admit: 2014-08-15 | Discharge: 2014-08-15 | Disposition: A | Discharge status: Home / Self Care | Payer: Medicaid Other | Attending: Radiation Oncology | Admitting: Radiation Oncology

## 2014-08-15 DIAGNOSIS — C3431 Malignant neoplasm of lower lobe, right bronchus or lung: Secondary | ICD-10-CM | POA: Diagnosis not present

## 2014-08-16 ENCOUNTER — Ambulatory Visit
Admit: 2014-08-16 | Discharge: 2014-08-16 | Disposition: A | Discharge status: Home / Self Care | Payer: Medicaid Other | Attending: Radiation Oncology | Admitting: Radiation Oncology

## 2014-08-16 DIAGNOSIS — C3431 Malignant neoplasm of lower lobe, right bronchus or lung: Secondary | ICD-10-CM | POA: Diagnosis not present

## 2014-08-17 ENCOUNTER — Ambulatory Visit: Payer: Medicaid Other

## 2014-08-17 ENCOUNTER — Ambulatory Visit
Admission: RE | Admit: 2014-08-17 | Discharge: 2014-08-17 | Disposition: A | Discharge status: Home / Self Care | Payer: Medicaid Other | Source: Ambulatory Visit | Attending: Radiation Oncology | Admitting: Radiation Oncology

## 2014-08-17 DIAGNOSIS — C3431 Malignant neoplasm of lower lobe, right bronchus or lung: Secondary | ICD-10-CM | POA: Diagnosis not present

## 2014-08-20 ENCOUNTER — Ambulatory Visit
Admission: RE | Admit: 2014-08-20 | Discharge: 2014-08-20 | Disposition: A | Discharge status: Home / Self Care | Payer: Medicaid Other | Source: Ambulatory Visit | Attending: Radiation Oncology | Admitting: Radiation Oncology

## 2014-08-20 ENCOUNTER — Non-Acute Institutional Stay (SKILLED_NURSING_FACILITY): Payer: Medicaid Other | Admitting: Internal Medicine

## 2014-08-20 ENCOUNTER — Ambulatory Visit: Payer: Medicaid Other

## 2014-08-20 ENCOUNTER — Encounter: Payer: Self-pay | Admitting: Radiation Oncology

## 2014-08-20 ENCOUNTER — Ambulatory Visit: Payer: Self-pay | Admitting: Hematology & Oncology

## 2014-08-20 ENCOUNTER — Other Ambulatory Visit: Payer: Self-pay

## 2014-08-20 ENCOUNTER — Ambulatory Visit
Admission: RE | Admit: 2014-08-20 | Discharge: 2014-08-20 | Disposition: A | Discharge status: Home / Self Care | Payer: MEDICAID | Source: Ambulatory Visit | Attending: Radiation Oncology | Admitting: Radiation Oncology

## 2014-08-20 DIAGNOSIS — R6521 Severe sepsis with septic shock: Secondary | ICD-10-CM

## 2014-08-20 DIAGNOSIS — C3431 Malignant neoplasm of lower lobe, right bronchus or lung: Secondary | ICD-10-CM | POA: Diagnosis not present

## 2014-08-20 DIAGNOSIS — A419 Sepsis, unspecified organism: Secondary | ICD-10-CM | POA: Diagnosis not present

## 2014-08-20 DIAGNOSIS — E871 Hypo-osmolality and hyponatremia: Secondary | ICD-10-CM

## 2014-08-20 DIAGNOSIS — J189 Pneumonia, unspecified organism: Secondary | ICD-10-CM

## 2014-08-20 DIAGNOSIS — I739 Peripheral vascular disease, unspecified: Secondary | ICD-10-CM | POA: Diagnosis not present

## 2014-08-20 NOTE — Progress Notes (Signed)
Weekly Management Note:  Site: Right lung, lower lobe Current Dose:  6000  cGy Projected Dose: 6000  cGy  Narrative: The patient is seen today for routine under treatment assessment. CBCT/MVCT images/port films were reviewed. The chart was reviewed.   He finishes his radiation therapy today.  He states he is breathing better.  He is to return home tomorrow from his skilled nursing facility.  His weight is up 15-20 pounds over the past 3-4 weeks.  Physical Examination: There were no vitals filed for this visit..  Weight:  .  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  Impression: Radiation therapy completed, patient improved.  Plan: Follow-up visit with Dr. Isidore Moos one month.

## 2014-08-20 NOTE — Progress Notes (Signed)
Mr. Covin has completed 30 fractions to his RLL.  He denies pain and SOB today.  Using Lotion given with mild redness in tx field.

## 2014-08-21 ENCOUNTER — Other Ambulatory Visit (HOSPITAL_COMMUNITY): Payer: Self-pay

## 2014-08-21 ENCOUNTER — Ambulatory Visit: Payer: Self-pay | Admitting: Vascular Surgery

## 2014-08-21 ENCOUNTER — Encounter (HOSPITAL_COMMUNITY): Payer: Self-pay

## 2014-08-26 ENCOUNTER — Encounter: Payer: Self-pay | Admitting: Internal Medicine

## 2014-08-26 NOTE — Progress Notes (Signed)
Patient ID: EZANA HUBBERT, male   DOB: 03-Feb-1956, 59 y.o.   MRN: [id number] MRN: [id number] Name: Dustin Freeman  Sex: male Age: 59 y.o. DOB: 1956/01/26  [place] #: [facility] Facility/Room: Level Of Care: SNF Provider: Wille Celeste Emergency Contacts: Extended Emergency Contact Information Primary Emergency Contact: Gwenyth [place] Phone: [phone] Relation: Spouse  Code Status:   Allergies: Review of patient's allergies indicates no known allergies.  Chief Complaint  Patient presents with  . Discharge Note    HPI: Patient is 59 y.o. male who is admitted to SNF for generalized weakness for OT/PT with a new dx of lung CA,s/p post obstructive PNA.--Patient has done relatively well here has gained strength he has completed radiation and will be going home he does have a supportive family.  He will need PT and OT for further strengthening as well as nursing support for his medical issues with history of lung carcinoma as well as diabetes.  Blood sugars appear to be well controlled in the 90s to low 100s.--He is on NovoLog sliding scale--has had some history hyperglycemia this was thought to be steroid induced  He has no complaints today appears to have gained significant strength is looking forward to going home    Past Medical History  Diagnosis Date  . History of palpitations     During [employer] intake exam, not since  . DM (diabetes mellitus)   . PVD (peripheral vascular disease)     s/p stent to right external iliac artery (05/15/14 - Dr. Donnetta Hutching)  . Collagen vascular disease   . Cancer     Past Surgical History  Procedure Laterality Date  . Lower extremity angiogram N/A 05/15/2014    Procedure: LOWER EXTREMITY ANGIOGRAM;  Surgeon: Serafina Mitchell, MD;  Location[facility] CATH LAB;  Service: Cardiovascular;  Laterality: N/A;  . Video bronchoscopy Bilateral 06/28/2014    Procedure: VIDEO BRONCHOSCOPY WITHOUT FLUORO;  Surgeon: Rush Farmer, MD;   Location: [facility] ENDOSCOPY;  Service: Endoscopy;  Laterality: Bilateral;      Medication List       This list is accurate as of: 08/20/14 11:59 PM.  Always use your most recent med list.               antiseptic oral rinse 0.05 % Liqd solution  Commonly known as:  CPC / CETYLPYRIDINIUM CHLORIDE 0.05%  7 mLs by Mouth Rinse route 2 times daily at 12 noon and 4 pm.     blood glucose meter kit and supplies Kit  - Diagnosis: diabetes mellitus.  - Dispense based on patient and insurance preference. Use up to four times daily as directed. (FOR ICD-9 250.00, 250.01).     chlorhexidine 0.12 % solution  Commonly known as:  PERIDEX  15 mLs by Mouth Rinse route 2 (two) times daily.     clopidogrel 75 MG tablet  Commonly known as:  PLAVIX  Take 1 tablet (75 mg total) by mouth daily.     dexamethasone 4 MG tablet  Commonly known as:  DECADRON  Take 1 tablet (4 mg total) by mouth daily.     dronabinol 5 MG capsule  Commonly known as:  MARINOL  Take 1 capsule (5 mg total) by mouth 2 (two) times daily before lunch and supper.     feeding supplement (ENSURE COMPLETE) Liqd  Take 237 mLs by mouth 3 (three) times daily between meals.     fluconazole 100 MG tablet  Commonly known  as:  DIFLUCAN  Take 1 tablet (100 mg total) by mouth daily.     insulin aspart 100 UNIT/ML injection  Commonly known as:  novoLOG  Inject 0-15 Units into the skin 3 (three) times daily with meals.     ipratropium-albuterol 0.5-2.5 (3) MG/3ML Soln  Commonly known as:  DUONEB  Take 3 mLs by nebulization every 6 (six) hours as needed.     LORazepam 1 MG tablet  Commonly known as:  ATIVAN  Take 1 tablet (1 mg total) by mouth every 12 (twelve) hours as needed for anxiety.     nicotine 21 mg/24hr patch  Commonly known as:  NICODERM CQ - dosed in mg/24 hours  Place 1 patch (21 mg total) onto the skin daily.     oxyCODONE 5 MG immediate release tablet  Commonly known as:  Oxy IR/ROXICODONE  Take 1 tablet (5 mg  total) by mouth every 6 (six) hours as needed for moderate pain.     pantoprazole 40 MG tablet  Commonly known as:  PROTONIX  Take 1 tablet (40 mg total) by mouth daily.     pregabalin 75 MG capsule  Commonly known as:  LYRICA  Take 1 capsule (75 mg total) by mouth 2 (two) times daily.     senna-docusate 8.6-50 MG per tablet  Commonly known as:  Senokot-S  Take 2 tablets by mouth at bedtime.        No orders of the defined types were placed in this encounter.    Immunization History  Administered Date(s) Administered  . Influenza,inj,Quad PF,36+ Mos 05/14/2014  . Pneumococcal Polysaccharide-23 05/14/2014    History  Substance Use Topics  . Smoking status: Former Smoker -- 0.50 packs/day for 47 years    Types: Cigarettes    Quit date: 05/22/2014  . Smokeless tobacco: Not on file  . Alcohol Use: No    Family history is noncontributory    Review of Systems  DATA OBTAINED: from patient,  GENERAL:  no fevers, fatigue, appetite changes SKIN: No itching, rash EYES: No eye pain, redness, discharge EARS: No earache, tinnitus, change in hearing NOSE: No congestion, drainage or bleeding  MOUTH/THROAT: No mouth or tooth pain, No sore throat RESPIRATORY: No cough, wheezing, some SOB, CARDIAC: No chest pain, palpitations, lower extremity edema  GI: No abdominal pain, No N/V/D or constipation, No heartburn or reflux  GU: No dysuria, frequency or urgency, or incontinence  MUSCULOSKELETAL: No unrelieved bone/joint pain NEUROLOGIC: No headache, dizziness or focal weakness PSYCHIATRIC: No overt anxiety or sadness, No behavior issue.   Filed Vitals:   08/26/14 1836  BP: 112/77  Pulse: 98  Temp: 97.8 F (36.6 C)  Resp: 18    Physical Exam  GENERAL APPEARANCE: Alert, conversant,  No acute distress, thin WM SKIN: No diaphoresis rash HEAD: Normocephalic, atraumatic  EYES: Conjunctiva/lids clear. Pupils round, reactive. EOMs intact.  EARS: External exam WNL, canals clear.  Hearing grossly normal.  NOSE: No deformity or discharge.  MOUTH/THROAT: Lips w/o lesions oropharynx is clear mucous membranes moist RESPIRATORY: Breathing is even, unlabored. Lung sounds are diffusely decreased   CARDIOVASCULAR: Heart RRR no murmurs, rubs or gallops. No peripheral edema.   GASTROINTESTINAL: Abdomen is soft, non-tender, not distended w/ normal bowel sounds. GENITOURINARY: Bladder non tender, not distended  MUSCULOSKELETAL: No abnormal joints or musculature--appears to be ambulating quite well has had significant weakness but this has improved but would benefit from continued therapy evaluation NEUROLOGIC:  Cranial nerves 2-12 grossly intact. Moves all extremities  PSYCHIATRIC: Mood and affect appropriate to situation, no behavioral issues  Patient Active Problem List   Diagnosis Date Noted  . Deep fungus infection 07/17/2014  . Bradycardia 07/17/2014  . COPD (chronic obstructive pulmonary disease) 07/17/2014  . PVD (peripheral vascular disease) 07/17/2014  . Diarrhea 07/17/2014  . Primary cancer of right lower lobe of lung 07/04/2014  . Anemia of chronic disease   . Lung cancer, main bronchus   . Status post thoracentesis   . Severe malnutrition   . Squamous cell carcinoma of right lung   . Cancer   . S/P thoracentesis   . Septic shock   . Hilar enlargement   . Postobstructive pneumonia   . Hypovolemic shock   . Other specified hypotension   . Hyponatremia   . Hypokalemia   . Hypocalcemia   . Panlobular emphysema   . Mass of lower lobe of right lung 06/23/2014  . Pleural effusion 06/23/2014  . Hypoxia   . HCAP (healthcare-associated pneumonia) 06/20/2014  . Protein-calorie malnutrition, severe 05/15/2014  . Syncope 05/13/2014  . Leukocytosis 05/13/2014  . Hyperglycemia 05/13/2014  . Anemia 05/13/2014  . Syncope and collapse 05/13/2014    CBC    Component Value Date/Time   WBC 11.2* 07/08/2014 0535   RBC 4.04* 07/08/2014 0535   HGB 12.4* 07/08/2014  0535   HCT 37.8* 07/08/2014 0535   PLT 352 07/08/2014 0535   MCV 93.6 07/08/2014 0535   LYMPHSABS 1.9 07/02/2014 0328   MONOABS 0.5 07/02/2014 0328   EOSABS 0.0 07/02/2014 0328   BASOSABS 0.0 07/02/2014 0328    CMP     Component Value Date/Time   NA 134* 07/08/2014 0535   K 4.3 07/08/2014 0535   CL 97 07/08/2014 0535   CO2 27 07/08/2014 0535   GLUCOSE 139* 07/08/2014 0535   BUN 24* 07/08/2014 0535   CREATININE 0.43* 07/08/2014 0535   CALCIUM 8.6 07/08/2014 0535   PROT 5.5* 07/04/2014 0435   ALBUMIN 2.5* 07/04/2014 0435   AST 19 07/04/2014 0435   ALT 30 07/04/2014 0435   ALKPHOS 63 07/04/2014 0435   BILITOT 0.5 07/04/2014 0435   GFRNONAA >90 07/08/2014 0535   GFRAA >90 07/08/2014 0535    Assessment and Plan   History postobstructive pneumonia-she is now off antibiotics this appears to be stable.  History of squamous cell carcinoma of the lung status post bronchoscopy-MRI was negative for any brain metastasis bone scan did not show any metastasis he has completed chemotherapy and just completed radiation for now appearss to be gaining strength stable in this regards  History of septic shock-he appears to have made a good recovery from this he has finished his Decadron.  History of hypokalemia as well as hyponatremia we will update labs.  History of anemia chronic disease thought secondary to malignancy will update a CBC for discharge and notify primary care provider of results.  History of fungal infection lungs-this is been evaluated by infectious disease-he has completed a course of Diflucan.  History of peripheral vascular disease status post right external iliac artery his Plavix as been restarted.   patient again will be going home he will need nursing support for his multiple medical issues as well as PT and OT secondary to significant deconditioning-again he appears to be doing well.  [id number] note greater than 30 minutes spent on this discharge  summary-prescriptions have been written  No problem-specific assessment & plan notes found for this encounter.   Kaylyn Garrow C, PA

## 2014-08-27 NOTE — Progress Notes (Signed)
  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      Plan: The patient has completed radiation treatment. The patient will return to radiation oncology clinic for routine followup in one month. I advised them to call or return sooner if they have any questions or concerns related to their recovery or treatment.  -----------------------------------  Eppie Gibson, MD

## 2014-08-29 ENCOUNTER — Other Ambulatory Visit: Payer: Self-pay | Admitting: Hematology & Oncology

## 2014-08-29 DIAGNOSIS — C3491 Malignant neoplasm of unspecified part of right bronchus or lung: Secondary | ICD-10-CM

## 2014-08-30 ENCOUNTER — Telehealth: Payer: Self-pay | Admitting: Hematology & Oncology

## 2014-08-30 NOTE — Telephone Encounter (Signed)
Left pt message to call for appointment °

## 2014-09-04 ENCOUNTER — Telehealth: Payer: Self-pay | Admitting: Hematology & Oncology

## 2014-09-04 NOTE — Telephone Encounter (Signed)
Per Md order for 08/29/14 to sch patient in one week for a hospital follow up visit.  Apt was sch for 09/06/14.  i called patient and did not get answer, so i left message on voice mailed that Dr.E would like for patient to come in for a follow up visit and it was sch for 09/06/14 and to call back to confirm message was received and if apt date and time worked for them

## 2014-09-05 ENCOUNTER — Encounter: Payer: Self-pay | Admitting: *Deleted

## 2014-09-05 ENCOUNTER — Telehealth: Payer: Self-pay | Admitting: Hematology & Oncology

## 2014-09-05 NOTE — Progress Notes (Signed)
[place] Work  Clinical Social Work was referred by Development worker, community for assessment of psychosocial needs due to transportation issues. CSW phoned pt's wife in attempt to address transportation needs. CSW has talked with wife in the past and had explained that there are very limited options to assist with transportation between [place] and [place]. Pt often uses the bus per earlier phone conversations. CSW left message to return CSW call. CSW also left messages for [facility] staff.   Loren Racer, [place] Worker [place]  [place] Phone: [phone] Fax: [phone]

## 2014-09-05 NOTE — Telephone Encounter (Signed)
Per Md orders to sch patient and call and give apt date/time. i called patient yesterday and left message of 09/06/14 apt date and time and to call back to confirm apt.  Patient did not call back, so i call again today and left another message of apt date and time for 09/06/14

## 2014-09-06 ENCOUNTER — Other Ambulatory Visit: Payer: Self-pay

## 2014-09-06 ENCOUNTER — Ambulatory Visit: Payer: Self-pay | Admitting: Family

## 2014-09-19 ENCOUNTER — Encounter: Payer: Self-pay | Admitting: *Deleted

## 2014-09-19 ENCOUNTER — Ambulatory Visit: Payer: Medicaid Other | Attending: Radiation Oncology | Admitting: Radiation Oncology

## 2014-09-19 ENCOUNTER — Ambulatory Visit: Admission: RE | Admit: 2014-09-19 | Payer: MEDICAID | Source: Ambulatory Visit | Admitting: Radiation Oncology

## 2014-09-19 NOTE — Progress Notes (Signed)
Called patients home number to receive no answer, left a messaging stating they had an appointment at 11 am today with Dr. Isidore Moos and to please call [phone] to reschedule.

## 2014-09-20 ENCOUNTER — Ambulatory Visit: Payer: MEDICAID | Admitting: Radiation Oncology

## 2015-05-24 ENCOUNTER — Ambulatory Visit
Admission: RE | Admit: 2015-05-24 | Discharge: 2015-05-24 | Disposition: A | Discharge status: Home / Self Care | Payer: Medicaid Other | Source: Ambulatory Visit | Attending: Radiation Oncology | Admitting: Radiation Oncology

## 2015-05-24 ENCOUNTER — Encounter: Payer: Self-pay | Admitting: Radiation Oncology

## 2015-05-24 VITALS — BP 132/86 | HR 97 | Temp 97.8°F | Ht 69.0 in | Wt 132.6 lb

## 2015-05-24 DIAGNOSIS — Z0000 Encounter for general adult medical examination without abnormal findings: Secondary | ICD-10-CM

## 2015-05-24 DIAGNOSIS — R1111 Vomiting without nausea: Secondary | ICD-10-CM

## 2015-05-24 DIAGNOSIS — J441 Chronic obstructive pulmonary disease with (acute) exacerbation: Secondary | ICD-10-CM

## 2015-05-24 DIAGNOSIS — C3431 Malignant neoplasm of lower lobe, right bronchus or lung: Secondary | ICD-10-CM

## 2015-05-24 NOTE — Progress Notes (Signed)
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 He denies any pain at this time. He does cough when he eats too much because his food will occasionally get stuck in his throat. He throws up after he eats too much, but he is not nauseous. He feels like he is eating well. He has gained weight since his last visit. He has some shortness of breath with activity. His oxygen saturation is 100% on room air today. He does have a chronic dry cough all the time per his report.  ALLERGIES:  has No Known Allergies.  Meds: Current Outpatient Prescriptions  Medication Sig Dispense Refill  . antiseptic oral rinse (CPC / CETYLPYRIDINIUM CHLORIDE 0.05%) 0.05 % LIQD solution 7 mLs by Mouth Rinse route 2 times daily at 12 noon and 4 pm. 44 mL 0  . blood glucose meter kit and supplies KIT Diagnosis: diabetes mellitus. Dispense based on patient and insurance preference. Use up to four times daily as directed. (FOR ICD-9 250.00, 250.01). 1 each 0  . clopidogrel (PLAVIX) 75 MG tablet Take 1 tablet (75 mg total) by mouth daily. 30 tablet 4  . metFORMIN (GLUCOPHAGE) 500 MG tablet Take 500 mg by mouth daily with breakfast.    . pregabalin (LYRICA) 75 MG capsule Take 1 capsule (75 mg total) by mouth 2 (two) times daily. 60 capsule 3  . chlorhexidine (PERIDEX) 0.12 % solution 15 mLs by Mouth Rinse route 2 (two) times daily. (Patient not taking: Reported on 05/24/2015) 120 mL 0  . dexamethasone (DECADRON) 4 MG tablet Take 1 tablet (4 mg total) by mouth daily.  (Patient not taking: Reported on 08/20/2014) 10 tablet 0  . dronabinol (MARINOL) 5 MG capsule Take 1 capsule (5 mg total) by mouth 2 (two) times daily before lunch and supper. (Patient not taking: Reported on 05/24/2015) 30 capsule 0  . feeding supplement, ENSURE COMPLETE, (ENSURE COMPLETE) LIQD Take 237 mLs by mouth 3 (three) times daily between meals. (Patient not taking: Reported on 05/24/2015) 2 Bottle 0  . insulin aspart (NOVOLOG) 100 UNIT/ML injection Inject 0-15 Units into the skin 3 (three) times daily with meals. (Patient not taking: Reported on 05/24/2015) 10 mL 11  . ipratropium-albuterol (DUONEB) 0.5-2.5 (3) MG/3ML SOLN Take 3 mLs by nebulization every 6 (six) hours as needed. (Patient not taking: Reported on 05/24/2015) 360 mL 0  . LORazepam (ATIVAN) 1 MG tablet Take 1 tablet (1 mg total) by mouth every 12 (twelve) hours as needed for anxiety. (Patient not taking: Reported on 05/24/2015) 30 tablet 0  . nicotine (NICODERM CQ - DOSED IN MG/24 HOURS) 21 mg/24hr patch Place 1 patch (21 mg total) onto the skin daily. (Patient not taking: Reported on 05/24/2015) 28 patch 3  . oxyCODONE (OXY IR/ROXICODONE) 5 MG immediate release tablet Take 1 tablet (5 mg total) by mouth every 6 (six) hours as needed for moderate pain. (Patient not taking: Reported on 05/24/2015) 30 tablet 0  . pantoprazole (PROTONIX) 40 MG tablet Take 1 tablet (40 mg total)  by mouth daily. (Patient not taking: Reported on 05/24/2015) 30 tablet 0  . senna-docusate (SENOKOT-S) 8.6-50 MG per tablet Take 2 tablets by mouth at bedtime. (Patient not taking: Reported on 05/24/2015) 30 tablet 0   No current facility-administered medications for this encounter.    Physical Findings: The patient is in no acute distress. Patient is alert and oriented.  height is _0  (1.753 m) and weight is 132 lb 9.6 oz (60.147 kg). His temperature is 97.8 F (36.6 C). His blood pressure is 132/86 and his Freeman is 97. His oxygen saturation is 100%. . General: Alert and  oriented, in no acute distress Neck: Neck is supple, no palpable cervical or supraclavicular lymphadenopathy. Heart: Regular in rate and rhythm with no murmurs, rubs, or gallops. Chest: Clear to auscultation bilaterally, with no rhonchi, wheezes, or rales. Decreased breath sound bilaterally. Abdomen: Soft, nontender, nondistended, with no rigidity or guarding.   Lab Findings: Lab Results  Component Value Date   WBC 11.2* 07/08/2014   HGB 12.4* 07/08/2014   HCT 37.8* 07/08/2014   MCV 93.6 07/08/2014   PLT 352 07/08/2014    Radiographic Findings: No results found.  Impression/Plan: He does not have a PCP currently. I will refer him to a primary care practitioner in [place]. I will also refer him to a gastroenterologist because of the spitting the food back up after eating. I recommend that he makes an appointment with Dr. Marin Olp to get new scans and reevalute any role for chemotherapy. I will contact Dr Marin Olp about this.    I will follow up with him in 6 months. I called his sister, Dustin Freeman, during the visit to update her on this appointment.   Patient and wife know to call if any questions arise.  The patient continues to use tobacco. The patient was counseled to stop using tobacco but is not yet ready to quit.  Encouraged to cut down.  Offered nicotine patches in the future, if he needs them.   I spent 20 minutes face to face with the patient and more than 50% of that time was spent in counseling and/or coordination of care. _____________________________________   Eppie Gibson, MD    This document serves as a record of services personally performed by Eppie Gibson, MD. It was created on her behalf by Lendon Collar, a trained medical scribe. The creation of this record is based on the scribe's personal observations and the provider's statements to them. This document has been checked and approved by the attending provider.

## 2015-05-24 NOTE — Progress Notes (Signed)
Mr. Bressman presents for follow up of radiation completed 08/20/2014 to his Right Lower Lung. He denies any pain at this time. He does cough when he eats too much because his food will occasionally get stuck in his throat. He feels like he is eating well. He has some shortness of breath with activity, his oxygen saturation is 100% on Room air today. He does have a chronic dry cough all the time per his report.   BP 132/86 mmHg  Pulse 97  Temp(Src) 97.8 F (36.6 C)  Ht '5\' 9"'$  (1.753 m)  Wt 132 lb 9.6 oz (60.147 kg)  BMI 19.57 kg/m2  SpO2 100%   Wt Readings from Last 3 Encounters:  05/24/15 132 lb 9.6 oz (60.147 kg)  08/13/14 122 lb 12.8 oz (55.702 kg)  08/06/14 115 lb 9.6 oz (52.436 kg)

## 2015-05-25 ENCOUNTER — Other Ambulatory Visit: Payer: Self-pay | Admitting: Hematology & Oncology

## 2015-05-25 DIAGNOSIS — C3491 Malignant neoplasm of unspecified part of right bronchus or lung: Secondary | ICD-10-CM

## 2015-05-27 ENCOUNTER — Telehealth: Payer: Self-pay | Admitting: *Deleted

## 2015-05-27 ENCOUNTER — Encounter: Payer: Self-pay | Admitting: Internal Medicine

## 2015-05-27 NOTE — Telephone Encounter (Signed)
CALLED PATIENT TO INFORM OF APPTS., LVM FOR A RETURN CALL

## 2015-05-28 ENCOUNTER — Telehealth: Payer: Self-pay | Admitting: Hematology & Oncology

## 2015-05-28 NOTE — Telephone Encounter (Signed)
Called patient's home # and l/m stating that Dr. Marin Olp would like to see him in our office.        [facility].

## 2015-06-12 ENCOUNTER — Ambulatory Visit: Payer: Medicaid Other | Admitting: Internal Medicine

## 2015-07-01 ENCOUNTER — Ambulatory Visit: Payer: Medicaid Other | Admitting: Gastroenterology

## 2015-11-06 ENCOUNTER — Encounter (HOSPITAL_COMMUNITY): Payer: Self-pay | Admitting: Emergency Medicine

## 2015-11-06 ENCOUNTER — Inpatient Hospital Stay (HOSPITAL_COMMUNITY)
Admission: EM | Admit: 2015-11-06 | Discharge: 2015-11-08 | DRG: 193 | Disposition: A | Discharge status: Hospice | Payer: Medicaid Other | Attending: Family Medicine | Admitting: Family Medicine

## 2015-11-06 DIAGNOSIS — R630 Anorexia: Secondary | ICD-10-CM

## 2015-11-06 DIAGNOSIS — G4700 Insomnia, unspecified: Secondary | ICD-10-CM | POA: Diagnosis present

## 2015-11-06 DIAGNOSIS — Z803 Family history of malignant neoplasm of breast: Secondary | ICD-10-CM

## 2015-11-06 DIAGNOSIS — R000 Tachycardia, unspecified: Secondary | ICD-10-CM | POA: Diagnosis present

## 2015-11-06 DIAGNOSIS — J189 Pneumonia, unspecified organism: Principal | ICD-10-CM | POA: Diagnosis present

## 2015-11-06 DIAGNOSIS — Z923 Personal history of irradiation: Secondary | ICD-10-CM

## 2015-11-06 DIAGNOSIS — Z9889 Other specified postprocedural states: Secondary | ICD-10-CM

## 2015-11-06 DIAGNOSIS — C3491 Malignant neoplasm of unspecified part of right bronchus or lung: Secondary | ICD-10-CM | POA: Diagnosis present

## 2015-11-06 DIAGNOSIS — C7951 Secondary malignant neoplasm of bone: Secondary | ICD-10-CM | POA: Diagnosis present

## 2015-11-06 DIAGNOSIS — Z681 Body mass index (BMI) 19 or less, adult: Secondary | ICD-10-CM

## 2015-11-06 DIAGNOSIS — Z66 Do not resuscitate: Secondary | ICD-10-CM | POA: Diagnosis present

## 2015-11-06 DIAGNOSIS — E860 Dehydration: Secondary | ICD-10-CM | POA: Diagnosis present

## 2015-11-06 DIAGNOSIS — I739 Peripheral vascular disease, unspecified: Secondary | ICD-10-CM | POA: Diagnosis present

## 2015-11-06 DIAGNOSIS — D638 Anemia in other chronic diseases classified elsewhere: Secondary | ICD-10-CM | POA: Diagnosis present

## 2015-11-06 DIAGNOSIS — J439 Emphysema, unspecified: Secondary | ICD-10-CM | POA: Diagnosis present

## 2015-11-06 DIAGNOSIS — D72829 Elevated white blood cell count, unspecified: Secondary | ICD-10-CM | POA: Diagnosis present

## 2015-11-06 DIAGNOSIS — R64 Cachexia: Secondary | ICD-10-CM | POA: Diagnosis present

## 2015-11-06 DIAGNOSIS — E1151 Type 2 diabetes mellitus with diabetic peripheral angiopathy without gangrene: Secondary | ICD-10-CM | POA: Diagnosis present

## 2015-11-06 DIAGNOSIS — Z79899 Other long term (current) drug therapy: Secondary | ICD-10-CM

## 2015-11-06 DIAGNOSIS — R531 Weakness: Secondary | ICD-10-CM

## 2015-11-06 DIAGNOSIS — Z515 Encounter for palliative care: Secondary | ICD-10-CM | POA: Diagnosis present

## 2015-11-06 DIAGNOSIS — Z8249 Family history of ischemic heart disease and other diseases of the circulatory system: Secondary | ICD-10-CM

## 2015-11-06 DIAGNOSIS — E871 Hypo-osmolality and hyponatremia: Secondary | ICD-10-CM | POA: Diagnosis present

## 2015-11-06 DIAGNOSIS — R918 Other nonspecific abnormal finding of lung field: Secondary | ICD-10-CM

## 2015-11-06 DIAGNOSIS — Z806 Family history of leukemia: Secondary | ICD-10-CM

## 2015-11-06 DIAGNOSIS — Z87891 Personal history of nicotine dependence: Secondary | ICD-10-CM

## 2015-11-06 DIAGNOSIS — Z833 Family history of diabetes mellitus: Secondary | ICD-10-CM

## 2015-11-06 DIAGNOSIS — E119 Type 2 diabetes mellitus without complications: Secondary | ICD-10-CM

## 2015-11-06 DIAGNOSIS — J90 Pleural effusion, not elsewhere classified: Secondary | ICD-10-CM | POA: Diagnosis present

## 2015-11-06 DIAGNOSIS — Z794 Long term (current) use of insulin: Secondary | ICD-10-CM

## 2015-11-06 DIAGNOSIS — R627 Adult failure to thrive: Secondary | ICD-10-CM | POA: Diagnosis present

## 2015-11-06 DIAGNOSIS — Z590 Homelessness: Secondary | ICD-10-CM

## 2015-11-06 DIAGNOSIS — C787 Secondary malignant neoplasm of liver and intrahepatic bile duct: Secondary | ICD-10-CM | POA: Diagnosis present

## 2015-11-06 DIAGNOSIS — Z7902 Long term (current) use of antithrombotics/antiplatelets: Secondary | ICD-10-CM

## 2015-11-06 DIAGNOSIS — E43 Unspecified severe protein-calorie malnutrition: Secondary | ICD-10-CM | POA: Diagnosis present

## 2015-11-06 DIAGNOSIS — R21 Rash and other nonspecific skin eruption: Secondary | ICD-10-CM | POA: Diagnosis present

## 2015-11-06 DIAGNOSIS — M79604 Pain in right leg: Secondary | ICD-10-CM | POA: Diagnosis present

## 2015-11-06 DIAGNOSIS — D630 Anemia in neoplastic disease: Secondary | ICD-10-CM | POA: Diagnosis present

## 2015-11-06 NOTE — ED Notes (Signed)
According to patient he has not had an appetite for about 3-4 weeks.  States he has an appointment with the oncologist for this lung cancer on December the 3rd and was hoping to "hold out" until then to get his rash checked.  Pt has scaly rash under left breast and states he has some on his back as well.  Pt has an implanted port on the left chest and claims it has never been accessed.

## 2015-11-06 NOTE — ED Notes (Signed)
Bed: WA07 Expected date:  Expected time:  Means of arrival:  Comments: EMS 60 yo male from home/weak-has shingles and lung cancer-unable to eat

## 2015-11-06 NOTE — ED Provider Notes (Signed)
CSN: [id number]     Arrival date & time 11/06/15  2156 History  By signing my name below, I, Reola Mosher, attest that this documentation has been prepared under the direction and in the presence of Sharlett Iles, MD. Electronically Signed: Reola Mosher, ED Scribe. 11/07/2015. 12:11 AM.   Chief Complaint  Patient presents with  . Rash  . Anorexia   The history is provided by the patient and the EMS personnel. No language interpreter was used.   HPI Comments: Dustin Freeman is a 61 y.o. male BIB EMS with a PMHx of DM, PVD, collagen vascular disease, and lung cancer who presents to the Emergency Department complaining of gradual onset, constant, unchanged loss of appetite x 3-4 weeks. He has had a few episodes of emesis, and the last episode of emesis was yesterday. He also reports that he had a subjective fever this morning. He has had a cough x 1 months, but notes that it is due to his emphysema. He states that he has chronic SOB which has not worsened from baseline, but does not using breathing treatments to control it. Pt reports that he has lung cancer, and he has finished a course of chemotherapy recently, with no known plan to continue. Pt notes that he has an appointment with his oncologist on August 3rd to discuss his next steps. He denies diarrhea.   Pt is also complaining of a gradual onset, gradually worsening, constant, painful rash to his left upper abdomen x 2-3 months. He thinks it is shingles but he has never had the rash checked by a medical professional. He reports that he has been putting Hydrocortisone on the area with no relief.   Past Medical History  Diagnosis Date  . History of palpitations     During [employer] intake exam, not since  . DM (diabetes mellitus) ([place])   . PVD (peripheral vascular disease) ([facility])     s/p stent to right external iliac artery (05/15/14 - Dr. Donnetta Hutching)  . Collagen vascular disease ([place])   . Cancer [facility])    Past Surgical History   Procedure Laterality Date  . Lower extremity angiogram N/A 05/15/2014    Procedure: LOWER EXTREMITY ANGIOGRAM;  Surgeon: Serafina Mitchell, MD;  Location: [facility] CATH LAB;  Service: Cardiovascular;  Laterality: N/A;  . Video bronchoscopy Bilateral 06/28/2014    Procedure: VIDEO BRONCHOSCOPY WITHOUT FLUORO;  Surgeon: Rush Farmer, MD;  Location: [facility] ENDOSCOPY;  Service: Endoscopy;  Laterality: Bilateral;   Family History  Problem Relation Age of Onset  . Diabetes Mellitus I Mother   . Peripheral vascular disease Father   . Leukemia Maternal Grandfather   . Leukemia Maternal Uncle   . Kidney disease Cousin   . Breast cancer Cousin    Social History  Substance Use Topics  . Smoking status: Former Smoker -- 0.50 packs/day for 47 years    Types: Cigarettes    Quit date: 05/22/2014  . Smokeless tobacco: None  . Alcohol Use: No    Review of Systems A complete 10 system review of systems was obtained and all systems are negative except as noted in the HPI and PMH.   Allergies  Review of patient's allergies indicates no known allergies.  Home Medications   Prior to Admission medications   Medication Sig Start Date End Date Taking? Authorizing Provider  antiseptic oral rinse (CPC / CETYLPYRIDINIUM CHLORIDE 0.05%) 0.05 % LIQD solution 7 mLs by Mouth Rinse route 2 times daily at 12  noon and 4 pm. 07/10/14   Robbie Lis, MD  blood glucose meter kit and supplies KIT Diagnosis: diabetes mellitus. Dispense based on patient and insurance preference. Use up to four times daily as directed. (FOR ICD-9 250.00, 250.01). 05/16/14   Ripudeep Krystal Eaton, MD  chlorhexidine (PERIDEX) 0.12 % solution 15 mLs by Mouth Rinse route 2 (two) times daily. Patient not taking: Reported on 05/24/2015 07/10/14   Robbie Lis, MD  clopidogrel (PLAVIX) 75 MG tablet Take 1 tablet (75 mg total) by mouth daily. 05/16/14   Ripudeep Krystal Eaton, MD  dexamethasone (DECADRON) 4 MG tablet Take 1 tablet (4 mg total) by mouth daily. Patient  not taking: Reported on 08/20/2014 07/10/14   Robbie Lis, MD  dronabinol (MARINOL) 5 MG capsule Take 1 capsule (5 mg total) by mouth 2 (two) times daily before lunch and supper. Patient not taking: Reported on 05/24/2015 07/10/14   Robbie Lis, MD  feeding supplement, ENSURE COMPLETE, (ENSURE COMPLETE) LIQD Take 237 mLs by mouth 3 (three) times daily between meals. Patient not taking: Reported on 05/24/2015 07/10/14   Robbie Lis, MD  insulin aspart (NOVOLOG) 100 UNIT/ML injection Inject 0-15 Units into the skin 3 (three) times daily with meals. Patient not taking: Reported on 05/24/2015 07/10/14   Robbie Lis, MD  ipratropium-albuterol (DUONEB) 0.5-2.5 (3) MG/3ML SOLN Take 3 mLs by nebulization every 6 (six) hours as needed. Patient not taking: Reported on 05/24/2015 07/10/14   Robbie Lis, MD  LORazepam (ATIVAN) 1 MG tablet Take 1 tablet (1 mg total) by mouth every 12 (twelve) hours as needed for anxiety. Patient not taking: Reported on 05/24/2015 07/10/14   Robbie Lis, MD  metFORMIN (GLUCOPHAGE) 500 MG tablet Take 500 mg by mouth daily with breakfast.    Historical Provider, MD  nicotine (NICODERM CQ - DOSED IN MG/24 HOURS) 21 mg/24hr patch Place 1 patch (21 mg total) onto the skin daily. Patient not taking: Reported on 05/24/2015 05/16/14   Ripudeep Krystal Eaton, MD  oxyCODONE (OXY IR/ROXICODONE) 5 MG immediate release tablet Take 1 tablet (5 mg total) by mouth every 6 (six) hours as needed for moderate pain. Patient not taking: Reported on 05/24/2015 07/10/14   Robbie Lis, MD  pantoprazole (PROTONIX) 40 MG tablet Take 1 tablet (40 mg total) by mouth daily. Patient not taking: Reported on 05/24/2015 07/10/14   Robbie Lis, MD  pregabalin (LYRICA) 75 MG capsule Take 1 capsule (75 mg total) by mouth 2 (two) times daily. 05/16/14   Ripudeep Krystal Eaton, MD  senna-docusate (SENOKOT-S) 8.6-50 MG per tablet Take 2 tablets by mouth at bedtime. Patient not taking: Reported on 05/24/2015 07/10/14   Robbie Lis, MD   BP  130/85 mmHg  Pulse 100  Temp(Src) 97.3 F (36.3 C) (Oral)  Resp 21  Ht 5' 9"  (1.753 m)  Wt 115 lb (52.164 kg)  BMI 16.97 kg/m2  SpO2 94%   Physical Exam  Constitutional: He is oriented to person, place, and time. No distress.  cachectic, frail, chronically ill-appearing  HENT:  Head: Normocephalic and atraumatic.  dry mucous membranes  Eyes: Conjunctivae are normal. Pupils are equal, round, and reactive to light.  Neck: Neck supple.  Cardiovascular: Normal rate, regular rhythm and normal heart sounds.   No murmur heard. Pulmonary/Chest:    Mildly increased WOB, coarse BS b/l with diminished BS R lung base  Tenderness at rash on anterior L lower lateral chest wall  Abdominal: Soft. Bowel  sounds are normal. He exhibits no distension. There is tenderness (LUQ at area of rash).    Musculoskeletal: He exhibits no edema.  Neurological: He is alert and oriented to person, place, and time.  Fluent speech  Skin: Skin is warm and dry.  Faint rash on L lower lateral chest wall and upper left abdomen with tenderness to light palpation, scaling skin but no vesicles or crusted lesions  Psychiatric: He has a normal mood and affect. Judgment normal.  Nursing note and vitals reviewed.  ED Course  Procedures   DIAGNOSTIC STUDIES: Oxygen Saturation is 94% on RA, adequate by my interpretation.   COORDINATION OF CARE: 12:11 AM-Discussed next steps with pt. Pt verbalized understanding and is agreeable with the plan.   Labs Review Labs Reviewed  COMPREHENSIVE METABOLIC PANEL - Abnormal; Notable for the following:    Sodium 134 (*)    Chloride 100 (*)    Calcium 10.8 (*)    Total Protein 6.2 (*)    Albumin 2.2 (*)    ALT 14 (*)    Total Bilirubin 1.3 (*)    All other components within normal limits  CBC WITH DIFFERENTIAL/PLATELET - Abnormal; Notable for the following:    WBC 14.7 (*)    RBC 4.02 (*)    Hemoglobin 10.7 (*)    HCT 32.5 (*)    RDW 16.1 (*)    Neutro Abs 12.5 (*)     All other components within normal limits  URINALYSIS, ROUTINE W REFLEX MICROSCOPIC (NOT AT [facility]) - Abnormal; Notable for the following:    Color, Urine AMBER (*)    APPearance CLOUDY (*)    All other components within normal limits  CULTURE, EXPECTORATED SPUTUM-ASSESSMENT  GRAM STAIN  PROCALCITONIN  HIV ANTIBODY (ROUTINE TESTING)  STREP PNEUMONIAE URINARY ANTIGEN  LEGIONELLA PNEUMOPHILA SEROGP 1 UR AG  HEMOGLOBIN A1C  OSMOLALITY  CALCIUM, IONIZED  OSMOLALITY, URINE  I-STAT CG4 LACTIC ACID, ED    Imaging Review Dg Chest 2 View  11/07/2015  CLINICAL DATA:  Anorexia.  Lung cancer. EXAM: CHEST  2 VIEW COMPARISON:  06/30/2014 FINDINGS: Bilateral small pleural effusions. Greater opacity on the right due to postobstructive consolidation and volume loss in this patient with obstructing right lower lobe mass on prior CT imaging. Bilateral pulmonary nodules, 1 cm and smaller. Background hyperinflation. Normal heart size. Porta catheter on the left with tip in good position. IMPRESSION: 1. Limited assessment without recent imaging. 2. Left lower lobe airspace disease concerning for pneumonia. 3. Right basilar opacity related to postobstructive pneumonia and collapse on 2016 comparison. 4. Bilateral pulmonary nodules consistent with metastases in this setting. 5. Bilateral small pleural effusion. Electronically Signed   By: Monte Fantasia M.D.   On: 11/07/2015 01:34    I have personally reviewed and evaluated these lab results as part of my medical decision-making.   EKG Interpretation   Date/Time:  Thursday November 07 2015 00:42:13 EDT Ventricular Rate:  93 PR Interval:    QRS Duration: 80 QT Interval:  334 QTC Calculation: 416 R Axis:   71 Text Interpretation:  Sinus rhythm Low voltage, extremity and precordial  leads No significant change since last tracing Confirmed by Paislea Hatton MD,  Kerryn Tennant ([id number]) on 11/07/2015 1:18:30 AM     Medications  ipratropium-albuterol (DUONEB) 0.5-2.5 (3)  MG/3ML nebulizer solution 3 mL (not administered)    MDM   Final diagnoses:  Community acquired pneumonia  Anorexia   PT w/ h/o lung cancer p/w multiple complaints including several months  of painful rash on left chest/upper abd which he thinks is shingles, anorexia, poor sleep. He was cachectic and chronically ill appearing but awake and alert, NAD. VS notable for borderline tachycardia, BP reassuring. Afebrile w/ 02 sat 95% on RA. Mildly increased work of breathing and rhonchi on exam. He appeared dry. Gave the patient a DuoNeb and IVF bolus and obtained above lab work as well as chest x-ray.  Labs show WBC 14.7, Hbg 10.7, Alb 2.2, protein 6.2. Chest x-ray shows left lower and right basilar opacities suggestive of pneumonia. I'm concerned about the patient's pneumonia in the setting of known metastatic lung cancer and failure to thrive at home. Gave the patient ceftriaxone and azithromycin and discussed admission with [facility] hospitalist, Dr. Loleta Books. Pt admitted for further treatment.  I personally performed the services described in this documentation, which was scribed in my presence. The recorded information has been reviewed and is accurate.    Sharlett Iles, MD 11/07/15 [phone]

## 2015-11-06 NOTE — ED Notes (Signed)
Pt presents from home via EMS with complaints of "hurting" for the past few months from undiagnosed shingles on the left upper quadrant under the left breast.  He also states for the past few days he has not been able to eat.  Pt states he is unable to walk due to a stent in his right leg and is on Plavix.  Unidentified small bug found on patient.

## 2015-11-07 ENCOUNTER — Observation Stay (HOSPITAL_COMMUNITY): Payer: Medicaid Other

## 2015-11-07 ENCOUNTER — Telehealth: Payer: Self-pay | Admitting: Family

## 2015-11-07 ENCOUNTER — Encounter (HOSPITAL_COMMUNITY): Payer: Self-pay | Admitting: Family Medicine

## 2015-11-07 ENCOUNTER — Emergency Department (HOSPITAL_COMMUNITY): Payer: Medicaid Other

## 2015-11-07 DIAGNOSIS — E871 Hypo-osmolality and hyponatremia: Secondary | ICD-10-CM

## 2015-11-07 DIAGNOSIS — I739 Peripheral vascular disease, unspecified: Secondary | ICD-10-CM

## 2015-11-07 DIAGNOSIS — R64 Cachexia: Secondary | ICD-10-CM | POA: Diagnosis present

## 2015-11-07 DIAGNOSIS — E860 Dehydration: Secondary | ICD-10-CM | POA: Diagnosis present

## 2015-11-07 DIAGNOSIS — J439 Emphysema, unspecified: Secondary | ICD-10-CM | POA: Diagnosis present

## 2015-11-07 DIAGNOSIS — G4700 Insomnia, unspecified: Secondary | ICD-10-CM | POA: Diagnosis present

## 2015-11-07 DIAGNOSIS — Z87891 Personal history of nicotine dependence: Secondary | ICD-10-CM | POA: Diagnosis not present

## 2015-11-07 DIAGNOSIS — R627 Adult failure to thrive: Secondary | ICD-10-CM | POA: Diagnosis present

## 2015-11-07 DIAGNOSIS — C3491 Malignant neoplasm of unspecified part of right bronchus or lung: Secondary | ICD-10-CM | POA: Diagnosis present

## 2015-11-07 DIAGNOSIS — Z681 Body mass index (BMI) 19 or less, adult: Secondary | ICD-10-CM | POA: Diagnosis not present

## 2015-11-07 DIAGNOSIS — J90 Pleural effusion, not elsewhere classified: Secondary | ICD-10-CM | POA: Diagnosis present

## 2015-11-07 DIAGNOSIS — J189 Pneumonia, unspecified organism: Secondary | ICD-10-CM | POA: Diagnosis present

## 2015-11-07 DIAGNOSIS — Z66 Do not resuscitate: Secondary | ICD-10-CM | POA: Diagnosis present

## 2015-11-07 DIAGNOSIS — D638 Anemia in other chronic diseases classified elsewhere: Secondary | ICD-10-CM

## 2015-11-07 DIAGNOSIS — D630 Anemia in neoplastic disease: Secondary | ICD-10-CM | POA: Diagnosis present

## 2015-11-07 DIAGNOSIS — E1151 Type 2 diabetes mellitus with diabetic peripheral angiopathy without gangrene: Secondary | ICD-10-CM | POA: Diagnosis present

## 2015-11-07 DIAGNOSIS — R531 Weakness: Secondary | ICD-10-CM | POA: Diagnosis present

## 2015-11-07 DIAGNOSIS — E119 Type 2 diabetes mellitus without complications: Secondary | ICD-10-CM

## 2015-11-07 DIAGNOSIS — M79604 Pain in right leg: Secondary | ICD-10-CM | POA: Diagnosis present

## 2015-11-07 DIAGNOSIS — Z8249 Family history of ischemic heart disease and other diseases of the circulatory system: Secondary | ICD-10-CM | POA: Diagnosis not present

## 2015-11-07 DIAGNOSIS — R630 Anorexia: Secondary | ICD-10-CM | POA: Insufficient documentation

## 2015-11-07 DIAGNOSIS — R21 Rash and other nonspecific skin eruption: Secondary | ICD-10-CM | POA: Diagnosis present

## 2015-11-07 DIAGNOSIS — C787 Secondary malignant neoplasm of liver and intrahepatic bile duct: Secondary | ICD-10-CM | POA: Diagnosis present

## 2015-11-07 DIAGNOSIS — C7951 Secondary malignant neoplasm of bone: Secondary | ICD-10-CM | POA: Diagnosis present

## 2015-11-07 DIAGNOSIS — D72829 Elevated white blood cell count, unspecified: Secondary | ICD-10-CM

## 2015-11-07 DIAGNOSIS — Z923 Personal history of irradiation: Secondary | ICD-10-CM | POA: Diagnosis not present

## 2015-11-07 DIAGNOSIS — E43 Unspecified severe protein-calorie malnutrition: Secondary | ICD-10-CM | POA: Diagnosis present

## 2015-11-07 DIAGNOSIS — Z590 Homelessness: Secondary | ICD-10-CM | POA: Diagnosis not present

## 2015-11-07 DIAGNOSIS — R000 Tachycardia, unspecified: Secondary | ICD-10-CM | POA: Diagnosis present

## 2015-11-07 LAB — COMPREHENSIVE METABOLIC PANEL
ALT: 14 U/L — ABNORMAL LOW (ref 17–63)
ANION GAP: 6 (ref 5–15)
AST: 19 U/L (ref 15–41)
Albumin: 2.2 g/dL — ABNORMAL LOW (ref 3.5–5.0)
Alkaline Phosphatase: 114 U/L (ref 38–126)
BUN: 15 mg/dL (ref 6–20)
CHLORIDE: 100 mmol/L — AB (ref 101–111)
CO2: 28 mmol/L (ref 22–32)
Calcium: 10.8 mg/dL — ABNORMAL HIGH (ref 8.9–10.3)
Creatinine, Ser: 0.95 mg/dL (ref 0.61–1.24)
Glucose, Bld: 99 mg/dL (ref 65–99)
POTASSIUM: 4 mmol/L (ref 3.5–5.1)
Sodium: 134 mmol/L — ABNORMAL LOW (ref 135–145)
TOTAL PROTEIN: 6.2 g/dL — AB (ref 6.5–8.1)
Total Bilirubin: 1.3 mg/dL — ABNORMAL HIGH (ref 0.3–1.2)

## 2015-11-07 LAB — GLUCOSE, CAPILLARY
GLUCOSE-CAPILLARY: 101 mg/dL — AB (ref 65–99)
GLUCOSE-CAPILLARY: 88 mg/dL (ref 65–99)
GLUCOSE-CAPILLARY: 111 mg/dL — AB (ref 65–99)
Glucose-Capillary: 89 mg/dL (ref 65–99)

## 2015-11-07 LAB — URINALYSIS, ROUTINE W REFLEX MICROSCOPIC
Bilirubin Urine: NEGATIVE
GLUCOSE, UA: NEGATIVE mg/dL
Hgb urine dipstick: NEGATIVE
KETONES UR: NEGATIVE mg/dL
LEUKOCYTES UA: NEGATIVE
NITRITE: NEGATIVE
PH: 6.5 (ref 5.0–8.0)
PROTEIN: NEGATIVE mg/dL
Specific Gravity, Urine: 1.017 (ref 1.005–1.030)

## 2015-11-07 LAB — CBC WITH DIFFERENTIAL/PLATELET
BASOS ABS: 0 10*3/uL (ref 0.0–0.1)
Basophils Relative: 0 %
Eosinophils Absolute: 0.1 10*3/uL (ref 0.0–0.7)
Eosinophils Relative: 1 %
HCT: 32.5 % — ABNORMAL LOW (ref 39.0–52.0)
HEMOGLOBIN: 10.7 g/dL — AB (ref 13.0–17.0)
LYMPHS ABS: 1 10*3/uL (ref 0.7–4.0)
LYMPHS PCT: 7 %
MCH: 26.6 pg (ref 26.0–34.0)
MCHC: 32.9 g/dL (ref 30.0–36.0)
MCV: 80.8 fL (ref 78.0–100.0)
Monocytes Absolute: 1 10*3/uL (ref 0.1–1.0)
Monocytes Relative: 7 %
NEUTROS ABS: 12.5 10*3/uL — AB (ref 1.7–7.7)
NEUTROS PCT: 85 %
Platelets: 386 10*3/uL (ref 150–400)
RBC: 4.02 MIL/uL — AB (ref 4.22–5.81)
RDW: 16.1 % — ABNORMAL HIGH (ref 11.5–15.5)
WBC: 14.7 10*3/uL — AB (ref 4.0–10.5)

## 2015-11-07 LAB — I-STAT CG4 LACTIC ACID, ED: Lactic Acid, Venous: 1.34 mmol/L (ref 0.5–1.9)

## 2015-11-07 LAB — OSMOLALITY: OSMOLALITY: 282 mosm/kg (ref 275–295)

## 2015-11-07 LAB — HIV ANTIBODY (ROUTINE TESTING W REFLEX): HIV Screen 4th Generation wRfx: NONREACTIVE

## 2015-11-07 LAB — PROCALCITONIN

## 2015-11-07 LAB — OSMOLALITY, URINE: Osmolality, Ur: 510 mOsm/kg (ref 300–900)

## 2015-11-07 MED ORDER — LIDOCAINE 5 % EX PTCH
1.0000 | MEDICATED_PATCH | CUTANEOUS | Status: DC
  Administered 2015-11-07: 1 via TRANSDERMAL
  Filled 2015-11-07: qty 1

## 2015-11-07 MED ORDER — ENOXAPARIN SODIUM 40 MG/0.4ML ~~LOC~~ SOLN
40.0000 mg | SUBCUTANEOUS | Status: DC
  Administered 2015-11-07 – 2015-11-08 (×2): 40 mg via SUBCUTANEOUS
  Filled 2015-11-07 (×2): qty 0.4

## 2015-11-07 MED ORDER — INSULIN ASPART 100 UNIT/ML ~~LOC~~ SOLN: 0.0000 [IU] | Freq: Every day | SUBCUTANEOUS | Status: DC

## 2015-11-07 MED ORDER — ENSURE ENLIVE PO LIQD
237.0000 mL | Freq: Three times a day (TID) | ORAL | Status: DC
  Administered 2015-11-07 – 2015-11-08 (×2): 237 mL via ORAL

## 2015-11-07 MED ORDER — ALBUTEROL SULFATE (2.5 MG/3ML) 0.083% IN NEBU: 2.5000 mg | INHALATION_SOLUTION | RESPIRATORY_TRACT | Status: DC | PRN

## 2015-11-07 MED ORDER — DEXTROSE 5 % IV SOLN
500.0000 mg | Freq: Once | INTRAVENOUS | Status: AC
  Administered 2015-11-07: 500 mg via INTRAVENOUS
  Filled 2015-11-07: qty 500

## 2015-11-07 MED ORDER — SODIUM CHLORIDE 0.9 % IV BOLUS (SEPSIS)
1000.0000 mL | Freq: Once | INTRAVENOUS | Status: AC
Start: 2015-11-07 — End: 2015-11-07
  Administered 2015-11-07: 1000 mL via INTRAVENOUS

## 2015-11-07 MED ORDER — INSULIN ASPART 100 UNIT/ML ~~LOC~~ SOLN: 0.0000 [IU] | Freq: Three times a day (TID) | SUBCUTANEOUS | Status: DC

## 2015-11-07 MED ORDER — SENNOSIDES-DOCUSATE SODIUM 8.6-50 MG PO TABS: 1.0000 | ORAL_TABLET | Freq: Every evening | ORAL | Status: DC | PRN

## 2015-11-07 MED ORDER — IOPAMIDOL (ISOVUE-300) INJECTION 61%
100.0000 mL | Freq: Once | INTRAVENOUS | Status: AC | PRN
  Administered 2015-11-07: 80 mL via INTRAVENOUS

## 2015-11-07 MED ORDER — MORPHINE SULFATE (PF) 2 MG/ML IV SOLN
1.0000 mg | INTRAVENOUS | Status: DC | PRN
  Administered 2015-11-07 (×2): 1 mg via INTRAVENOUS
  Filled 2015-11-07 (×2): qty 1

## 2015-11-07 MED ORDER — IPRATROPIUM-ALBUTEROL 0.5-2.5 (3) MG/3ML IN SOLN
3.0000 mL | Freq: Once | RESPIRATORY_TRACT | Status: AC
  Administered 2015-11-07: 3 mL via RESPIRATORY_TRACT
  Filled 2015-11-07: qty 3

## 2015-11-07 MED ORDER — SODIUM CHLORIDE 0.9 % IV SOLN
INTRAVENOUS | Status: DC
  Administered 2015-11-07 (×2): via INTRAVENOUS

## 2015-11-07 MED ORDER — ONDANSETRON HCL 4 MG/2ML IJ SOLN
4.0000 mg | Freq: Four times a day (QID) | INTRAMUSCULAR | Status: DC | PRN
  Administered 2015-11-08: 4 mg via INTRAVENOUS
  Filled 2015-11-07: qty 2

## 2015-11-07 MED ORDER — DIATRIZOATE MEGLUMINE & SODIUM 66-10 % PO SOLN: 15.0000 mL | ORAL | Status: DC | PRN

## 2015-11-07 MED ORDER — TRAZODONE HCL 50 MG PO TABS
50.0000 mg | ORAL_TABLET | Freq: Every day | ORAL | Status: DC
Start: 2015-11-07 — End: 2015-11-08
  Administered 2015-11-07: 50 mg via ORAL
  Filled 2015-11-07: qty 1

## 2015-11-07 MED ORDER — CEFTRIAXONE SODIUM 1 G IJ SOLR
1.0000 g | Freq: Once | INTRAMUSCULAR | Status: AC
  Administered 2015-11-07: 1 g via INTRAVENOUS
  Filled 2015-11-07: qty 10

## 2015-11-07 MED ORDER — ACETAMINOPHEN 325 MG PO TABS: 650.0000 mg | ORAL_TABLET | Freq: Four times a day (QID) | ORAL | Status: DC | PRN

## 2015-11-07 MED ORDER — DEXTROSE 5 % IV SOLN
500.0000 mg | INTRAVENOUS | Status: DC
  Administered 2015-11-07: 500 mg via INTRAVENOUS
  Filled 2015-11-07 (×2): qty 500

## 2015-11-07 MED ORDER — DEXTROSE 5 % IV SOLN
1.0000 g | INTRAVENOUS | Status: DC
  Administered 2015-11-07: 1 g via INTRAVENOUS
  Filled 2015-11-07 (×2): qty 10

## 2015-11-07 MED ORDER — ENSURE ENLIVE PO LIQD: 237.0000 mL | Freq: Two times a day (BID) | ORAL | Status: DC

## 2015-11-07 MED ORDER — ACETAMINOPHEN 650 MG RE SUPP: 650.0000 mg | Freq: Four times a day (QID) | RECTAL | Status: DC | PRN

## 2015-11-07 MED ORDER — OXYCODONE HCL 5 MG PO TABS: 5.0000 mg | ORAL_TABLET | ORAL | Status: DC | PRN

## 2015-11-07 MED ORDER — GABAPENTIN 100 MG PO CAPS
100.0000 mg | ORAL_CAPSULE | Freq: Three times a day (TID) | ORAL | Status: DC
  Administered 2015-11-07 – 2015-11-08 (×3): 100 mg via ORAL
  Filled 2015-11-07 (×3): qty 1

## 2015-11-07 MED ORDER — ONDANSETRON HCL 4 MG PO TABS: 4.0000 mg | ORAL_TABLET | Freq: Four times a day (QID) | ORAL | Status: DC | PRN

## 2015-11-07 NOTE — Progress Notes (Signed)
Pharmacy Antibiotic Note  Dustin Freeman is a 60 y.o. male admitted on 11/06/2015 with pneumonia.  Pharmacy has been consulted for Rocephin/azithromycin based on manufacturer dosing recommendations.  Plan: Ceftriaxone 1gm iv q24hr Azithromycin '500mg'$  iv q24hr   Height: '5\' 9"'$  (175.3 cm) Weight: 115 lb (52.164 kg) IBW/kg (Calculated) : 70.7  Temp (24hrs), Avg:97.5 F (36.4 C), Min:97.3 F (36.3 C), Max:98 F (36.7 C)   Recent Labs Lab 11/07/15 0019 11/07/15 0044  WBC 14.7*  --   CREATININE 0.95  --   LATICACIDVEN  --  1.34    Estimated Creatinine Clearance: 60.3 mL/min (by C-G formula based on Cr of 0.95).    No Known Allergies  Antimicrobials this admission: Ceftriaxone 11/07/2015 >> Azithromycin 11/07/2015 >>   Dose adjustments this admission: -  Microbiology results: pending  Thank you for allowing pharmacy to be a part of this patient's care.  Nani Skillern Crowford 11/07/2015 6:59 AM

## 2015-11-07 NOTE — Consult Note (Signed)
Consultation Note Date: 11/07/2015   Patient Name: Dustin Freeman  DOB: 1955/06/20  MRN: [id number]  Age / Sex: 60 y.o., male  PCP: No Pcp Per Patient Referring Physician: Murlean Iba, MD  Reason for Consultation: Establishing goals of care  HPI/Patient Profile: 60 yo man with lung cancer dx in 2016. He was diagnosed while inpatient and seen by Dr. Marin Olp. He followed up with Dr. Lanell Persons with radiation oncology and completed XRT and was completely lost to follow-up June 2016.   He is now admitted with cancer progression, cachexia, and post obstructive PNA.   I met with Dustin Freeman and his wife Dustin Freeman- their social situation in very difficult. Significant financial issues- they live in a motel, he does not have a medicaid card but was approved for servcies and has a number. He gets a small disability check. He was covered in bed bugs when he arrived to the [facility] unit. He is malnourished and appears very weak. He complains of PHN pain in his rib cage. He is unable to walk due to weakness.    Clinical Assessment and Goals of Care: Advanced Lung Cancer, local metastasis known, Post Ob PNA  His goals of care: 1. Not to suffer, he knows he has a terminal illness 2. To be able to eat-they do not have a kitchen 3. To be cared for as he gets sicker-dignity   RECOMMENDATIONS    Code Status/Advance Care Planning:  DNR    Symptom Management:   Start Gabapentin for PHN, place a lidoderm patch  Consider low dose steroids for asthenia and appetite  Palliative Prophylaxis:   Aspiration and Turn Reposition  Additional Recommendations (Limitations, Scope, Preferences):  Full Scope Treatment- amnd comfort care  No additional AGGRESSIVE INTERVENTION -no chemo radiation or sx  Psycho-social/Spiritual:   Desire for further Chaplaincy support:yes  Additional Recommendations: Caregiving   Support/Resources  Prognosis:   < 4 weeks  Discharge Planning: Will be a complicated discharge due to social determinants of health- he is essentially homeless, no access to medications, needs his medicaid card, has no transportation, severe weakness- he is also very hospice appropriate     Primary Diagnoses: Present on Admission:  . CAP (community acquired pneumonia) . Squamous cell carcinoma of right lung ([place]) . PVD (peripheral vascular disease) ([place]) . Protein-calorie malnutrition, severe ([place]) . Leukocytosis . Hypercalcemia . Anemia of chronic disease . Hyponatremia  I have reviewed the medical record, interviewed the patient and family, and examined the patient. The following aspects are pertinent.  Past Medical History  Diagnosis Date  . History of palpitations     During [employer] intake exam, not since  . DM (diabetes mellitus) ([place])   . PVD (peripheral vascular disease) ([facility])     s/p stent to right external iliac artery (05/15/14 - Dr. Donnetta Hutching)  . Collagen vascular disease ([place])   . Cancer [facility])    Social History   Social History  . Marital Status: Married    Spouse Name: N/A  . Number of Children:  N/A  . Years of Education: N/A   Social History Main Topics  . Smoking status: Former Smoker -- 0.50 packs/day for 47 years    Types: Cigarettes    Quit date: 05/22/2014  . Smokeless tobacco: None  . Alcohol Use: No  . Drug Use: No  . Sexual Activity: Not Asked   Other Topics Concern  . None   Social History Narrative   Works at [employer], lives with wife      Family History  Problem Relation Age of Onset  . Diabetes Mellitus I Mother   . Peripheral vascular disease Father   . Leukemia Maternal Grandfather   . Leukemia Maternal Uncle   . Kidney disease Cousin   . Breast cancer Cousin    Scheduled Meds: . azithromycin  500 mg Intravenous Q24H  . cefTRIAXone (ROCEPHIN)  IV  1 g Intravenous Q24H  . enoxaparin (LOVENOX) injection  40 mg Subcutaneous Q24H  .  feeding supplement (ENSURE ENLIVE)  237 mL Oral TID BM  . insulin aspart  0-5 Units Subcutaneous QHS  . insulin aspart  0-9 Units Subcutaneous TID WC   Continuous Infusions: . sodium chloride 75 mL/hr at 11/07/15 0748   PRN Meds:.acetaminophen **OR** acetaminophen, albuterol, diatrizoate meglumine-sodium, morphine injection, ondansetron **OR** ondansetron (ZOFRAN) IV, oxyCODONE, senna-docusate Medications Prior to Admission:  Prior to Admission medications   Medication Sig Start Date End Date Taking? Authorizing Provider  clopidogrel (PLAVIX) 75 MG tablet Take 1 tablet (75 mg total) by mouth daily. 05/16/14  Yes Ripudeep Krystal Eaton, MD  pregabalin (LYRICA) 75 MG capsule Take 1 capsule (75 mg total) by mouth 2 (two) times daily. 05/16/14  Yes Ripudeep K Rai, MD  blood glucose meter kit and supplies KIT Diagnosis: diabetes mellitus. Dispense based on patient and insurance preference. Use up to four times daily as directed. (FOR ICD-9 250.00, 250.01). 05/16/14   Ripudeep Krystal Eaton, MD   No Known Allergies Review of Systems  Physical Exam  Vital Signs: BP 131/105 mmHg  Pulse 98  Temp(Src) 98 F (36.7 C) (Oral)  Resp 22  Ht 5' 9"  (1.753 m)  Wt 52.164 kg (115 lb)  BMI 16.97 kg/m2  SpO2 96% Pain Assessment: 0-10   Pain Score: Asleep   SpO2: SpO2: 96 % O2 Device:SpO2: 96 % O2 Flow Rate: .O2 Flow Rate (L/min): 2 L/min  IO: Intake/output summary:  Intake/Output Summary (Last 24 hours) at 11/07/15 1528 Last data filed at 11/07/15 1456  Gross per 24 hour  Intake      0 ml  Output    250 ml  Net   -250 ml    LBM: Last BM Date: 11/06/15 Baseline Weight: Weight: 52.164 kg (115 lb) Most recent weight: Weight: 52.164 kg (115 lb)     Palliative Assessment/Data:   Flowsheet Rows        Most Recent Value   Intake Tab    Referral Department  Hospitalist   Unit at Time of Referral  Oncology Unit   Palliative Care Primary Diagnosis  Cancer   Palliative Care Type  New Palliative care    Reason for referral  Clarify Goals of Care, Counsel Regarding Hospice   Clinical Assessment    Palliative Performance Scale Score  50%   Pain Max last 24 hours  8   Dyspnea Max Last 24 Hours  8   Nausea Min Last 24 Hours  4   Anxiety Max Last 24 Hours  8   Psychosocial & Spiritual Assessment  Palliative Care Outcomes    Patient/Family meeting held?  Yes   Palliative Care Outcomes  Clarified goals of care, Provided psychosocial or spiritual support      Time In: 2:00 Time Out: 3:10 Time Total: 70 min Greater than 50%  of this time was spent counseling and coordinating care related to the above assessment and plan.  Signed by: Lane Hacker, DO   Please contact Palliative Medicine Team phone at [phone] for questions and concerns.  For individual provider: See Shea Evans

## 2015-11-07 NOTE — H&P (Signed)
History and Physical  Patient Name: Dustin Freeman     WCB:[id number]    DOB: 1955/09/10    DOA: 11/06/2015 PCP: No PCP Per Patient   Patient coming from: Motel where he lives  Chief Complaint: Rash and progressive weakness/weight loss over 1 month  HPI: Dustin Freeman is a 60 y.o. male with a past medical history significant for NIDDM, PVD with stent, and squamous lung cancer lost to follow up who presents with weakness and weight loss for 1 month.  The patient presents with about a month of weight loss, inability to sleep, weakess, and loss of energy. 2 weeks ago he syncopized.  He has had increased shortness of breath, worsening cough productive of clear nonbloody sputum, and aching right leg pain. He has not had fever, chills, rigors.  Also during this time he has had a rash appear on his left chest, with residual pain.  The rash started as "scabs" and is now large flaking patches from his back wrapping around his chest. Because of this rash, he came to the ER.  ED course: -Afebrile, tachycardic, respirations normal, hypertensives, saturating mid 90s on room air -Na 134, K 4.0, Cr 0.95 (baseline0.5), WBC 14.7K, Hgb 10.7 -Lactate normal, UA clear -CXR showed old post-obstructive pneumonia from one year ago and new pulmonary nodules concerning for metastasis -He was given 1L NS and ceftriaxone and azithromycin for CAP and TRH were asked to evaluate for admission    The patient was diagnosed with a RIGHT hilar mass a year ago, biopsy showed squamous cell lung cancer (he smoked 48 years, quit last year).  He was recommended to undergo XRT and chemo, although I see only records for radiation therapy. He was also lost to follow up and does not appear to have seen medical Oncology in over a year. He also reports to me that he doesn't take any of his Plavix or metformin and hasn't seen a doctor except for his radiation oncologist for years.     ROS: Pt complains of weight loss, insomnia,  weakness, anergy, SOB, cough, leg pain, syncope, rash.  Pt denies any fever, chills, rigors, confusion, chest pain.    All other systems negative except as just noted or noted in the history of present illness.    Past Medical History  Diagnosis Date  . History of palpitations     During [employer] intake exam, not since  . DM (diabetes mellitus) ([place])   . PVD (peripheral vascular disease) ([facility])     s/p stent to right external iliac artery (05/15/14 - Dr. Donnetta Hutching)  . Collagen vascular disease ([place])   . Cancer [facility])     Past Surgical History  Procedure Laterality Date  . Lower extremity angiogram N/A 05/15/2014    Procedure: LOWER EXTREMITY ANGIOGRAM;  Surgeon: Serafina Mitchell, MD;  Location: [facility] CATH LAB;  Service: Cardiovascular;  Laterality: N/A;  . Video bronchoscopy Bilateral 06/28/2014    Procedure: VIDEO BRONCHOSCOPY WITHOUT FLUORO;  Surgeon: Rush Farmer, MD;  Location: [facility] ENDOSCOPY;  Service: Endoscopy;  Laterality: Bilateral;    Social History: Patient lives in a motel with his wife.  The patient walks unassisted but has been too weak to walk for a month.  He quit smoking last year.  He denies alcohol.  He is on Disability he states.    No Known Allergies  Family history: family history includes Breast cancer in his cousin; Diabetes Mellitus I in his mother; Kidney disease in his cousin; Leukemia  in his maternal grandfather and maternal uncle; Peripheral vascular disease in his father.  Prior to Admission medications   Medication Sig Start Date End Date Taking? Authorizing Provider  clopidogrel (PLAVIX) 75 MG tablet Take 1 tablet (75 mg total) by mouth daily. 05/16/14  Yes Ripudeep Krystal Eaton, MD  pregabalin (LYRICA) 75 MG capsule Take 1 capsule (75 mg total) by mouth 2 (two) times daily. 05/16/14  Yes Ripudeep Krystal Eaton, MD  antiseptic oral rinse (CPC / CETYLPYRIDINIUM CHLORIDE 0.05%) 0.05 % LIQD solution 7 mLs by Mouth Rinse route 2 times daily at 12 noon and 4 pm. Patient not taking:  Reported on 11/07/2015 07/10/14   Robbie Lis, MD  blood glucose meter kit and supplies KIT Diagnosis: diabetes mellitus. Dispense based on patient and insurance preference. Use up to four times daily as directed. (FOR ICD-9 250.00, 250.01). 05/16/14   Ripudeep Krystal Eaton, MD  chlorhexidine (PERIDEX) 0.12 % solution 15 mLs by Mouth Rinse route 2 (two) times daily. Patient not taking: Reported on 05/24/2015 07/10/14   Robbie Lis, MD  dexamethasone (DECADRON) 4 MG tablet Take 1 tablet (4 mg total) by mouth daily. Patient not taking: Reported on 08/20/2014 07/10/14   Robbie Lis, MD  dronabinol (MARINOL) 5 MG capsule Take 1 capsule (5 mg total) by mouth 2 (two) times daily before lunch and supper. Patient not taking: Reported on 05/24/2015 07/10/14   Robbie Lis, MD  feeding supplement, ENSURE COMPLETE, (ENSURE COMPLETE) LIQD Take 237 mLs by mouth 3 (three) times daily between meals. Patient not taking: Reported on 05/24/2015 07/10/14   Robbie Lis, MD  insulin aspart (NOVOLOG) 100 UNIT/ML injection Inject 0-15 Units into the skin 3 (three) times daily with meals. Patient not taking: Reported on 05/24/2015 07/10/14   Robbie Lis, MD  ipratropium-albuterol (DUONEB) 0.5-2.5 (3) MG/3ML SOLN Take 3 mLs by nebulization every 6 (six) hours as needed. Patient not taking: Reported on 05/24/2015 07/10/14   Robbie Lis, MD  LORazepam (ATIVAN) 1 MG tablet Take 1 tablet (1 mg total) by mouth every 12 (twelve) hours as needed for anxiety. Patient not taking: Reported on 05/24/2015 07/10/14   Robbie Lis, MD  nicotine (NICODERM CQ - DOSED IN MG/24 HOURS) 21 mg/24hr patch Place 1 patch (21 mg total) onto the skin daily. Patient not taking: Reported on 05/24/2015 05/16/14   Ripudeep Krystal Eaton, MD  oxyCODONE (OXY IR/ROXICODONE) 5 MG immediate release tablet Take 1 tablet (5 mg total) by mouth every 6 (six) hours as needed for moderate pain. Patient not taking: Reported on 05/24/2015 07/10/14   Robbie Lis, MD  pantoprazole (PROTONIX)  40 MG tablet Take 1 tablet (40 mg total) by mouth daily. Patient not taking: Reported on 05/24/2015 07/10/14   Robbie Lis, MD  senna-docusate (SENOKOT-S) 8.6-50 MG per tablet Take 2 tablets by mouth at bedtime. Patient not taking: Reported on 05/24/2015 07/10/14   Robbie Lis, MD       Physical Exam: BP 134/79 mmHg  Pulse 94  Temp(Src) 97.3 F (36.3 C) (Oral)  Resp 23  Ht '5\' 9"'$  (1.753 m)  Wt 52.164 kg (115 lb)  BMI 16.97 kg/m2  SpO2 97% General appearance: Cachectic elderly male, alert and in mild distress from fatigue.   Eyes: Conjunctiva normal, lids and lashes normal.   PERRL.  ENT: No nasal deformity, discharge.  OP dry without lesions.   Lymph: No cervical or supraclavicular lymphadenopathy. Skin: Warm and dry.  There is  flaking in large patches in a dermatomal pattern on his left chest and he notes this is painful. He states that it started as scabs, similar to scabs on his right shoulder.  He has a dead cimex lenticularis in the waistband of his pants. Cardiac: Tachycardic, regular, nl S1-S2, no murmurs appreciated.  Capillary refill is brisk.  JVP normal.  No LE edema.  Radial and DP pulses 2+ and symmetric. Respiratory: Normal respiratory rate and rhythm.  Coarse rales bilaterally in all lung fields, no wheezes. GI: Abdomen soft without rigidity.  Non focal TTP with guarding.  No ascites, distension. Not able to appreciate hepatosplenomegaly.   MSK: No deformities or effusions.  No clubbing/cyanosis, but darkening of nails. Neuro: Cranial nerves normal.  Sensorium intact and responding to questions, attention normal.  Speech is fluent.  Moves all extremities equally and with normal coordination butglobally weak.    Psych: Affect blunted.  Judgment and insight appear normal.       Labs on Admission:  I have personally reviewed following labs and imaging studies: CBC:  Recent Labs Lab 11/07/15 0019  WBC 14.7*  NEUTROABS 12.5*  HGB 10.7*  HCT 32.5*  MCV 80.8  PLT 386     Basic Metabolic Panel:  Recent Labs Lab 11/07/15 0019  NA 134*  K 4.0  CL 100*  CO2 28  GLUCOSE 99  BUN 15  CREATININE 0.95  CALCIUM 10.8*   GFR: Estimated Creatinine Clearance: 60.3 mL/min (by C-G formula based on Cr of 0.95).  Liver Function Tests:  Recent Labs Lab 11/07/15 0019  AST 19  ALT 14*  ALKPHOS 114  BILITOT 1.3*  PROT 6.2*  ALBUMIN 2.2*   Sepsis Labs: Lactic acid normal        Radiological Exams on Admission: Personally reviewed: Dg Chest 2 View  11/07/2015  CLINICAL DATA:  Anorexia.  Lung cancer. EXAM: CHEST  2 VIEW COMPARISON:  06/30/2014 FINDINGS: Bilateral small pleural effusions. Greater opacity on the right due to postobstructive consolidation and volume loss in this patient with obstructing right lower lobe mass on prior CT imaging. Bilateral pulmonary nodules, 1 cm and smaller. Background hyperinflation. Normal heart size. Porta catheter on the left with tip in good position. IMPRESSION: 1. Limited assessment without recent imaging. 2. Left lower lobe airspace disease concerning for pneumonia. 3. Right basilar opacity related to postobstructive pneumonia and collapse on 2016 comparison. 4. Bilateral pulmonary nodules consistent with metastases in this setting. 5. Bilateral small pleural effusion. Electronically Signed   By: Monte Fantasia M.D.   On: 11/07/2015 01:34    EKG: Independently reviewed. Rate 93, QTc 419, small voltages, no ST segment changes.    Assessment/Plan 1. Weakness:  Suspect this is from progression of cancer and severe PCM.  Doubt infection contributing but possible.  Possibly dehydrated.   2. Squamous cell lung cancer:  The patient appears to have poor grasp of his medical care. I can see notes from his Rad Onc treatments last spring, but no notes for chemotherapy, although this was recommended by Dr. Marin Olp.  He says Dr. Lanell Persons (his Rad Onc) gave him chemo, then later says, no he had a port placed but never GOT chemo.  There are telephone notes from Dr. Antonieta Pert office reaching out to him regarding appointments, but no appointments were ever made.  Now, his x-ray shows extensive disease throughout.  His total bilirubin is up.  His calcium is up.    -CT of chest/abd/pelvis with contrast -Check procalcitonin -Continue ceftriaxone  and azithromycin for now, de-escalate if able  3. Hyponatremia:  -Fluids and trend BMP -Check free water clearance   4. Hypercalcemia:  -Check ionized CA  5. Hypoalbuminemia and severe protein calorie malnutrition:  -Consult nutrition -Ensure between meals  6. Leukocytosis:  Unclear etiology. Possibly pneumonia.  7. Anemia:  From cancer.  Slightly worse than last year.  8. IDDM: Not on meds (metformin) for over a year -SSI with meals -Check HgbA1c  9. Peripheral vascular disease: Not on plavix because he has neglected to get it. -Hold Plavix until work up clear       DVT prophylaxis: Lovenox  Code Status: DO NOT RESUSCITATE  Family Communication: None present  Disposition Plan: Anticipate CT imaging. I suspect his cancer is progressed and that he now has metastatic cancer and is failing to thrive. If CT shows metastatic disease, we will discuss with palliative care and oncology. Consults called: None overnight  Admission status: Medsurg, OBS At the point of initial evaluation, it is my clinical opinion that admission for OBSERVATION is reasonable and necessary because the patient's presenting complaints in the context of their chronic conditions represent sufficient risk of deterioration or significant morbidity to constitute reasonable grounds for close observation in the hospital setting, but that the patient may be medically stable for discharge from the hospital within 24 to 48 hours.    Medical decision making: Patient seen at 5:37 AM on 11/07/2015.  The patient was discussed with Dr.Little. What exists of the patient's chart was reviewed in depth.   Clinical condition: stable.        Edwin Dada [facility] Pager [phone]

## 2015-11-07 NOTE — Progress Notes (Signed)
Initial Nutrition Assessment  DOCUMENTATION CODES:   Severe malnutrition in context of chronic illness, Underweight  INTERVENTION:   Provide Ensure Enlive po TID, each supplement provides 350 kcal and 20 grams of protein Encourage small, frequent meals RD to continue to monitor  NUTRITION DIAGNOSIS:   Malnutrition related to chronic illness as evidenced by percent weight loss, energy intake < or equal to 75% for > or equal to 1 month, severe depletion of body fat, severe depletion of muscle mass.  GOAL:   Patient will meet greater than or equal to 90% of their needs  MONITOR:   PO intake, Supplement acceptance, Labs, Weight trends, I & O's  REASON FOR ASSESSMENT:   Consult Assessment of nutrition requirement/status  ASSESSMENT:   60 y.o. male with a past medical history significant for NIDDM, PVD with stent, and squamous lung cancer lost to follow up who presents with weakness and weight loss for 1 month.  Pt in room with no family at bedside. Pt currently trying to drink contrast for CT. Pt states he has had poor appetite for 3 months now. He is unable state when he received his last cancer treatment, h/o radiation therapy and possible chemo?  Pt denies any taste changes and chewing issues. States he sometimes has issues with swallowing associated with his coughing spells.  Patient states he likes Ensure supplements, will increase order to TID. Per weight history, pt has lost 17 lb since 2/3 (13% wt loss x 6 months, significant for time frame). Nutrition-Focused physical exam completed. Findings are severe fat depletion, severe muscle depletion, and no edema.    Medications reviewed. Labs reviewed: Low Na  Diet Order:  Diet regular Room service appropriate?: Yes; Fluid consistency:: Thin  Skin:  Reviewed, no issues  Last BM:  7/19  Height:   Ht Readings from Last 1 Encounters:  11/06/15 '5\' 9"'$  (1.753 m)    Weight:   Wt Readings from Last 1 Encounters:   11/06/15 115 lb (52.164 kg)    Ideal Body Weight:  72.7 kg  BMI:  Body mass index is 16.97 kg/(m^2).  Estimated Nutritional Needs:   Kcal:  1600-1800  Protein:  75-85g  Fluid:  1.8L/day  EDUCATION NEEDS:   Education needs addressed  Clayton Bibles, MS, RD, LDN Pager: [phone] After Hours Pager: [phone]

## 2015-11-07 NOTE — Progress Notes (Signed)
11/07/2015 1:37 PM  Progress Note  Pt was seen and examined.  I spoke with his oncologist.  I'm requesting a palliative medicine consult.  Pt going to radiology for CT testing now.  Will follow.     Murvin Natal, MD

## 2015-11-07 NOTE — Progress Notes (Signed)
PHARMACY NOTE -  Ceftriaxone and azithromycin  Pharmacy has been assisting with dosing of ceftriaxone and azithromycin for CAP.  No renal adjustment is need for current abx.   Will sign off at this time.  Please reconsult if a change in clinical status warrants re-evaluation of dosage.  Dia Sitter, PharmD, BCPS 11/07/2015 8:56 AM

## 2015-11-07 NOTE — ED Notes (Signed)
Patient transported to X-ray 

## 2015-11-07 NOTE — Telephone Encounter (Signed)
I spoke with Dr. Wynetta Emery regarding patient current admission at [facility] for weakness,  weight loss and rash. He was seen by Dr. Marin Olp during admission in March 2016 for lung cancer however we were never able to get him to respond and come into our office in [place] to resume care after discharge. His condition has continued to deteriorate. Dr. Marin Olp is ok with proceeding with a palliative care consult and will follow-up with the patient in the hospital.

## 2015-11-08 ENCOUNTER — Inpatient Hospital Stay (HOSPITAL_COMMUNITY): Payer: Medicaid Other

## 2015-11-08 DIAGNOSIS — J189 Pneumonia, unspecified organism: Principal | ICD-10-CM

## 2015-11-08 DIAGNOSIS — E43 Unspecified severe protein-calorie malnutrition: Secondary | ICD-10-CM

## 2015-11-08 DIAGNOSIS — J90 Pleural effusion, not elsewhere classified: Secondary | ICD-10-CM

## 2015-11-08 DIAGNOSIS — C3491 Malignant neoplasm of unspecified part of right bronchus or lung: Secondary | ICD-10-CM

## 2015-11-08 DIAGNOSIS — C787 Secondary malignant neoplasm of liver and intrahepatic bile duct: Secondary | ICD-10-CM

## 2015-11-08 DIAGNOSIS — C7951 Secondary malignant neoplasm of bone: Secondary | ICD-10-CM

## 2015-11-08 DIAGNOSIS — E119 Type 2 diabetes mellitus without complications: Secondary | ICD-10-CM

## 2015-11-08 DIAGNOSIS — R531 Weakness: Secondary | ICD-10-CM

## 2015-11-08 DIAGNOSIS — R05 Cough: Secondary | ICD-10-CM

## 2015-11-08 DIAGNOSIS — R64 Cachexia: Secondary | ICD-10-CM

## 2015-11-08 LAB — CBC
HEMATOCRIT: 31.6 % — AB (ref 39.0–52.0)
Hemoglobin: 10.3 g/dL — ABNORMAL LOW (ref 13.0–17.0)
MCH: 26.6 pg (ref 26.0–34.0)
MCHC: 32.6 g/dL (ref 30.0–36.0)
MCV: 81.7 fL (ref 78.0–100.0)
PLATELETS: 405 10*3/uL — AB (ref 150–400)
RBC: 3.87 MIL/uL — ABNORMAL LOW (ref 4.22–5.81)
RDW: 15.9 % — AB (ref 11.5–15.5)
WBC: 16.4 10*3/uL — AB (ref 4.0–10.5)

## 2015-11-08 LAB — COMPREHENSIVE METABOLIC PANEL
ALT: 15 U/L — ABNORMAL LOW (ref 17–63)
AST: 17 U/L (ref 15–41)
Albumin: 2 g/dL — ABNORMAL LOW (ref 3.5–5.0)
Alkaline Phosphatase: 105 U/L (ref 38–126)
Anion gap: 6 (ref 5–15)
BILIRUBIN TOTAL: 0.8 mg/dL (ref 0.3–1.2)
BUN: 14 mg/dL (ref 6–20)
CO2: 24 mmol/L (ref 22–32)
CREATININE: 0.74 mg/dL (ref 0.61–1.24)
Calcium: 10 mg/dL (ref 8.9–10.3)
Chloride: 102 mmol/L (ref 101–111)
Glucose, Bld: 97 mg/dL (ref 65–99)
POTASSIUM: 3.6 mmol/L (ref 3.5–5.1)
Sodium: 132 mmol/L — ABNORMAL LOW (ref 135–145)
TOTAL PROTEIN: 5.7 g/dL — AB (ref 6.5–8.1)

## 2015-11-08 LAB — GLUCOSE, CAPILLARY
Glucose-Capillary: 105 mg/dL — ABNORMAL HIGH (ref 65–99)
Glucose-Capillary: 97 mg/dL (ref 65–99)

## 2015-11-08 LAB — CALCIUM, IONIZED: Calcium, Ionized, Serum: 6.5 mg/dL — ABNORMAL HIGH (ref 4.5–5.6)

## 2015-11-08 LAB — STREP PNEUMONIAE URINARY ANTIGEN: Strep Pneumo Urinary Antigen: NEGATIVE

## 2015-11-08 LAB — HEMOGLOBIN A1C
HEMOGLOBIN A1C: 5.6 % (ref 4.8–5.6)
MEAN PLASMA GLUCOSE: 114 mg/dL

## 2015-11-08 MED ORDER — LEVOFLOXACIN 500 MG PO TABS: 500.0000 mg | ORAL_TABLET | Freq: Every day | ORAL | Status: AC

## 2015-11-08 MED ORDER — MORPHINE (PF) INJECTION FOR INHALATION 10 MG/ML
10.0000 mg | RESPIRATORY_TRACT | Status: DC | PRN
  Administered 2015-11-08: 10 mg via RESPIRATORY_TRACT
  Filled 2015-11-08: qty 1

## 2015-11-08 MED ORDER — MORPHINE SULFATE (PF) 2 MG/ML IV SOLN
2.0000 mg | INTRAVENOUS | Status: DC | PRN
  Administered 2015-11-08 (×2): 2 mg via INTRAVENOUS
  Filled 2015-11-08 (×2): qty 1

## 2015-11-08 MED ORDER — FENTANYL 25 MCG/HR TD PT72
25.0000 ug | MEDICATED_PATCH | TRANSDERMAL | Status: DC
  Administered 2015-11-08: 25 ug via TRANSDERMAL
  Filled 2015-11-08: qty 1

## 2015-11-08 MED ORDER — ENSURE ENLIVE PO LIQD: 237.0000 mL | Freq: Three times a day (TID) | ORAL | Status: AC

## 2015-11-08 MED ORDER — ONDANSETRON HCL 4 MG/2ML IJ SOLN: 4.0000 mg | Freq: Four times a day (QID) | INTRAMUSCULAR | Status: AC | PRN

## 2015-11-08 MED ORDER — ALBUTEROL SULFATE (2.5 MG/3ML) 0.083% IN NEBU: 2.5000 mg | INHALATION_SOLUTION | RESPIRATORY_TRACT | Status: AC | PRN

## 2015-11-08 MED ORDER — MORPHINE SULFATE (PF) 2 MG/ML IV SOLN: 2.0000 mg | INTRAVENOUS | Status: AC | PRN

## 2015-11-08 MED ORDER — SENNOSIDES-DOCUSATE SODIUM 8.6-50 MG PO TABS: 1.0000 | ORAL_TABLET | Freq: Two times a day (BID) | ORAL | Status: AC

## 2015-11-08 MED ORDER — GABAPENTIN 100 MG PO CAPS: 100.0000 mg | ORAL_CAPSULE | Freq: Three times a day (TID) | ORAL | Status: AC

## 2015-11-08 MED ORDER — ACETAMINOPHEN 325 MG PO TABS: 650.0000 mg | ORAL_TABLET | Freq: Four times a day (QID) | ORAL | Status: AC | PRN

## 2015-11-08 MED ORDER — MORPHINE (PF) INJECTION FOR INHALATION 10 MG/ML: 10.0000 mg | RESPIRATORY_TRACT | Status: AC | PRN

## 2015-11-08 MED ORDER — FENTANYL 25 MCG/HR TD PT72: 25.0000 ug | MEDICATED_PATCH | TRANSDERMAL | Status: AC

## 2015-11-08 NOTE — Progress Notes (Signed)
Patient is set to discharge to [facility] today. Patient & family, Dustin Freeman and Dustin Freeman, aware. Discharge packet given to RN, Nira Conn. [facility] called for transport.   Kingsley Spittle, [place] Clinical Social Worker [phone]

## 2015-11-08 NOTE — Discharge Summary (Addendum)
Physician Discharge Summary  Dustin Freeman ZHY:[id number] DOB: 1955/12/05 DOA: 11/06/2015  PCP: No PCP Per Patient  Admit date: 11/06/2015 Discharge date: 11/08/2015  Admitted From: Home Disposition:  Residential Hospice)  Discharge Condition: Hospice CODE STATUS:Comfort Care  Brief/Interim Summary: Dustin Freeman is a 60 y.o. male with a past medical history significant for NIDDM, PVD with stent, and squamous lung cancer lost to follow up who presents with weakness and weight loss for 1 month.  The patient presents with about a month of weight loss, inability to sleep, weakess, and loss of energy. 2 weeks ago he syncopized. He has had increased shortness of breath, worsening cough productive of clear nonbloody sputum, and aching right leg pain. He has not had fever, chills, rigors. Also during this time he has had a rash appear on his left chest, with residual pain. The rash started as "scabs" and is now large flaking patches from his back wrapping around his chest. Because of this rash, he came to the ER.  He was started on antibiotics for presumed infectious pneumonia.  He was cleaned and cared for and all the bed bugs were removed from his body.    Progressive metastatic lung cell cancer  He clearly is not a candidate for any intervention at this point. He has widely metastatic disease. He has a very poor performance status.  His prognosis is less than 1 month.  I do not believe that any intervention is going to help him. I would not consider any radiation therapy for this post obstructive process. I would not put him through a bronchoscopy.  Our goal at this point is complete comfort. I just want him to have respect and dignity.  Arrangements have been made for him to go to [facility] for residential hospice care.  CAP (community acquired pneumonia) - Pt treated with IV antibiotics and will be discharged on 5 more days of levaquin.  Type 2 DM  - stable blood sugars - no  treatment needed.  Severe protein calorie malnutrition - boost energy supplements provided.  Anemia - hemoglobin stable. Pleural effusion - s/p thoracentesis for comfort, Pt tolerated well.   Discharge Diagnoses:  Principal Problem:   Weakness Active Problems:   Leukocytosis   Protein-calorie malnutrition, severe (HCC)   Hyponatremia   Hypercalcemia   Squamous cell carcinoma of right lung (HCC)   Anemia of chronic disease   PVD (peripheral vascular disease) (HCC)   CAP (community acquired pneumonia)   Type 2 diabetes mellitus ([place])   Anorexia   Community acquired pneumonia  Discharge Instructions     Medication List    STOP taking these medications        blood glucose meter kit and supplies Kit     clopidogrel 75 MG tablet  Commonly known as:  PLAVIX     pregabalin 75 MG capsule  Commonly known as:  LYRICA      TAKE these medications        acetaminophen 325 MG tablet  Commonly known as:  TYLENOL  Take 2 tablets (650 mg total) by mouth every 6 (six) hours as needed for mild pain (or Fever >/= 101).     albuterol (2.5 MG/3ML) 0.083% nebulizer solution  Commonly known as:  PROVENTIL  Take 3 mLs (2.5 mg total) by nebulization every 2 (two) hours as needed for wheezing.     feeding supplement (ENSURE ENLIVE) Liqd  Take 237 mLs by mouth 3 (three) times daily between meals.  fentaNYL 25 MCG/HR patch  Commonly known as:  DURAGESIC - dosed mcg/hr  Place 1 patch (25 mcg total) onto the skin every 3 (three) days.     gabapentin 100 MG capsule  Commonly known as:  NEURONTIN  Take 1 capsule (100 mg total) by mouth 3 (three) times daily.     levofloxacin 500 MG tablet  Commonly known as:  LEVAQUIN  Take 1 tablet (500 mg total) by mouth daily.     morphine 10 MG/ML Soln injection for inhalation  Commonly known as:  morphine sulfate  Inhale 1 mL (10 mg total) into the lungs every 4 (four) hours as needed for shortness of breath.     morphine 2 MG/ML injection   Inject 1 mL (2 mg total) into the vein every 2 (two) hours as needed.     ondansetron 4 MG/2ML Soln injection  Commonly known as:  ZOFRAN  Inject 2 mLs (4 mg total) into the vein every 6 (six) hours as needed for nausea.     senna-docusate 8.6-50 MG tablet  Commonly known as:  Senokot-S  Take 1 tablet by mouth 2 (two) times daily.        No Known Allergies  Consultations: oncology  Procedures/Studies: Dg Chest 2 View  11/07/2015  CLINICAL DATA:  Anorexia.  Lung cancer. EXAM: CHEST  2 VIEW COMPARISON:  06/30/2014 FINDINGS: Bilateral small pleural effusions. Greater opacity on the right due to postobstructive consolidation and volume loss in this patient with obstructing right lower lobe mass on prior CT imaging. Bilateral pulmonary nodules, 1 cm and smaller. Background hyperinflation. Normal heart size. Porta catheter on the left with tip in good position. IMPRESSION: 1. Limited assessment without recent imaging. 2. Left lower lobe airspace disease concerning for pneumonia. 3. Right basilar opacity related to postobstructive pneumonia and collapse on 2016 comparison. 4. Bilateral pulmonary nodules consistent with metastases in this setting. 5. Bilateral small pleural effusion. Electronically Signed   By: Monte Fantasia M.D.   On: 11/07/2015 01:34   Ct Chest W Contrast  11/07/2015  CLINICAL DATA:  Fever and cough for 1 month. Anorexia and vomiting. Right lung squamous cell carcinoma. Recent chemotherapy. EXAM: CT CHEST, ABDOMEN, AND PELVIS WITH CONTRAST TECHNIQUE: Multidetector CT imaging of the chest, abdomen and pelvis was performed following the standard protocol during bolus administration of intravenous contrast. CONTRAST:  65m ISOVUE-300 IOPAMIDOL (ISOVUE-300) INJECTION 61% COMPARISON:  Chest CT on 06/22/2014 and AP CT on 06/29/2014 FINDINGS: CT CHEST FINDINGS Cardiovascular: No acute findings. Aortic atherosclerosis and coronary artery calcification again noted. Mediastinum/Lymph  Nodes: Increased lymphadenopathy seen throughout the mediastinum. Index lymph node in the right paratracheal region measures 2.0 cm short axis on image 31/series 2 compared to 10 mm previously. New mild left hilar lymphadenopathy is also seen. Lungs/Pleura: Increased size of right infrahilar mass is demonstrated, currently measuring approximately 4.3 x 7.6 cm compared to 3.9 x 5.1 cm previously. This now obstructs the left mainstem bronchus, and there is increased postobstructive pneumonitis in the right middle and lower lobes. Small right pleural effusion again noted. Moderate right pleural effusion shows no significant change compared to most recent abdomen CT. Severe emphysema is again seen bilaterally. Numerous new pulmonary nodules are seen in both lungs, left side greater than right, largest measuring approximately 1.5 cm. These are consistent with diffuse pulmonary metastases. Musculoskeletal: Lytic bone metastasis involving the left lamina of T1. CT ABDOMEN PELVIS FINDINGS Hepatobiliary: Numerous small hypovascular masses are seen throughout the right and left hepatic lobes,  which are new and consistent with diffuse liver metastases. Pancreas: No mass, inflammatory changes, or other significant abnormality. Spleen: Within normal limits in size and appearance. Adrenals/Urinary Tract: New 2.3 cm left adrenal mass is consistent with adrenal metastasis. There are multiple new ill-defined soft tissue masses involving both kidneys, which are suspicious for bilateral renal metastases. No evidence of hydronephrosis. Stomach/Bowel: No evidence of obstruction, inflammatory process, or abnormal fluid collections. Vascular/Lymphatic: New abdominal lymphadenopathy is seen in the upper abdomen in the gastrohepatic and hepatoduodenal ligaments as well as the aorta caval and left paraaortic spaces. Index lymph node in left paraaortic region measures 2.6 cm on image 39/2. Mild lymphadenopathy is seen in the left common iliac  chain measuring up to 10 mm on image 96/series 2, however no other pelvic lymphadenopathy identified. Reproductive: Normal size prostate. Other: Mild ascites and diffuse body wall edema shows mild decrease since previous study. Musculoskeletal: New lytic bone metastases are seen involving the right posterior ilium, L4 vertebral body, and right transverse process of L3. IMPRESSION: Increased size of right infrahilar lung mass which now obstructs the right mainstem bronchus, with increased postobstructive pneumonitis throughout the right middle and lower lobes. Stable small right pleural effusion. Marked interval progression of metastatic disease within the mediastinum and left hilum, both lungs, liver, left adrenal gland, bilateral kidneys, abdominal and retroperitoneal lymphadenopathy, and bone metastases involving the spine and pelvis, as detailed above. Mild decrease in ascites and diffuse body wall edema compared to prior study. Stable bilateral pleural effusions. Electronically Signed   By: Earle Gell M.D.   On: 11/07/2015 15:19   Ct Abdomen Pelvis W Contrast  11/07/2015  CLINICAL DATA:  Fever and cough for 1 month. Anorexia and vomiting. Right lung squamous cell carcinoma. Recent chemotherapy. EXAM: CT CHEST, ABDOMEN, AND PELVIS WITH CONTRAST TECHNIQUE: Multidetector CT imaging of the chest, abdomen and pelvis was performed following the standard protocol during bolus administration of intravenous contrast. CONTRAST:  23m ISOVUE-300 IOPAMIDOL (ISOVUE-300) INJECTION 61% COMPARISON:  Chest CT on 06/22/2014 and AP CT on 06/29/2014 FINDINGS: CT CHEST FINDINGS Cardiovascular: No acute findings. Aortic atherosclerosis and coronary artery calcification again noted. Mediastinum/Lymph Nodes: Increased lymphadenopathy seen throughout the mediastinum. Index lymph node in the right paratracheal region measures 2.0 cm short axis on image 31/series 2 compared to 10 mm previously. New mild left hilar lymphadenopathy is  also seen. Lungs/Pleura: Increased size of right infrahilar mass is demonstrated, currently measuring approximately 4.3 x 7.6 cm compared to 3.9 x 5.1 cm previously. This now obstructs the left mainstem bronchus, and there is increased postobstructive pneumonitis in the right middle and lower lobes. Small right pleural effusion again noted. Moderate right pleural effusion shows no significant change compared to most recent abdomen CT. Severe emphysema is again seen bilaterally. Numerous new pulmonary nodules are seen in both lungs, left side greater than right, largest measuring approximately 1.5 cm. These are consistent with diffuse pulmonary metastases. Musculoskeletal: Lytic bone metastasis involving the left lamina of T1. CT ABDOMEN PELVIS FINDINGS Hepatobiliary: Numerous small hypovascular masses are seen throughout the right and left hepatic lobes, which are new and consistent with diffuse liver metastases. Pancreas: No mass, inflammatory changes, or other significant abnormality. Spleen: Within normal limits in size and appearance. Adrenals/Urinary Tract: New 2.3 cm left adrenal mass is consistent with adrenal metastasis. There are multiple new ill-defined soft tissue masses involving both kidneys, which are suspicious for bilateral renal metastases. No evidence of hydronephrosis. Stomach/Bowel: No evidence of obstruction, inflammatory process, or abnormal fluid  collections. Vascular/Lymphatic: New abdominal lymphadenopathy is seen in the upper abdomen in the gastrohepatic and hepatoduodenal ligaments as well as the aorta caval and left paraaortic spaces. Index lymph node in left paraaortic region measures 2.6 cm on image 39/2. Mild lymphadenopathy is seen in the left common iliac chain measuring up to 10 mm on image 96/series 2, however no other pelvic lymphadenopathy identified. Reproductive: Normal size prostate. Other: Mild ascites and diffuse body wall edema shows mild decrease since previous study.  Musculoskeletal: New lytic bone metastases are seen involving the right posterior ilium, L4 vertebral body, and right transverse process of L3. IMPRESSION: Increased size of right infrahilar lung mass which now obstructs the right mainstem bronchus, with increased postobstructive pneumonitis throughout the right middle and lower lobes. Stable small right pleural effusion. Marked interval progression of metastatic disease within the mediastinum and left hilum, both lungs, liver, left adrenal gland, bilateral kidneys, abdominal and retroperitoneal lymphadenopathy, and bone metastases involving the spine and pelvis, as detailed above. Mild decrease in ascites and diffuse body wall edema compared to prior study. Stable bilateral pleural effusions. Electronically Signed   By: Earle Gell M.D.   On: 11/07/2015 15:19   US Thoracentesis Asp Pleural Space W/img Guide  11/08/2015  INDICATION: History of squamous cell carcinoma of the right lung with recurrent left pleural effusion. Request is made for therapeutic thoracentesis. EXAM: ULTRASOUND GUIDED THERAPEUTIC THORACENTESIS MEDICATIONS: 1% lidocaine. COMPLICATIONS: None immediate. PROCEDURE: An ultrasound guided thoracentesis was thoroughly discussed with the patient and questions answered. The benefits, risks, alternatives and complications were also discussed. The patient understands and wishes to proceed with the procedure. Written consent was obtained. Ultrasound was performed to localize and mark an adequate pocket of fluid in the left chest. The area was then prepped and draped in the normal sterile fashion. 1% Lidocaine was used for local anesthesia. Under ultrasound guidance a Safe-T-Centesis catheter was introduced. Thoracentesis was performed. The catheter was removed and a dressing applied. FINDINGS: A total of approximately 0.9 L of clear yellow fluid was removed. IMPRESSION: Successful ultrasound guided left thoracentesis yielding 0.9 L of pleural fluid.  Read by: Saverio Danker, PA-C Electronically Signed   By: Jacqulynn Cadet M.D.   On: 11/08/2015 11:48     Subjective: Pt appears comfortable now.   Discharge Exam: Filed Vitals:   11/08/15 1125 11/08/15 1140  BP: 109/65 97/69  Pulse:    Temp:    Resp:     Filed Vitals:   11/08/15 0554 11/08/15 0739 11/08/15 1125 11/08/15 1140  BP: 128/87  109/65 97/69  Pulse: 106     Temp: 99.3 F (37.4 C)     TempSrc: Oral     Resp: 20     Height:      Weight:      SpO2: 99% 97%     General appearance: Cachectic elderly male, alert and in mild distress from fatigue.  Eyes: Conjunctiva normal, lids and lashes normal. PERRL.  ENT: No nasal deformity, discharge. OP dry without lesions.  Lymph: No cervical or supraclavicular lymphadenopathy. Skin: Warm and dry. There is flaking in large patches in a dermatomal pattern on his left chest and he notes this is painful. He states that it started as scabs, similar to scabs on his right shoulder. He has a dead cimex lenticularis in the waistband of his pants. Cardiac: Tachycardic, regular, nl S1-S2, no murmurs appreciated. Capillary refill is brisk. JVP normal. No LE edema. Radial and DP pulses 2+ and symmetric. Respiratory: Normal  respiratory rate and rhythm. Coarse rales bilaterally in all lung fields, no wheezes. GI: Abdomen soft without rigidity. Non focal TTP with guarding. No ascites, distension. Not able to appreciate hepatosplenomegaly.  MSK: No deformities or effusions. No clubbing/cyanosis, but darkening of nails. Neuro: Cranial nerves normal. Sensorium intact and responding to questions, attention normal. Speech is fluent. Moves all extremities equally and with normal coordination butglobally weak.  Psych: Affect blunted. Judgment and insight appear normal.    The results of significant diagnostics from this hospitalization (including imaging, microbiology, ancillary and laboratory) are listed below for reference.      Microbiology: No results found for this or any previous visit (from the past 240 hour(s)).   Labs: BNP (last 3 results) No results for input(s): BNP in the last 8760 hours. Basic Metabolic Panel:  Recent Labs Lab 11/07/15 0019 11/08/15 0341  NA 134* 132*  K 4.0 3.6  CL 100* 102  CO2 28 24  GLUCOSE 99 97  BUN 15 14  CREATININE 0.95 0.74  CALCIUM 10.8* 10.0   Liver Function Tests:  Recent Labs Lab 11/07/15 0019 11/08/15 0341  AST 19 17  ALT 14* 15*  ALKPHOS 114 105  BILITOT 1.3* 0.8  PROT 6.2* 5.7*  ALBUMIN 2.2* 2.0*   No results for input(s): LIPASE, AMYLASE in the last 168 hours. No results for input(s): AMMONIA in the last 168 hours. CBC:  Recent Labs Lab 11/07/15 0019 11/08/15 0341  WBC 14.7* 16.4*  NEUTROABS 12.5*  --   HGB 10.7* 10.3*  HCT 32.5* 31.6*  MCV 80.8 81.7  PLT 386 405*   Cardiac Enzymes: No results for input(s): CKTOTAL, CKMB, CKMBINDEX, TROPONINI in the last 168 hours. BNP: Invalid input(s): POCBNP CBG:  Recent Labs Lab 11/07/15 1246 11/07/15 1737 11/07/15 2055 11/08/15 0952 11/08/15 1207  GLUCAP 101* 88 111* 105* 97   D-Dimer No results for input(s): DDIMER in the last 72 hours. Hgb A1c  Recent Labs  11/07/15 0715  HGBA1C 5.6   Lipid Profile No results for input(s): CHOL, HDL, LDLCALC, TRIG, CHOLHDL, LDLDIRECT in the last 72 hours. Thyroid function studies No results for input(s): TSH, T4TOTAL, T3FREE, THYROIDAB in the last 72 hours.  Invalid input(s): FREET3 Anemia work up No results for input(s): VITAMINB12, FOLATE, FERRITIN, TIBC, IRON, RETICCTPCT in the last 72 hours. Urinalysis    Component Value Date/Time   COLORURINE AMBER* 11/07/2015 0230   APPEARANCEUR CLOUDY* 11/07/2015 0230   LABSPEC 1.017 11/07/2015 0230   PHURINE 6.5 11/07/2015 0230   GLUCOSEU NEGATIVE 11/07/2015 0230   HGBUR NEGATIVE 11/07/2015 0230   BILIRUBINUR NEGATIVE 11/07/2015 0230   KETONESUR NEGATIVE 11/07/2015 0230   PROTEINUR  NEGATIVE 11/07/2015 0230   UROBILINOGEN 4.0* 06/20/2014 1412   NITRITE NEGATIVE 11/07/2015 0230   LEUKOCYTESUR NEGATIVE 11/07/2015 0230   Sepsis Labs Invalid input(s): PROCALCITONIN,  WBC,  LACTICIDVEN Microbiology No results found for this or any previous visit (from the past 240 hour(s)).  Time coordinating discharge: 27 minutes  SIGNED:  Irwin Brakeman, MD  [facility] 11/08/2015, 12:21 PM Pager   If 7PM-7AM, please contact night-coverage [email or web address] Password [email or web address]

## 2015-11-08 NOTE — Consult Note (Signed)
[facility] Received request from [facility] for family interest in [facility]. Chart reviewed and met with patient and spouse to complete paper work for transfer today. Dr. Marin Olp to assume care per family request.   Discharge summary has been faxed.   RN please call report to [phone].  Thank you,  Erling Conte, LCSW [phone]

## 2015-11-08 NOTE — Discharge Instructions (Signed)
Hospice °Hospice is a service that is designed to provide people who are terminally ill and their families with medical, spiritual, and psychological support. Its aim is to improve your quality of life by keeping you as alert and comfortable as possible. Hospice is performed by a team of health care professionals and volunteers who: °· Help keep you comfortable. Hospice can be provided in your home or in a homelike setting. The hospice staff works with your family and friends to help meet your needs. You will enjoy the support of loved ones by receiving much of your basic care from family and friends. °· Provide pain relief and manage your symptoms. The staff supply all necessary medicines and equipment. °· Provide companionship when you are alone. °· Allow you and your family to rest. They may do light housekeeping, prepare meals, and run errands. °· Provide counseling. They will make sure your emotional, spiritual, and social needs and those of your family are being met. °· Provide spiritual care. Spiritual care is individualized to meet your needs and your family's needs. It may involve helping you look at what death means to you, say goodbye, or perform a specific religious ceremony or ritual. °Hospice teams often include: °· A nurse. °· A doctor. °· Social workers. °· Religious leaders (such as a chaplain). °· Trained volunteers. °WHEN SHOULD HOSPICE CARE BEGIN? °Most people who use hospice are believed to have fewer than 6 months to live. Your family and health care providers can help you decide when hospice services should begin. If your condition improves, you may discontinue the program. °WHAT SHOULD I CONSIDER BEFORE SELECTING A PROGRAM? °Most hospice programs are run by nonprofit, independent organizations. Some are affiliated with hospitals, nursing homes, or home health care agencies. Hospice programs can take place in the home or at a hospice center, hospital, or skilled nursing facility. When choosing  a hospice program, ask the following questions: °· What services are available to me? °· What services are offered to my loved ones? °· How involved are my loved ones? °· How involved is my health care provider? °· Who makes up the hospice care team? How are they trained or screened? °· How will my pain and symptoms be managed? °· If my circumstances change, can the services be provided in a different setting, such as my home or in the hospital? °· Is the program reviewed and licensed by the state or certified in some other way? °WHERE CAN I LEARN MORE ABOUT HOSPICE? °You can learn about existing hospice programs in your area from your health care providers. You can also read more about hospice online. The websites of the following organizations contain helpful information: °· The National Hospice and Palliative Care Organization (NHPCO). °· The Hospice Association of America (HAA). °· The Hospice Education Institute. °· The American Cancer Society (ACS). °· Hospice Net. °  °This information is not intended to replace advice given to you by your health care provider. Make sure you discuss any questions you have with your health care provider. °  °Document Released: 07/24/2003 Document Revised: 04/11/2013 Document Reviewed: 02/14/2013 °Elsevier Interactive Patient Education ©2016 [employer]. ° °

## 2015-11-08 NOTE — Procedures (Signed)
Ultrasound-guided therapeutic left thoracentesis performed yielding 0.9 liters of yellow colored fluid. No immediate complications. Follow-up chest x-ray pending.       Kasra Melvin E 11:43 AM 11/08/2015

## 2015-11-08 NOTE — Consult Note (Signed)
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 Rash  . Anorexia  : I'm very weak.  HPI: [name] is known to me. I did not see him for 16 months. I last saw her back in March 2016. At that point time, he presented with locally advanced squamous cell carcinoma the right lung. He got radiation therapy. He received 6000 rad. He completed this back in early May.  We cannot get him back to our office. He was lost to follow-up.  He now presents with marked cachexia. He has weakness. He was [place] with a cough. Had a little bit of fever.  He clearly has had marked tumor progression now. A CT scan was done which showed a postobstructive process. He has a right infrahilar mass. This now obstructs the right mainstem bronchus. There is postobstructive pneumonia in the right middle and lower lobes. He has a moderate left pleural effusion. He has marked hepatic metastasis. He has lytic bone metastases.  He is quite cachectic. He has a living on his own I think. He says that he has been in contact with his brother and sister.  It is obvious that he has a very short prognosis.  He is not coughing up any blood. He has a cough. I think a morphine nebulizer will help him.  I suspect that he will also need a Duragesic patch. I think this will help with some pain issues.  He's had no diarrhea. He's had no nausea or vomiting.  Overall, his performance status is ECOG 3-4    Past Medical History  Diagnosis Date  . History of palpitations     During [employer] intake exam, not since  . DM (diabetes mellitus) ([place])   . PVD (peripheral vascular disease) ([facility])     s/p stent to right external iliac artery (05/15/14 - Dr. Donnetta Hutching)  . Collagen vascular disease ([place])   . Cancer [facility])   :  Past Surgical History  Procedure Laterality Date  . Lower extremity angiogram N/A 05/15/2014    Procedure: LOWER EXTREMITY ANGIOGRAM;   Surgeon: Serafina Mitchell, MD;  Location: [facility] CATH LAB;  Service: Cardiovascular;  Laterality: N/A;  . Video bronchoscopy Bilateral 06/28/2014    Procedure: VIDEO BRONCHOSCOPY WITHOUT FLUORO;  Surgeon: Rush Farmer, MD;  Location: [facility] ENDOSCOPY;  Service: Endoscopy;  Laterality: Bilateral;  :   Current facility-administered medications:  .  0.9 %  sodium chloride infusion, , Intravenous, Continuous, Edwin Dada, MD, Last Rate: 75 mL/hr at 11/07/15 2117 .  acetaminophen (TYLENOL) tablet 650 mg, 650 mg, Oral, Q6H PRN **OR** acetaminophen (TYLENOL) suppository 650 mg, 650 mg, Rectal, Q6H PRN, Edwin Dada, MD .  albuterol (PROVENTIL) (2.5 MG/3ML) 0.083% nebulizer solution 2.5 mg, 2.5 mg, Nebulization, Q2H PRN, Edwin Dada, MD .  azithromycin (ZITHROMAX) 500 mg in dextrose 5 % 250 mL IVPB, 500 mg, Intravenous, Q24H, Edwin Dada, MD, 500 mg at 11/07/15 2255 .  cefTRIAXone (ROCEPHIN) 1 g in dextrose 5 % 50 mL IVPB, 1 g, Intravenous, Q24H, Edwin Dada, MD, 1 g at 11/07/15 2113 .  diatrizoate meglumine-sodium (GASTROGRAFIN) 66-10 % solution 15 mL, 15 mL, Oral, PRN, Edwin Dada, MD .  enoxaparin (LOVENOX) injection 40 mg, 40 mg, Subcutaneous, Q24H, Edwin Dada, MD, 40 mg at 11/07/15 1048 .  feeding supplement (ENSURE ENLIVE) (ENSURE ENLIVE) liquid 237 mL, 237 mL, Oral, TID BM,  Clanford Marisa Hua, MD, 237 mL at 11/07/15 1742 .  fentaNYL (DURAGESIC - dosed mcg/hr) patch 25 mcg, 25 mcg, Transdermal, Q72H, [name], MD .  gabapentin (NEURONTIN) capsule 100 mg, 100 mg, Oral, TID, Acquanetta Chain, DO, 100 mg at 11/07/15 2111 .  insulin aspart (novoLOG) injection 0-5 Units, 0-5 Units, Subcutaneous, QHS, Edwin Dada, MD, 0 Units at 11/07/15 2200 .  insulin aspart (novoLOG) injection 0-9 Units, 0-9 Units, Subcutaneous, TID WC, Edwin Dada, MD, 0 Units at 11/07/15 0849 .  morphine (morphine sulfate) injection for  inhalation 10 mg, 10 mg, Inhalation, Q4H PRN, [name], MD .  morphine 2 MG/ML injection 2 mg, 2 mg, Intravenous, Q2H PRN, [name], MD .  ondansetron (ZOFRAN) tablet 4 mg, 4 mg, Oral, Q6H PRN **OR** ondansetron (ZOFRAN) injection 4 mg, 4 mg, Intravenous, Q6H PRN, Edwin Dada, MD .  oxyCODONE (Oxy IR/ROXICODONE) immediate release tablet 5 mg, 5 mg, Oral, Q4H PRN, Edwin Dada, MD .  senna-docusate (Senokot-S) tablet 1 tablet, 1 tablet, Oral, QHS PRN, Edwin Dada, MD .  traZODone (DESYREL) tablet 50 mg, 50 mg, Oral, QHS, Acquanetta Chain, DO, 50 mg at 11/07/15 2111:  . azithromycin  500 mg Intravenous Q24H  . cefTRIAXone (ROCEPHIN)  IV  1 g Intravenous Q24H  . enoxaparin (LOVENOX) injection  40 mg Subcutaneous Q24H  . feeding supplement (ENSURE ENLIVE)  237 mL Oral TID BM  . fentaNYL  25 mcg Transdermal Q72H  . gabapentin  100 mg Oral TID  . insulin aspart  0-5 Units Subcutaneous QHS  . insulin aspart  0-9 Units Subcutaneous TID WC  . traZODone  50 mg Oral QHS  :  No Known Allergies:  Family History  Problem Relation Age of Onset  . Diabetes Mellitus I Mother   . Peripheral vascular disease Father   . Leukemia Maternal Grandfather   . Leukemia Maternal Uncle   . Kidney disease Cousin   . Breast cancer Cousin   :  Social History   Social History  . Marital Status: Married    Spouse Name: N/A  . Number of Children: N/A  . Years of Education: N/A   Occupational History  . Not on file.   Social History Main Topics  . Smoking status: Former Smoker -- 0.50 packs/day for 47 years    Types: Cigarettes    Quit date: 05/22/2014  . Smokeless tobacco: Not on file  . Alcohol Use: No  . Drug Use: No  . Sexual Activity: Not on file   Other Topics Concern  . Not on file   Social History Narrative   Works at [employer], lives with wife     :  Pertinent items are noted in HPI.  Exam: Patient Vitals for the past 24 hrs:  BP Temp  Temp src Pulse Resp SpO2  11/08/15 0554 128/87 mmHg 99.3 F (37.4 C) Oral (!) 106 20 99 %  11/07/15 2053 132/89 mmHg 97.9 F (36.6 C) Oral (!) 101 (!) 22 100 %  11/07/15 1500 123/72 mmHg 97.8 F (36.6 C) Oral 97 20 100 %    Cachectic white male in no obvious distress. Head exam shows temporal muscle wasting. He has no oral lesions. He has no adenopathy in the neck. Lungs show rhonchi and wheezes bilaterally. Cardiac exam is tachycardic but regular area he has no murmurs. Abdomen is soft. There may be some slight fluid wave noted. There is no obvious hepatomegaly. Extremities  shows most likely an upper lower extremities. Skin exam is somewhat dry. Neurological exam shows no obvious neurological deficits.    Recent Labs  11/07/15 0019 11/08/15 0341  WBC 14.7* 16.4*  HGB 10.7* 10.3*  HCT 32.5* 31.6*  PLT 386 405*    Recent Labs  11/07/15 0019 11/08/15 0341  NA 134* 132*  K 4.0 3.6  CL 100* 102  CO2 28 24  GLUCOSE 99 97  BUN 15 14  CREATININE 0.95 0.74  CALCIUM 10.8* 10.0    Blood smear review:  None  Pathology: None     Assessment and Plan:  Mr. Lile is a 60 year old white male. He actually has done much, much better than I would've thought since it has been 16 months since we saw him. The radiation that he had most of help for a little bit.  He clearly is not a candidate for any intervention at this point. He has widely metastatic disease. He has a very poor performance status.  I really believe that his prognosis is easily less than 1 month.  I do not believe that any intervention is going to help him. I would not consider any radiation therapy for this post obstructive process. I would not put him through a bronchoscopy.  Our goal at this point is complete comfort. I just want him to have respect and dignity. He deserves this. He is very nice. It is obvious that he has had a very tough life. I'm glad that he has made contact with her brother and sister.  I think  the best, and only option for him is getting him to [facility]. They can really care for him there. Again, I suspect that his prognosis is easily less than 1 month.  While he is in the hospital, I would 1 a make sure that he has adequate comfort. I will start him on a Duragesic patch. I think morphine nebulizers will help him.  He understands very well that his prognosis is limited. I talked him for about 45 minutes. He is very accepting of his situation. He just does not want to burden anybody.  He is very much agreeable to [employer].  I think that he probably should be in the hospital over the weekend. His sister apparently is coming in.  It was nice to see him. We had good fellowship.  Again, the fact that his been 16 months since I seen him and it has been over a year since he has had any treatment, I think this is incredible.  I know that he will "win" in this situation. He knows this also.  We will follow along and help out in any way that we can. I appreciate the one full care that he is getting from everybody up on [address]. I appreciate the expert consultation from Dr. Hilma Favors of palliative care.  Lum Keas  2 Timothy 4:6-8

## 2015-11-08 NOTE — Progress Notes (Signed)
Report called to Levada Dy at [facility]. Awaiting PTAR.

## 2015-11-11 LAB — LEGIONELLA PNEUMOPHILA SEROGP 1 UR AG: L. pneumophila Serogp 1 Ur Ag: NEGATIVE

## 2015-11-19 DEATH — deceased

## 2015-11-20 ENCOUNTER — Ambulatory Visit: Admission: RE | Admit: 2015-11-20 | Payer: Medicaid Other | Source: Ambulatory Visit | Admitting: Radiation Oncology

## 2016-01-13 IMAGING — MR MR HEAD WO/W CM
11 of 13 series · 29 of 48 positions shown · IV contrast (multihance)
Comparison: Noncontrast brain MRI 05/14/2014

CLINICAL DATA: Lung cancer staging.

EXAM:
MRI HEAD WITHOUT AND WITH CONTRAST
TECHNIQUE: Multiplanar, multiecho pulse sequences of the brain and surrounding
structures were obtained without and with intravenous contrast.
CONTRAST:  10mL MULTIHANCE GADOBENATE DIMEGLUMINE 529 MG/ML IV SOLN

[Series 3: T1 · sagittal · 3.0mm · 0.47mm/px · 1 of 34 slices shown]
[im 1/34]
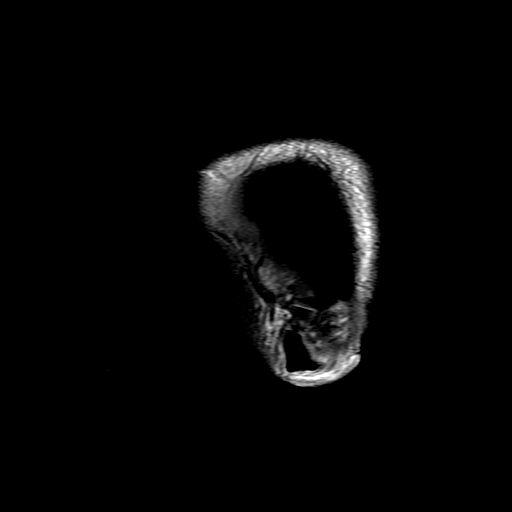

[Series 4: DWI · axial · 3.0mm · 1.09mm/px · z∈[-97,+41]mm · 5 of 90 slices shown (1 of 4)]
[im 1/90]
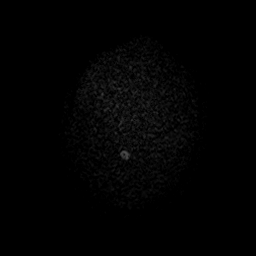
[im 23/90]
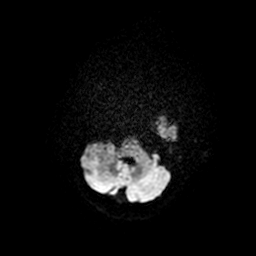
[im 45/90]
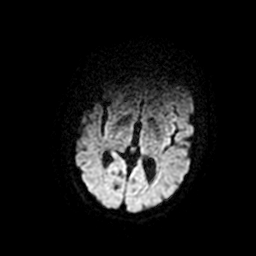
[im 67/90]
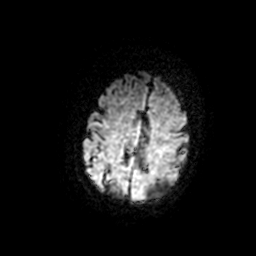
[im 90/90]
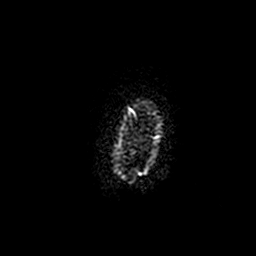

[Series 5: DWI · coronal · 5.0mm · 1.09mm/px · 4 of 72 slices shown (2 of 4)]
[im 1/72]
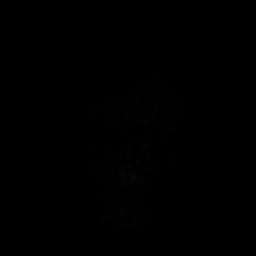
[im 24/72]
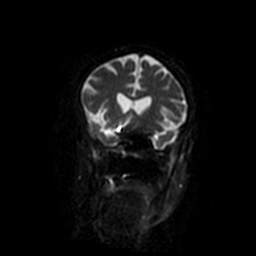
[im 48/72]
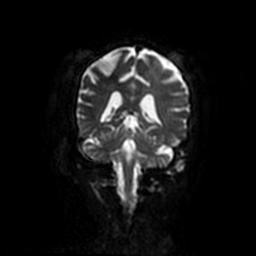
[im 72/72]
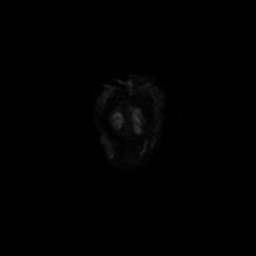

[Series 6: T2 · axial · 5.0mm · 0.43mm/px · z∈[-81,+68]mm · 2 of 28 slices shown]
[im 1/28]
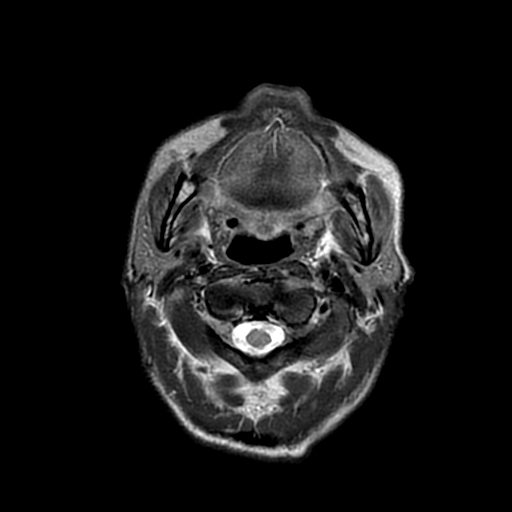
[im 28/28]
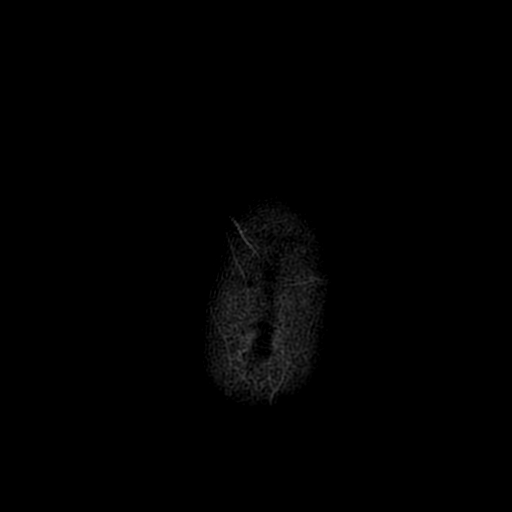

[Series 7: ax mpgr · axial · 5.0mm · 0.43mm/px · 1 of 28 slices shown]
[im 1/28]
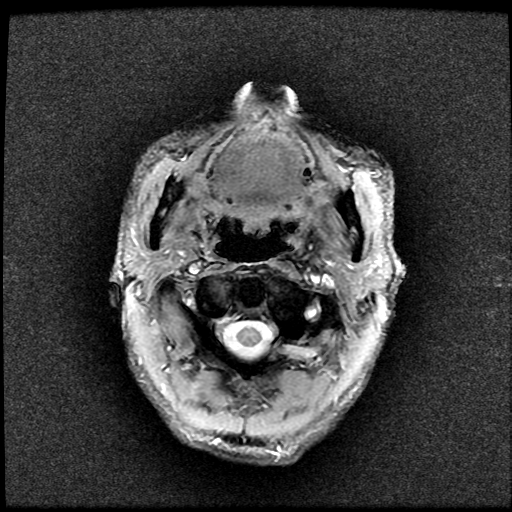

[Series 8: FLAIR · axial · 1.0mm · 0.47mm/px · z∈[-84,+67]mm · 5 of 83 slices shown]
[im 1/83]
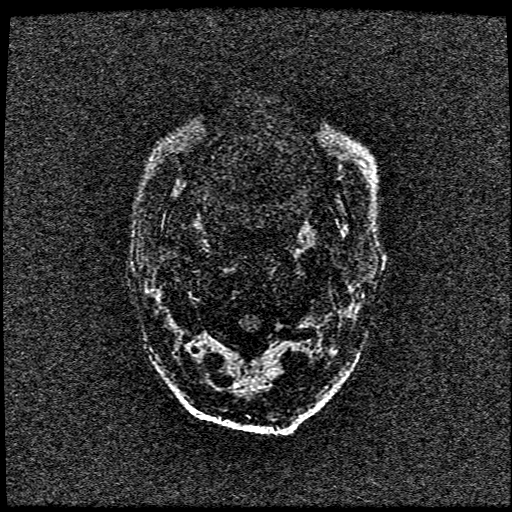
[im 21/83]
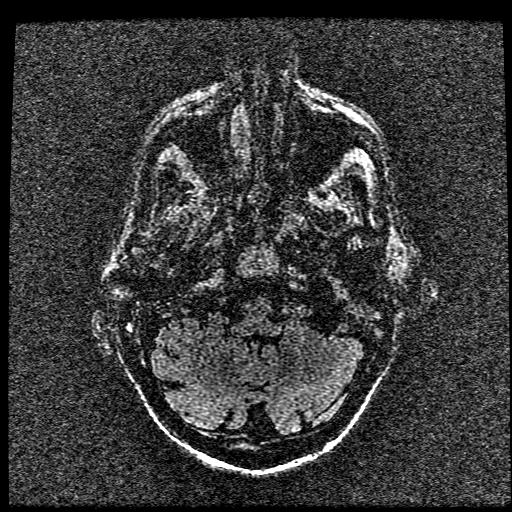
[im 42/83]
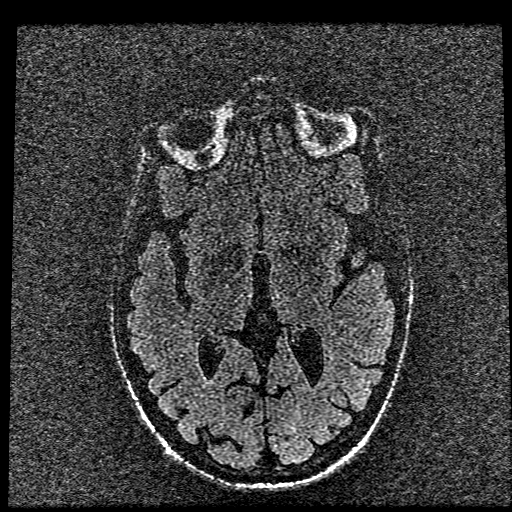
[im 62/83]
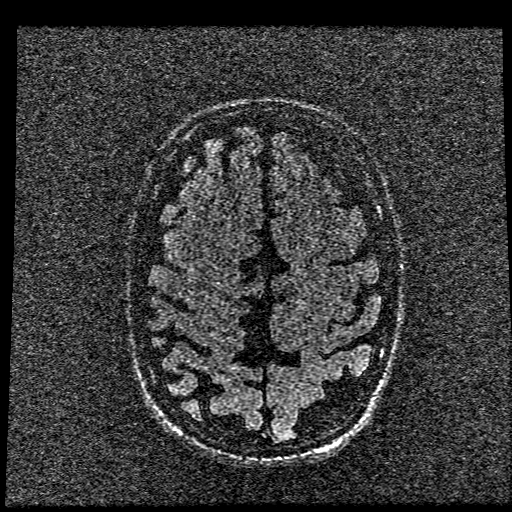
[im 83/83]
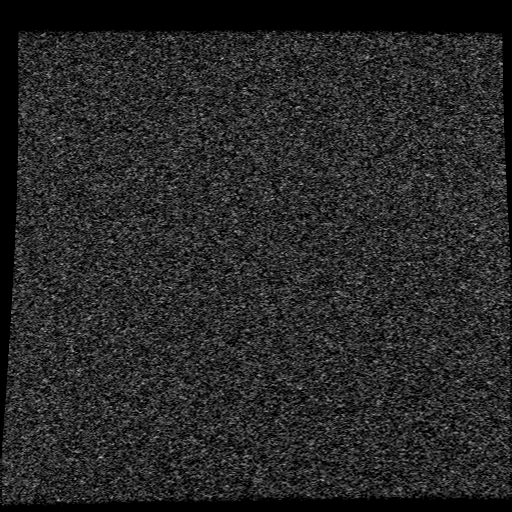

[Series 10: T2 post-contrast · coronal · 3.0mm · 0.47mm/px · 3 of 47 slices shown]
[im 1/47]
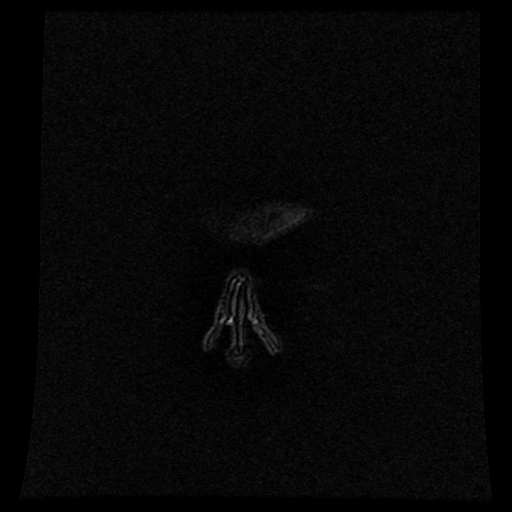
[im 24/47]
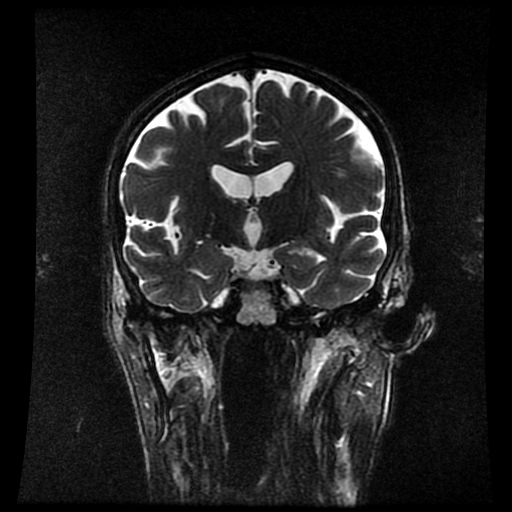
[im 47/47]
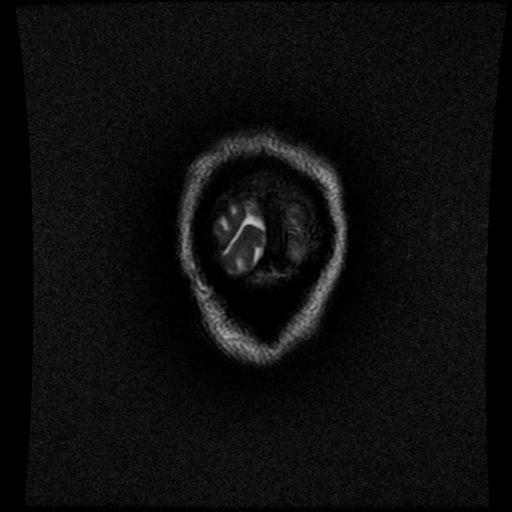

[Series 12: T1 post-contrast · coronal · 3.0mm · 0.47mm/px · 1 of 24 slices shown (1 of 2)]
[im 1/24]
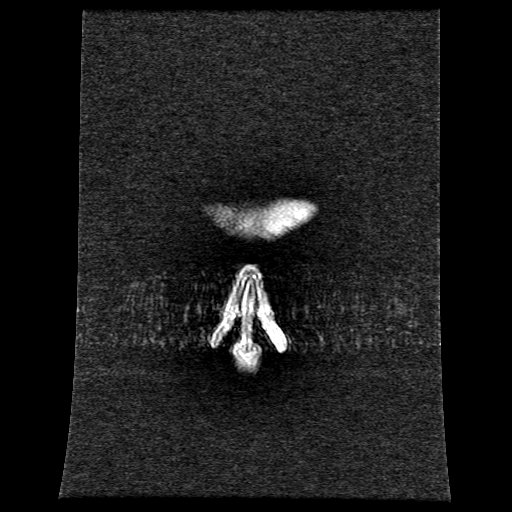

[Series 13: T1 post-contrast · sagittal · 3.0mm · 0.47mm/px · 2 of 34 slices shown (2 of 2)]
[im 1/34]
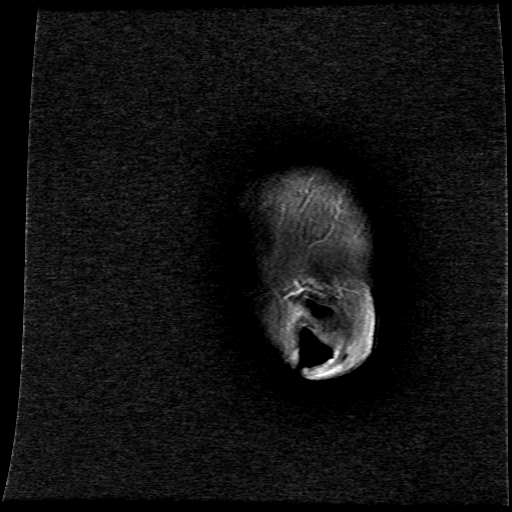
[im 34/34]
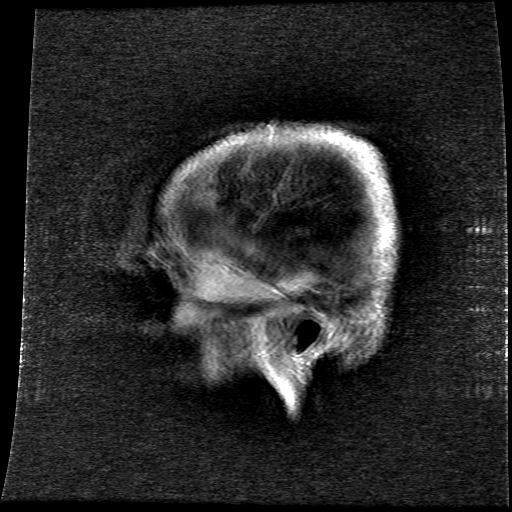

[Series 400: DWI · axial · 3.0mm · 1.09mm/px · z∈[-97,+41]mm · 3 of 45 slices shown (3 of 4)]
[im 1/45]
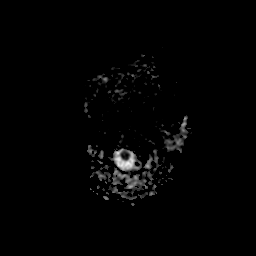
[im 23/45]
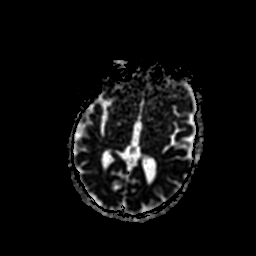
[im 45/45]
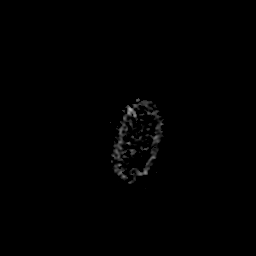

[Series 500: DWI · coronal · 5.0mm · 1.09mm/px · 2 of 36 slices shown (4 of 4)]
[im 1/36]
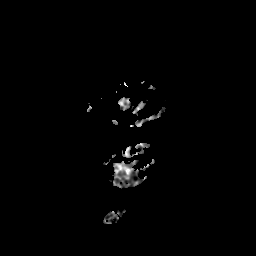
[im 36/36]
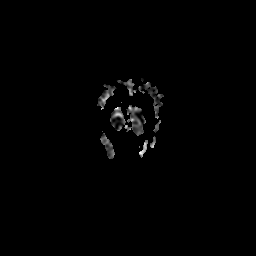

[29 of 48 positions shown; findings below may reference images not displayed]

FINDINGS: Images are mildly degraded by motion artifact.

There is no evidence of acute infarct, intracranial hemorrhage,
mass, midline shift, or extra-axial fluid collection. There is mild
generalized cerebral atrophy. No significant white matter disease is
identified. Mild enlargement of the cisterna magna is again noted.
No abnormal enhancement is identified, although very small lesions
could potentially be obscured given the motion artifact.

No suspicious osseous lesions are identified. Orbits are
unremarkable. Mild mucosal thickening is present in the ethmoid air
cells, maxillary sinuses, and right sphenoid sinus. There are
moderately large bilateral mastoid effusions. Major intracranial
vascular flow voids are preserved with the distal right vertebral
artery again appearing hypoplastic.
IMPRESSION: 1. No evidence of intracranial metastases.
2. Bilateral mastoid effusions.
